# Patient Record
Sex: Male | Born: 1960
Health system: Southern US, Community
[De-identification: ages and names within clinical notes are randomized; demographics above are authoritative.]

## PROBLEM LIST (undated history)

## (undated) DIAGNOSIS — E119 Type 2 diabetes mellitus without complications: Secondary | ICD-10-CM

## (undated) DIAGNOSIS — I509 Heart failure, unspecified: Secondary | ICD-10-CM

## (undated) DIAGNOSIS — J189 Pneumonia, unspecified organism: Secondary | ICD-10-CM

## (undated) HISTORY — PX: FRACTURE SURGERY: SHX138

---

## 2001-03-24 ENCOUNTER — Ambulatory Visit (HOSPITAL_COMMUNITY): Admission: RE | Admit: 2001-03-24 | Discharge: 2001-03-24 | Payer: Self-pay | Admitting: Pulmonary Disease

## 2001-04-14 ENCOUNTER — Ambulatory Visit (HOSPITAL_COMMUNITY): Admission: RE | Admit: 2001-04-14 | Discharge: 2001-04-14 | Payer: Self-pay | Admitting: Pulmonary Disease

## 2006-03-14 ENCOUNTER — Emergency Department (HOSPITAL_COMMUNITY): Admission: EM | Admit: 2006-03-14 | Discharge: 2006-03-14 | Payer: Self-pay | Admitting: Emergency Medicine

## 2006-03-20 ENCOUNTER — Ambulatory Visit: Payer: Self-pay | Admitting: Orthopedic Surgery

## 2008-07-09 ENCOUNTER — Emergency Department (HOSPITAL_COMMUNITY): Admission: EM | Admit: 2008-07-09 | Discharge: 2008-07-09 | Payer: Self-pay | Admitting: Emergency Medicine

## 2008-07-12 ENCOUNTER — Ambulatory Visit: Payer: Self-pay | Admitting: Orthopedic Surgery

## 2008-07-12 ENCOUNTER — Encounter (INDEPENDENT_AMBULATORY_CARE_PROVIDER_SITE_OTHER): Payer: Self-pay | Admitting: *Deleted

## 2008-07-12 DIAGNOSIS — S46819A Strain of other muscles, fascia and tendons at shoulder and upper arm level, unspecified arm, initial encounter: Secondary | ICD-10-CM

## 2008-07-12 DIAGNOSIS — S43499A Other sprain of unspecified shoulder joint, initial encounter: Secondary | ICD-10-CM

## 2008-08-06 ENCOUNTER — Encounter: Payer: Self-pay | Admitting: Orthopedic Surgery

## 2009-06-24 ENCOUNTER — Emergency Department (HOSPITAL_COMMUNITY): Admission: EM | Admit: 2009-06-24 | Discharge: 2009-06-24 | Payer: Self-pay | Admitting: Emergency Medicine

## 2011-09-08 ENCOUNTER — Encounter (HOSPITAL_COMMUNITY): Payer: Self-pay

## 2011-09-08 ENCOUNTER — Emergency Department (HOSPITAL_COMMUNITY)
Admission: EM | Admit: 2011-09-08 | Discharge: 2011-09-08 | Disposition: A | Discharge status: Home / Self Care | Payer: Commercial Managed Care - PPO | Attending: Emergency Medicine | Admitting: Emergency Medicine

## 2011-09-08 DIAGNOSIS — K047 Periapical abscess without sinus: Secondary | ICD-10-CM | POA: Insufficient documentation

## 2011-09-08 DIAGNOSIS — K029 Dental caries, unspecified: Secondary | ICD-10-CM | POA: Insufficient documentation

## 2011-09-08 DIAGNOSIS — F17200 Nicotine dependence, unspecified, uncomplicated: Secondary | ICD-10-CM | POA: Insufficient documentation

## 2011-09-08 DIAGNOSIS — Z880 Allergy status to penicillin: Secondary | ICD-10-CM | POA: Insufficient documentation

## 2011-09-08 MED ORDER — HYDROCODONE-ACETAMINOPHEN 5-325 MG PO TABS: ORAL_TABLET | ORAL | Status: DC

## 2011-09-08 MED ORDER — CLINDAMYCIN HCL 150 MG PO CAPS
300.0000 mg | ORAL_CAPSULE | Freq: Once | ORAL | Status: AC
  Administered 2011-09-08: 300 mg via ORAL
  Filled 2011-09-08: qty 2

## 2011-09-08 MED ORDER — CLINDAMYCIN HCL 150 MG PO CAPS: 300.0000 mg | ORAL_CAPSULE | Freq: Three times a day (TID) | ORAL | Status: AC

## 2011-09-08 MED ORDER — IBUPROFEN 800 MG PO TABS
800.0000 mg | ORAL_TABLET | Freq: Once | ORAL | Status: AC
  Administered 2011-09-08: 800 mg via ORAL
  Filled 2011-09-08: qty 1

## 2011-09-08 NOTE — ED Notes (Signed)
Pt reports that he had a tooth break off this week, face now swollen. On right side.

## 2011-09-08 NOTE — ED Provider Notes (Signed)
Medical screening examination/treatment/procedure(s) were performed by non-physician practitioner and as supervising physician I was immediately available for consultation/collaboration.  Hurman Horn, MD 09/08/11 [phone]

## 2011-09-08 NOTE — ED Provider Notes (Signed)
History     CSN: [id number]  Arrival date & time 09/08/11  4098   First MD Initiated Contact with Patient 09/08/11 1010      Chief Complaint  Patient presents with  . Dental Pain    (Consider location/radiation/quality/duration/timing/severity/associated sxs/prior treatment) HPI Comments: States large cavity in R lower molar fell out some time ago.  Had portion of the tooth break in the last week.  Noted minimal swelling yest to R jaw.  Worse this AM.  States pain is minimal.  No fever.  No dentist.  Patient is a 51 y.o. male presenting with tooth pain. The history is provided by the patient. No language interpreter was used.  Dental PainThe primary symptoms include mouth pain. Primary symptoms do not include headaches or fever. The symptoms began yesterday. The symptoms are worsening. The symptoms occur constantly.  Additional symptoms do not include: trouble swallowing.    History reviewed. No pertinent past medical history.  Past Surgical History  Procedure Date  . Fracture surgery     No family history on file.  History  Substance Use Topics  . Smoking status: Current Everyday Smoker    Types: Cigars  . Smokeless tobacco: Not on file  . Alcohol Use: No      Review of Systems  Constitutional: Negative for fever.  HENT: Positive for dental problem. Negative for trouble swallowing.   Cardiovascular: Negative for chest pain.  Neurological: Negative for headaches.  All other systems reviewed and are negative.    Allergies  Penicillins  Home Medications   Current Outpatient Rx  Name Route Sig Dispense Refill  . CLINDAMYCIN HCL 150 MG PO CAPS Oral Take 2 capsules (300 mg total) by mouth 3 (three) times daily. 60 capsule 0  . HYDROCODONE-ACETAMINOPHEN 5-325 MG PO TABS  One tab po q 4-6 hrs prn pain 20 tablet 0    BP 170/91  Pulse 102  Temp 98.7 F (37.1 C) (Oral)  Resp 20  Ht 6' (1.829 m)  Wt 225 lb (102.059 kg)  BMI 30.52 kg/m2  SpO2 99%  Physical  Exam  Nursing note and vitals reviewed. Constitutional: He is oriented to person, place, and time. He appears well-developed and well-nourished.  HENT:  Head: Normocephalic and atraumatic.    Mouth/Throat: Uvula is midline, oropharynx is clear and moist and mucous membranes are normal. Dental abscesses and dental caries present. No uvula swelling.    Eyes: EOM are normal.  Neck: Normal range of motion.  Cardiovascular: Normal rate, regular rhythm, normal heart sounds and intact distal pulses.   Pulmonary/Chest: Effort normal and breath sounds normal. No respiratory distress.  Abdominal: Soft. He exhibits no distension. There is no tenderness.  Musculoskeletal: Normal range of motion.  Neurological: He is alert and oriented to person, place, and time.  Skin: Skin is warm and dry.  Psychiatric: He has a normal mood and affect. Judgment normal.    ED Course  Procedures (including critical care time)  Labs Reviewed - No data to display No results found.   1. Dental abscess       MDM  rx-clindamycin 300 mg TID x 10 days rx-hydrocodone, 20 Ibuprofen 800 mg F/u dentist        Evalina Field, PA 09/08/11 1050

## 2018-03-24 DIAGNOSIS — J181 Lobar pneumonia, unspecified organism: Secondary | ICD-10-CM | POA: Diagnosis not present

## 2018-04-09 ENCOUNTER — Inpatient Hospital Stay (HOSPITAL_COMMUNITY)
Admission: EM | Admit: 2018-04-09 | Discharge: 2018-04-12 | DRG: 193 | Disposition: A | Discharge status: Home / Self Care | Payer: 59 | Source: Ambulatory Visit | Attending: Internal Medicine | Admitting: Internal Medicine

## 2018-04-09 ENCOUNTER — Emergency Department (HOSPITAL_COMMUNITY): Payer: 59

## 2018-04-09 ENCOUNTER — Other Ambulatory Visit: Payer: Self-pay

## 2018-04-09 ENCOUNTER — Encounter (HOSPITAL_COMMUNITY): Payer: Self-pay | Admitting: Emergency Medicine

## 2018-04-09 DIAGNOSIS — Z880 Allergy status to penicillin: Secondary | ICD-10-CM

## 2018-04-09 DIAGNOSIS — D696 Thrombocytopenia, unspecified: Secondary | ICD-10-CM

## 2018-04-09 DIAGNOSIS — J849 Interstitial pulmonary disease, unspecified: Secondary | ICD-10-CM

## 2018-04-09 DIAGNOSIS — J441 Chronic obstructive pulmonary disease with (acute) exacerbation: Secondary | ICD-10-CM | POA: Diagnosis present

## 2018-04-09 DIAGNOSIS — E871 Hypo-osmolality and hyponatremia: Secondary | ICD-10-CM | POA: Diagnosis present

## 2018-04-09 DIAGNOSIS — B9781 Human metapneumovirus as the cause of diseases classified elsewhere: Secondary | ICD-10-CM | POA: Diagnosis present

## 2018-04-09 DIAGNOSIS — R000 Tachycardia, unspecified: Secondary | ICD-10-CM | POA: Diagnosis not present

## 2018-04-09 DIAGNOSIS — R05 Cough: Secondary | ICD-10-CM | POA: Diagnosis not present

## 2018-04-09 DIAGNOSIS — J9601 Acute respiratory failure with hypoxia: Secondary | ICD-10-CM | POA: Diagnosis not present

## 2018-04-09 DIAGNOSIS — Z720 Tobacco use: Secondary | ICD-10-CM

## 2018-04-09 DIAGNOSIS — R0602 Shortness of breath: Secondary | ICD-10-CM | POA: Diagnosis not present

## 2018-04-09 DIAGNOSIS — J440 Chronic obstructive pulmonary disease with acute lower respiratory infection: Secondary | ICD-10-CM | POA: Diagnosis present

## 2018-04-09 DIAGNOSIS — F17290 Nicotine dependence, other tobacco product, uncomplicated: Secondary | ICD-10-CM | POA: Diagnosis present

## 2018-04-09 DIAGNOSIS — J189 Pneumonia, unspecified organism: Secondary | ICD-10-CM | POA: Diagnosis not present

## 2018-04-09 DIAGNOSIS — Z03818 Encounter for observation for suspected exposure to other biological agents ruled out: Secondary | ICD-10-CM | POA: Diagnosis not present

## 2018-04-09 DIAGNOSIS — J1289 Other viral pneumonia: Principal | ICD-10-CM | POA: Diagnosis present

## 2018-04-09 DIAGNOSIS — E869 Volume depletion, unspecified: Secondary | ICD-10-CM | POA: Diagnosis present

## 2018-04-09 DIAGNOSIS — E875 Hyperkalemia: Secondary | ICD-10-CM

## 2018-04-09 DIAGNOSIS — Z20828 Contact with and (suspected) exposure to other viral communicable diseases: Secondary | ICD-10-CM

## 2018-04-09 LAB — COMPREHENSIVE METABOLIC PANEL
ALBUMIN: 3.5 g/dL (ref 3.5–5.0)
ALT: 35 U/L (ref 0–44)
AST: 39 U/L (ref 15–41)
Alkaline Phosphatase: 74 U/L (ref 38–126)
Anion gap: 10 (ref 5–15)
BUN: 23 mg/dL — AB (ref 6–20)
CHLORIDE: 96 mmol/L — AB (ref 98–111)
CO2: 24 mmol/L (ref 22–32)
CREATININE: 1.02 mg/dL (ref 0.61–1.24)
Calcium: 8.5 mg/dL — ABNORMAL LOW (ref 8.9–10.3)
GFR calc Af Amer: >60 mL/min (ref >60)
GFR calc non Af Amer: >60 mL/min (ref >60)
GLUCOSE: 161 mg/dL — AB (ref 70–99)
Potassium: 5.6 mmol/L — ABNORMAL HIGH (ref 3.5–5.1)
Sodium: 130 mmol/L — ABNORMAL LOW (ref 135–145)
Total Bilirubin: 0.7 mg/dL (ref 0.3–1.2)
Total Protein: 6.8 g/dL (ref 6.5–8.1)

## 2018-04-09 LAB — RESPIRATORY PANEL BY PCR
Adenovirus: NOT DETECTED
Bordetella pertussis: NOT DETECTED
CORONAVIRUS OC43-RVPPCR: NOT DETECTED
Chlamydophila pneumoniae: NOT DETECTED
Coronavirus 229E: NOT DETECTED
Coronavirus HKU1: NOT DETECTED
Coronavirus NL63: NOT DETECTED
INFLUENZA A-RVPPCR: NOT DETECTED
Influenza B: NOT DETECTED
METAPNEUMOVIRUS-RVPPCR: DETECTED — AB
Mycoplasma pneumoniae: NOT DETECTED
PARAINFLUENZA VIRUS 1-RVPPCR: NOT DETECTED
PARAINFLUENZA VIRUS 2-RVPPCR: NOT DETECTED
PARAINFLUENZA VIRUS 3-RVPPCR: NOT DETECTED
PARAINFLUENZA VIRUS 4-RVPPCR: NOT DETECTED
RESPIRATORY SYNCYTIAL VIRUS-RVPPCR: NOT DETECTED
RHINOVIRUS / ENTEROVIRUS - RVPPCR: NOT DETECTED

## 2018-04-09 LAB — INFLUENZA PANEL BY PCR (TYPE A & B)
Influenza A By PCR: NEGATIVE
Influenza B By PCR: NEGATIVE

## 2018-04-09 LAB — CBC WITH DIFFERENTIAL/PLATELET
Abs Immature Granulocytes: 0.05 10*3/uL (ref 0.00–0.07)
BASOS ABS: 0.1 10*3/uL (ref 0.0–0.1)
BASOS PCT: 1 %
EOS PCT: 1 %
Eosinophils Absolute: 0 10*3/uL (ref 0.0–0.5)
HCT: 53 % — ABNORMAL HIGH (ref 39.0–52.0)
HEMOGLOBIN: 17.8 g/dL — AB (ref 13.0–17.0)
Immature Granulocytes: 1 %
LYMPHS PCT: 16 %
Lymphs Abs: 1.2 10*3/uL (ref 0.7–4.0)
MCH: 31.2 pg (ref 26.0–34.0)
MCHC: 33.6 g/dL (ref 30.0–36.0)
MCV: 93 fL (ref 80.0–100.0)
Monocytes Absolute: 0.5 10*3/uL (ref 0.1–1.0)
Monocytes Relative: 6 %
NRBC: 0 % (ref 0.0–0.2)
Neutro Abs: 5.9 10*3/uL (ref 1.7–7.7)
Neutrophils Relative %: 75 %
PLATELETS: 143 10*3/uL — AB (ref 150–400)
RBC: 5.7 MIL/uL (ref 4.22–5.81)
RDW: 13.2 % (ref 11.5–15.5)
WBC: 7.8 10*3/uL (ref 4.0–10.5)

## 2018-04-09 LAB — LACTATE DEHYDROGENASE: LDH: 222 U/L — ABNORMAL HIGH (ref 98–192)

## 2018-04-09 LAB — LACTIC ACID, PLASMA: LACTIC ACID, VENOUS: 1.8 mmol/L (ref 0.5–1.9)

## 2018-04-09 LAB — C-REACTIVE PROTEIN: CRP: 4.2 mg/dL — AB (ref <1.0)

## 2018-04-09 MED ORDER — AZITHROMYCIN 500 MG IV SOLR
500.0000 mg | Freq: Once | INTRAVENOUS | Status: AC
  Administered 2018-04-09: 500 mg via INTRAVENOUS
  Filled 2018-04-09: qty 500

## 2018-04-09 MED ORDER — ONDANSETRON HCL 4 MG/2ML IJ SOLN
4.0000 mg | Freq: Once | INTRAMUSCULAR | Status: AC
  Administered 2018-04-09: 4 mg via INTRAVENOUS
  Filled 2018-04-09: qty 2

## 2018-04-09 MED ORDER — ALBUTEROL SULFATE HFA 108 (90 BASE) MCG/ACT IN AERS
2.0000 | INHALATION_SPRAY | Freq: Three times a day (TID) | RESPIRATORY_TRACT | Status: DC
  Administered 2018-04-10 – 2018-04-12 (×6): 2 via RESPIRATORY_TRACT
  Filled 2018-04-09: qty 6.7

## 2018-04-09 MED ORDER — ACETAMINOPHEN 325 MG PO TABS
650.0000 mg | ORAL_TABLET | Freq: Four times a day (QID) | ORAL | Status: DC | PRN
  Administered 2018-04-09: 650 mg via ORAL
  Filled 2018-04-09: qty 2

## 2018-04-09 MED ORDER — ONDANSETRON HCL 4 MG PO TABS: 4.0000 mg | ORAL_TABLET | Freq: Four times a day (QID) | ORAL | Status: DC | PRN

## 2018-04-09 MED ORDER — IPRATROPIUM BROMIDE HFA 17 MCG/ACT IN AERS
2.0000 | INHALATION_SPRAY | Freq: Four times a day (QID) | RESPIRATORY_TRACT | Status: DC
  Administered 2018-04-09: 2 via RESPIRATORY_TRACT
  Filled 2018-04-09: qty 12.9

## 2018-04-09 MED ORDER — IPRATROPIUM BROMIDE HFA 17 MCG/ACT IN AERS
2.0000 | INHALATION_SPRAY | Freq: Three times a day (TID) | RESPIRATORY_TRACT | Status: DC
  Administered 2018-04-10 – 2018-04-12 (×6): 2 via RESPIRATORY_TRACT
  Filled 2018-04-09: qty 12.9

## 2018-04-09 MED ORDER — ONDANSETRON HCL 4 MG/2ML IJ SOLN: 4.0000 mg | Freq: Four times a day (QID) | INTRAMUSCULAR | Status: DC | PRN

## 2018-04-09 MED ORDER — ENOXAPARIN SODIUM 40 MG/0.4ML ~~LOC~~ SOLN
40.0000 mg | SUBCUTANEOUS | Status: DC
  Administered 2018-04-09 – 2018-04-11 (×3): 40 mg via SUBCUTANEOUS
  Filled 2018-04-09 (×3): qty 0.4

## 2018-04-09 MED ORDER — SODIUM CHLORIDE 0.9 % IV SOLN
INTRAVENOUS | Status: DC
  Administered 2018-04-09 – 2018-04-11 (×4): via INTRAVENOUS

## 2018-04-09 MED ORDER — CEFTRIAXONE SODIUM 1 G IJ SOLR
1.0000 g | Freq: Once | INTRAMUSCULAR | Status: AC
  Administered 2018-04-09: 1 g via INTRAVENOUS
  Filled 2018-04-09: qty 10

## 2018-04-09 MED ORDER — ALBUTEROL SULFATE HFA 108 (90 BASE) MCG/ACT IN AERS
2.0000 | INHALATION_SPRAY | RESPIRATORY_TRACT | Status: DC | PRN
  Filled 2018-04-09: qty 6.7

## 2018-04-09 NOTE — ED Notes (Signed)
ED Provider at bedside. 

## 2018-04-09 NOTE — ED Triage Notes (Signed)
PT went to urgent care on 03/24/2018 and was dx with pneumonia (patchy bilateral lower lob airspace consolidation, left pleural effusion) and was started on Levaquin x 10 days, and prednisone and told to take OTC cough medications. Cough has not resolved today and pt states he feels generalized weakness, SOB with exertion. PT returned to Urgent Care this am and was told to come to ED bc Levaquin is the strongest antibiotic they could prescribed and he may need IV antibiotics.

## 2018-04-09 NOTE — H&P (Addendum)
History and Physical  DONTREZ PETTIS NOM:[id number] DOB: [birth year] DOA: 04/09/2018   PCP: Patient, No Pcp Per   Patient coming from: Home  Chief Complaint: cough, dyspnea  HPI:  SHEILA GERVASI is a 58 y.o. male with no documented chronic medical problems except for tobacco use presenting with 2 to 3-week history of coughing and shortness of breath.  The patient states that he smokes approximately 40 cigars on a weekly basis for the past 30 years.  The patient had some subjective fevers and chills at home.  He went to urgent care on 03/24/2018.  Per the patient, the patient had an abnormal chest x-ray showing infiltrates.  He was started on levofloxacin with which he was compliant.  The patient took 10 days worth of therapy.  Initially, the patient felt somewhat better, but in the past 3 to 4 days, he has had progressive shortness of breath and coughing with occasional blood-tinged sputum.  He denies any nausea, vomiting, diarrhea, headache, sore throat, abdominal pain, chest pain.  He recently traveled to [place] within the past month.  He has had no other travels.  He denies any sick contacts that he was aware of with flulike symptoms.  He normally lives by himself. In the emergency department, the patient had a temperature of 100.2 F.  He was tachycardic with a heart rate 120-130, but was hemodynamically stable.  The patient oxygen saturation 83-84% on room air.  He was placed on 3 L nasal cannula with saturation 94-95%.  The patient was started on ceftriaxone and azithromycin.  WBC was 7.8 with platelets 143,000.  BMP showed a sodium 130 with potassium 5.6 and serum creatinine 1.02.  AST 39, ALT 35, alkaline phosphatase 74, total bilirubin 0.7.  Lactic acid 1.8  Assessment/Plan: Acute respiratory failure with hypoxia -Secondary to interstitial pneumonitis -Currently stable on 3 L -Wean oxygen for saturation greater than 92%  Interstitial pneumonitis -Personally reviewed chest  x-ray--bilateral interstitial infiltrates, right greater than left -Viral respiratory panel -Influenza PCR -Check COVID-19 NAA -as pt is considered "low risk", place patient in droplet isolation -Check procalcitonin -Check LDH -Check CRP -Daily CBC with differential -Follow blood cultures -albuterol and atrovent MDI  Thrombocytopenia -Likely due to viral pneumonitis -Daily CBC with differential -Monitor for signs of bleeding -HIV -Serum B12  Hyponatremia -Secondary to volume depletion and poor solute intake -Start IV normal saline -Patient relates poor p.o. intake x1 week  Hyperkalemia -Anticipate improvement with IV fluids -Place on telemetry -A.m. BMP  Tobacco abuse -I have discussed tobacco cessation with the patient.  I have counseled the patient regarding the negative impacts of continued tobacco use including but not limited to lung cancer, COPD, and cardiovascular disease.  I have discussed alternatives to tobacco and modalities that may help facilitate tobacco cessation including but not limited to biofeedback, hypnosis, and medications.  Total time spent with tobacco counseling was 4 minutes.  Personal history of PCN allergy -causes N/V -tolerated ceftriaxone    History reviewed. No pertinent past medical history. Past Surgical History:  Procedure Laterality Date  . FRACTURE SURGERY     Social History:  reports that he has been smoking cigars. He has never used smokeless tobacco. He reports that he does not drink alcohol or use drugs.   History reviewed. No pertinent family history.   Allergies  Allergen Reactions  . Penicillins Other (See Comments)    "[name]" - STATES THIS OCCURS WITH ALL  "[name]"  Prior to Admission medications   Medication Sig Start Date End Date Taking? Authorizing Provider  acetaminophen (TYLENOL) 500 MG tablet Take 1,000 mg by mouth every 6 (six) hours as needed. For pain    [provider]   Aspirin-Salicylamide-Caffeine (BC HEADACHE POWDER PO) Take 1-2 packets by mouth daily as needed. For headache/pain    [provider]  HYDROcodone-acetaminophen (NORCO/VICODIN) 5-325 MG per tablet One tab po q 4-6 hrs prn pain 09/08/11   Duaine Dredge, PA-C  ibuprofen (ADVIL,MOTRIN) 200 MG tablet Take 600 mg by mouth every 6 (six) hours as needed. For pain    [provider]  levofloxacin (LEVAQUIN) 500 MG tablet Take 500 mg by mouth daily. 03/24/18   [provider]  predniSONE (STERAPRED UNI-PAK 21 TAB) 5 MG (21) TBPK tablet Take 1 tablet by mouth as directed. 6tabs daily then decrease by one daily for days 03/24/18   [provider]    Review of Systems:  Constitutional:  No weight loss, night sweats, Fevers, chills, fatigue.  Head&Eyes: No headache.  No vision loss.  No eye pain or scotoma ENT:  No Difficulty swallowing,Tooth/dental problems,Sore throat,  No ear ache, post nasal drip,  Cardio-vascular:  No chest pain, Orthopnea, PND, swelling in lower extremities,  dizziness, palpitations  GI:  No  abdominal pain, nausea, vomiting, diarrhea, loss of appetite, hematochezia, melena, heartburn, indigestion, Resp:  .No wheezing.No chest wall deformity  Skin:  no rash or lesions.  GU:  no dysuria, change in color of urine, no urgency or frequency. No flank pain.  Musculoskeletal:  No joint pain or swelling. No decreased range of motion. No back pain.  Psych:  No change in mood or affect. No depression or anxiety. Neurologic: No headache, no dysesthesia, no focal weakness, no vision loss. No syncope  Physical Exam: Vitals:   04/09/18 1250 04/09/18 1310 04/09/18 1400 04/09/18 1430  BP:   (!) 123/98 120/84  Pulse: (!) 123 (!) 126 (!) 130 (!) 129  Resp: 18 (!) 23 (!) 31 (!) 23  Temp:      TempSrc:      SpO2:   (!) 83%   Weight:      Height:       General:  A&O x 3, NAD, nontoxic, pleasant/cooperative Head/Eye: No conjunctival hemorrhage, no  icterus, [place]/AT, No nystagmus ENT:  No icterus,  No thrush, good dentition, no pharyngeal exudate Neck:  No masses, no lymphadenpathy, no bruits CV:  RRR, no rub, no gallop, no S3 Lung:  Bilateral scattered rales.  No wheeze Abdomen: soft/NT, +BS, nondistended, no peritoneal signs Ext: No cyanosis, No rashes, No petechiae, No lymphangitis, No edema Neuro: CNII-XII intact, strength 4/5 in bilateral upper and lower extremities, no dysmetria  Labs on Admission:  Basic Metabolic Panel: Recent Labs  Lab 04/09/18 1210  NA 130*  K 5.6*  CL 96*  CO2 24  GLUCOSE 161*  BUN 23*  CREATININE 1.02  CALCIUM 8.5*   Liver Function Tests: Recent Labs  Lab 04/09/18 1210  AST 39  ALT 35  ALKPHOS 74  BILITOT 0.7  PROT 6.8  ALBUMIN 3.5   No results for input(s): LIPASE, AMYLASE in the last 168 hours. No results for input(s): AMMONIA in the last 168 hours. CBC: Recent Labs  Lab 04/09/18 1116  WBC 7.8  NEUTROABS 5.9  HGB 17.8*  HCT 53.0*  MCV 93.0  PLT 143*   Coagulation Profile: No results for input(s): INR, PROTIME in the last 168 hours.  Cardiac Enzymes: No results for input(s): CKTOTAL, CKMB, CKMBINDEX, TROPONINI in the last 168 hours. BNP: Invalid input(s): POCBNP CBG: No results for input(s): GLUCAP in the last 168 hours. Urine analysis: No results found for: COLORURINE, APPEARANCEUR, LABSPEC, PHURINE, GLUCOSEU, HGBUR, BILIRUBINUR, KETONESUR, [place], UROBILINOGEN, NITRITE, LEUKOCYTESUR Sepsis Labs: @LABRCNTIP (procalcitonin:4,lacticidven:4) ) Recent Results (from the past 240 hour(s))  Blood culture (routine x 2)     Status: None (Preliminary result)   Collection Time: 04/09/18 11:00 AM  Result Value Ref Range Status   Specimen Description BLOOD LEFT ARM  Final   Special Requests   Final    BOTTLES DRAWN AEROBIC AND ANAEROBIC Blood Culture adequate volume Performed at [facility], [address], [place], [place] [address]    Culture PENDING  Incomplete   Report  Status PENDING  Incomplete  Blood culture (routine x 2)     Status: None (Preliminary result)   Collection Time: 04/09/18 11:45 AM  Result Value Ref Range Status   Specimen Description BLOOD LEFT ARM  Final   Special Requests   Final    BOTTLES DRAWN AEROBIC AND ANAEROBIC Blood Culture adequate volume Performed at [facility], [address] [place], [place] [address]    Culture PENDING  Incomplete   Report Status PENDING  Incomplete     Radiological Exams on Admission: Dg Chest 2 View  Result Date: 04/09/2018 CLINICAL DATA:  Cough and congestion.  Shortness of breath. EXAM: CHEST - 2 VIEW COMPARISON:  None. FINDINGS: There is interstitial and patchy airspace opacity throughout both perihilar regions with involvement of portions of the right middle lobe as well as anterior segments of each upper lobe and a small portion of the left lower lobe. Heart size and pulmonary vascular normal. No adenopathy. There is degenerative change in the lower thoracic spine as well as in each shoulder. There is mild anterior wedging of several lower thoracic vertebral bodies. IMPRESSION: Interstitial and patchy airspace opacity bilaterally, more on the right than on the left. Suspect multifocal pneumonia. Atypical pneumonia must be a consideration given this appearance. Viral pneumonitis is quite possible in this circumstance. No adenopathy appreciable. Anterior wedging of several lower thoracic vertebral bodies noted. Electronically Signed   By: Lowella Grip III M.D.   On: 04/09/2018 11:04    EKG: Independently reviewed. Sinus tachycardia, nonspecific T wave changes  PPE worn:  Bouffant cap                     Face shield                     N95 mask                     Gown and gloves    Time spent:60 minutes Code Status:   FULL Family Communication:  No Family at bedside Disposition Plan: expect 1-2 day hospitalization Consults called: none DVT Prophylaxis: Burt Lovenox  Orson Eva, DO  [facility] Pager [phone]  If 7PM-7AM, please contact night-coverage [email or web address] Password [facility] 04/09/2018, 2:46 PM

## 2018-04-09 NOTE — ED Provider Notes (Signed)
[facility] EMERGENCY DEPARTMENT Provider Note   CSN: [id number] Arrival date & time: 04/09/18  5009    History   Chief Complaint Chief Complaint  Patient presents with   Cough    HPI Fernando Williams is a 58 y.o. male with no significant past medical history presenting with suspected bilateral pneumonia.  He was seen at an urgent care center here locally on March 9 and was diagnosed with bilateral lower pneumonia with a suggestion of a left pleural effusion by that x-ray, was placed on Levaquin 10-day course which he has finished, felt better for several days although the cough had never completely resolved.  Over the past 3 days he has had increasing cough, generalized fatigue and shortness of breath which was worsened by exertion.  He denies chest pain.  He returned to the urgent care center this morning and they promptly sent him here.  He has had subjective fever but nothing documented prior today where he is 100.2 upon arrival.  He is a 1 pack/day smoker, no known exposure to Covid 19 and no recent travel.  He has had no medications this morning prior to arrival.     The history is provided by the patient.  Cough  Associated symptoms: fever and shortness of breath     History reviewed. No pertinent past medical history.  Patient Active Problem List   Diagnosis Date Noted   Acute respiratory failure with hypoxia (Fernando Williams) 04/09/2018   SPRAIN&STRAIN OTH SPEC SITES SHOULDER&UPPER ARM 07/12/2008    Past Surgical History:  Procedure Laterality Date   FRACTURE SURGERY          Home Medications    Prior to Admission medications   Medication Sig Start Date End Date Taking? Authorizing Provider  acetaminophen (TYLENOL) 500 MG tablet Take 1,000 mg by mouth every 6 (six) hours as needed. For pain    [provider]  Aspirin-Salicylamide-Caffeine (BC HEADACHE POWDER PO) Take 1-2 packets by mouth daily as needed. For headache/pain    [provider]    HYDROcodone-acetaminophen (NORCO/VICODIN) 5-325 MG per tablet One tab po q 4-6 hrs prn pain 09/08/11   Duaine Dredge, PA-C  ibuprofen (ADVIL,MOTRIN) 200 MG tablet Take 600 mg by mouth every 6 (six) hours as needed. For pain    [provider]  levofloxacin (LEVAQUIN) 500 MG tablet Take 500 mg by mouth daily. 03/24/18   [provider]  predniSONE (STERAPRED UNI-PAK 21 TAB) 5 MG (21) TBPK tablet Take 1 tablet by mouth as directed. 6tabs daily then decrease by one daily for days 03/24/18   [provider]    Family History History reviewed. No pertinent family history.  Social History Social History   Tobacco Use   Smoking status: Current Every Day Smoker    Types: Cigars   Smokeless tobacco: Never Used  Substance Use Topics   Alcohol use: No   Drug use: No     Allergies   Penicillins   Review of Systems Review of Systems  Constitutional: Positive for fatigue and fever.  HENT: Negative.   Respiratory: Positive for cough and shortness of breath.   Cardiovascular: Negative.   Gastrointestinal: Negative.   Genitourinary: Negative.   Musculoskeletal: Negative.      Physical Exam Updated Vital Signs BP (!) 123/98    Pulse (!) 130 Comment: Simultaneous filing. User may not have seen previous data.   Temp 100.2 F (37.9 C) (Oral)    Resp (!) 31 Comment: Simultaneous filing.  User may not have seen previous data.   Ht 5\' 10"  (1.778 m)    Wt 104.3 kg    SpO2 (!) 83%    BMI 33.00 kg/m   Physical Exam Vitals signs and nursing note reviewed.  Constitutional:      Appearance: He is well-developed.  HENT:     Head: Normocephalic and atraumatic.  Eyes:     Conjunctiva/sclera: Conjunctivae normal.  Neck:     Musculoskeletal: Normal range of motion.  Cardiovascular:     Rate and Rhythm: Normal rate and regular rhythm.     Heart sounds: Normal heart sounds.  Pulmonary:     Effort: Respiratory distress present.     Breath sounds: Rhonchi present.  No wheezing.     Comments: Patient is borderline tachypneic.  No wheezing appreciated.  He does have bilateral crackles. Abdominal:     General: Bowel sounds are normal.     Palpations: Abdomen is soft.     Tenderness: There is no abdominal tenderness.  Musculoskeletal: Normal range of motion.  Skin:    General: Skin is warm and dry.  Neurological:     Mental Status: He is alert.      ED Treatments / Results  Labs (all labs ordered are listed, but only abnormal results are displayed) Labs Reviewed  CBC WITH DIFFERENTIAL/PLATELET - Abnormal; Notable for the following components:      Result Value   Hemoglobin 17.8 (*)    HCT 53.0 (*)    Platelets 143 (*)    All other components within normal limits  COMPREHENSIVE METABOLIC PANEL - Abnormal; Notable for the following components:   Sodium 130 (*)    Potassium 5.6 (*)    Chloride 96 (*)    Glucose, Bld 161 (*)    BUN 23 (*)    Calcium 8.5 (*)    All other components within normal limits  CULTURE, BLOOD (ROUTINE X 2)  CULTURE, BLOOD (ROUTINE X 2)  RESPIRATORY PANEL BY PCR  NOVEL CORONAVIRUS, NAA (HOSPITAL ORDER, SEND-OUT TO REF LAB)  LACTIC ACID, PLASMA  INFLUENZA PANEL BY PCR (TYPE A & B)    EKG EKG Interpretation  Date/Time:  Wednesday April 09 2018 10:32:34 EDT Ventricular Rate:  117 PR Interval:    QRS Duration: 85 QT Interval:  326 QTC Calculation: 455 R Axis:   -40 Text Interpretation:  Sinus tachycardia Ventricular premature complex Probable left atrial enlargement Left axis deviation Anteroseptal infarct, old Baseline wander No old tracing to compare Confirmed by Francine Graven [id number]) on 04/09/2018 10:53:05 AM   Radiology Dg Chest 2 View  Result Date: 04/09/2018 CLINICAL DATA:  Cough and congestion.  Shortness of breath. EXAM: CHEST - 2 VIEW COMPARISON:  None. FINDINGS: There is interstitial and patchy airspace opacity throughout both perihilar regions with involvement of portions of the right middle  lobe as well as anterior segments of each upper lobe and a small portion of the left lower lobe. Heart size and pulmonary vascular normal. No adenopathy. There is degenerative change in the lower thoracic spine as well as in each shoulder. There is mild anterior wedging of several lower thoracic vertebral bodies. IMPRESSION: Interstitial and patchy airspace opacity bilaterally, more on the right than on the left. Suspect multifocal pneumonia. Atypical pneumonia must be a consideration given this appearance. Viral pneumonitis is quite possible in this circumstance. No adenopathy appreciable. Anterior wedging of several lower thoracic vertebral bodies noted. Electronically Signed   By: Lowella Grip III M.D.  On: 04/09/2018 11:04    Procedures Procedures (including critical care time)  Medications Ordered in ED Medications  0.9 %  sodium chloride infusion (has no administration in time range)  cefTRIAXone (ROCEPHIN) 1 g in sodium chloride 0.9 % 100 mL IVPB (0 g Intravenous Stopped 04/09/18 1247)  azithromycin (ZITHROMAX) 500 mg in sodium chloride 0.9 % 250 mL IVPB (0 mg Intravenous Stopped 04/09/18 1344)  ondansetron (ZOFRAN) injection 4 mg (4 mg Intravenous Given 04/09/18 1403)     Initial Impression / Assessment and Plan / ED Course  I have reviewed the triage vital signs and the nursing notes.  Pertinent labs & imaging results that were available during my care of the patient were reviewed by me and considered in my medical decision making (see chart for details).        Labs and imaging reviewed and discussed with patient who is agreeable for admission for ongoing care.  Discussed with Dr. Carles Collet who will see patient and admit.  Final Clinical Impressions(s) / ED Diagnoses   Final diagnoses:  Community acquired pneumonia, unspecified laterality    ED Discharge Orders    None       Landis Martins 04/09/18 [place], [place], DO 04/14/18 Mirian Capuchin

## 2018-04-09 NOTE — ED Notes (Signed)
Called in to pt's room because he felt nauseous, states he "hasn't felt well since the medicine started ". Denies worsening shortness of breath, states he just feels like he is going to throw up. Provider notified.

## 2018-04-10 DIAGNOSIS — J189 Pneumonia, unspecified organism: Secondary | ICD-10-CM

## 2018-04-10 LAB — CBC WITH DIFFERENTIAL/PLATELET
Abs Immature Granulocytes: 0.05 10*3/uL (ref 0.00–0.07)
Basophils Absolute: 0.1 10*3/uL (ref 0.0–0.1)
Basophils Relative: 1 %
Eosinophils Absolute: 0 10*3/uL (ref 0.0–0.5)
Eosinophils Relative: 0 %
HCT: 51.6 % (ref 39.0–52.0)
Hemoglobin: 16.5 g/dL (ref 13.0–17.0)
Immature Granulocytes: 1 %
Lymphocytes Relative: 21 %
Lymphs Abs: 1.6 10*3/uL (ref 0.7–4.0)
MCH: 31 pg (ref 26.0–34.0)
MCHC: 32 g/dL (ref 30.0–36.0)
MCV: 96.8 fL (ref 80.0–100.0)
Monocytes Absolute: 0.4 10*3/uL (ref 0.1–1.0)
Monocytes Relative: 6 %
Neutro Abs: 5.4 10*3/uL (ref 1.7–7.7)
Neutrophils Relative %: 71 %
Platelets: UNDETERMINED 10*3/uL (ref 150–400)
RBC: 5.33 MIL/uL (ref 4.22–5.81)
RDW: 13.2 % (ref 11.5–15.5)
WBC: 7.5 10*3/uL (ref 4.0–10.5)
nRBC: 0 % (ref 0.0–0.2)

## 2018-04-10 LAB — HIV ANTIBODY (ROUTINE TESTING W REFLEX): HIV Screen 4th Generation wRfx: NONREACTIVE

## 2018-04-10 LAB — TSH: TSH: 0.939 u[IU]/mL (ref 0.350–4.500)

## 2018-04-10 LAB — VITAMIN B12: VITAMIN B 12: 2016 pg/mL — AB (ref 180–914)

## 2018-04-10 LAB — CK: Total CK: 335 U/L (ref 49–397)

## 2018-04-10 LAB — PROCALCITONIN: Procalcitonin: 0.9 ng/mL

## 2018-04-10 LAB — MAGNESIUM: Magnesium: 2.1 mg/dL (ref 1.7–2.4)

## 2018-04-10 LAB — C-REACTIVE PROTEIN: CRP: 3.6 mg/dL — ABNORMAL HIGH (ref <1.0)

## 2018-04-10 LAB — FOLATE: Folate: 27.5 ng/mL (ref >5.9)

## 2018-04-10 MED ORDER — GUAIFENESIN-DM 100-10 MG/5ML PO SYRP
5.0000 mL | ORAL_SOLUTION | ORAL | Status: DC | PRN
  Administered 2018-04-10 – 2018-04-11 (×4): 5 mL via ORAL
  Filled 2018-04-10 (×4): qty 10

## 2018-04-10 MED ORDER — METHYLPREDNISOLONE SODIUM SUCC 125 MG IJ SOLR
80.0000 mg | Freq: Once | INTRAMUSCULAR | Status: AC
  Administered 2018-04-10: 80 mg via INTRAVENOUS
  Filled 2018-04-10: qty 2

## 2018-04-10 MED ORDER — METHYLPREDNISOLONE SODIUM SUCC 125 MG IJ SOLR
80.0000 mg | Freq: Two times a day (BID) | INTRAMUSCULAR | Status: DC
  Administered 2018-04-10 – 2018-04-12 (×4): 80 mg via INTRAVENOUS
  Filled 2018-04-10 (×4): qty 2

## 2018-04-10 MED ORDER — ORAL CARE MOUTH RINSE
15.0000 mL | Freq: Two times a day (BID) | OROMUCOSAL | Status: DC
  Administered 2018-04-10 – 2018-04-11 (×3): 15 mL via OROMUCOSAL

## 2018-04-10 MED ORDER — ADULT MULTIVITAMIN W/MINERALS CH
1.0000 | ORAL_TABLET | Freq: Every day | ORAL | Status: DC
  Administered 2018-04-10 – 2018-04-11 (×2): 1 via ORAL
  Filled 2018-04-10 (×2): qty 1

## 2018-04-10 MED ORDER — ENSURE ENLIVE PO LIQD
237.0000 mL | ORAL | Status: DC
  Administered 2018-04-10 – 2018-04-11 (×2): 237 mL via ORAL

## 2018-04-10 MED ORDER — BENZONATATE 100 MG PO CAPS
200.0000 mg | ORAL_CAPSULE | Freq: Three times a day (TID) | ORAL | Status: DC | PRN
  Administered 2018-04-10 – 2018-04-11 (×4): 200 mg via ORAL
  Filled 2018-04-10 (×4): qty 2

## 2018-04-10 NOTE — Progress Notes (Signed)
Initial Nutrition Assessment  RD working remotely.   DOCUMENTATION CODES:   Obesity unspecified  INTERVENTION:  - Will order Ensure Enlive once/day, each supplement provides 350 kcal and 20 grams of protein. - Will order daily multivitamin with minerals. - Continue to encourage PO intakes.    NUTRITION DIAGNOSIS:   Increased nutrient needs related to acute illness as evidenced by estimated needs.  GOAL:   Patient will meet greater than or equal to 90% of their needs  MONITOR:   PO intake, Supplement acceptance, Weight trends, Labs  REASON FOR ASSESSMENT:   Malnutrition Screening Tool  ASSESSMENT:   58 y.o. male with no documented chronic medical problems except for tobacco use (smokes 40 cigars/week for the past 30 years). He presented to the ED with 2-3 week history of coughing and SOB. He patient had some subjective fevers and chills at home.  He went to urgent care on 3/9 and had an abdominal xray which he reports showed infiltrates. He was prescribed medication and was compliant with the 10 day therapy. He initially felt somewhat better but then worsened in the past 3-4 days PTA.  No intakes documented since admission. Prior to this admission, no weight hx since 2013.   Per Dr. Rodena Piety' note this AM: acute respiratory failure with hypoxia/interstitial pneumonitis, RSV positive for metapneumovirus, flu negative, pending COVID-19 test results   Medications reviewed; 80 mg solu-medrol BID. Labs reviewed; Na: 130 mmol/l, K: 5.6 mmol/l, Cl: 96 mmol/l, BUN: 23 mg/dl, Ca: 8.5 mg/dl. IVF; NS @ 100 ml/hr.      NUTRITION - FOCUSED PHYSICAL EXAM:  Unable to complete at this time.   Diet Order:   Diet Order            Diet regular Room service appropriate? Yes; Fluid consistency: Thin  Diet effective now              EDUCATION NEEDS:   No education needs have been identified at this time  Skin:  Skin Assessment: Reviewed RN Assessment  Last BM:   3/25  Height:   Ht Readings from Last 1 Encounters:  04/09/18 5\' 10"  (1.778 m)    Weight:   Wt Readings from Last 1 Encounters:  04/09/18 104.3 kg    Ideal Body Weight:  75.45 kg  BMI:  Body mass index is 33 kg/m.  Estimated Nutritional Needs:   Kcal:  2090-2295 kcal  Protein:  90-100 grams  Fluid:  >/= 2 L/day     Jarome Matin, MS, RD, LDN, [facility] Inpatient Clinical Dietitian Pager # [phone] After hours/weekend pager # [phone]

## 2018-04-10 NOTE — Progress Notes (Signed)
CRITICAL VALUE ALERT  Critical Value:  [place] 27.5, B12 - 2016  Date & Time Notied:  04/10/18 @ 9068  Provider Notified: Dr Rodena Piety paged via amion  Orders Received/Actions taken: none at this time

## 2018-04-10 NOTE — Progress Notes (Signed)
PROGRESS NOTE    Fernando Williams  GUY:[id number] DOB: 15-Nov-1960 DOA: 04/09/2018 PCP: Patient, No Pcp Per   Brief Narrative:57 y.o. male with no documented chronic medical problems except for tobacco use presenting with 2 to 3-week history of coughing and shortness of breath.  The patient states that he smokes approximately 40 cigars on a weekly basis for the past 30 years.  The patient had some subjective fevers and chills at home.  He went to urgent care on 03/24/2018.  Per the patient, the patient had an abnormal chest x-ray showing infiltrates.  He was started on levofloxacin with which he was compliant.  The patient took 10 days worth of therapy.  Initially, the patient felt somewhat better, but in the past 3 to 4 days, he has had progressive shortness of breath and coughing with occasional blood-tinged sputum.  He denies any nausea, vomiting, diarrhea, headache, sore throat, abdominal pain, chest pain.  He recently traveled to [place] within the past month.  He has had no other travels.  He denies any sick contacts that he was aware of with flulike symptoms.  He normally lives by himself. In the emergency department, the patient had a temperature of 100.2 F.  He was tachycardic with a heart rate 120-130, but was hemodynamically stable.  The patient oxygen saturation 83-84% on room air.  He was placed on 3 L nasal cannula with saturation 94-95%.  The patient was started on ceftriaxone and azithromycin.  WBC was 7.8 with platelets 143,000.  BMP showed a sodium 130 with potassium 5.6 and serum creatinine 1.02.  AST 39, ALT 35, alkaline phosphatase 74, total bilirubin 0.7.  Lactic acid 1.8   Assessment & Plan:   Active Problems:   Acute respiratory failure with hypoxia (HCC)   Interstitial pneumonia (HCC)   Tobacco abuse   Thrombocytopenia (HCC)   Hyperkalemia   Acute respiratory failure with hypoxia/interstitial pneumonitis/COPD exacerbation-RSV positive for metapneumovirus.  Flu a  and B-.  COVID-19 pending.  LDH 222 CRP 3.6 procalcitonin 0.90 lactic acid 1.8 -Secondary to interstitial pneumonitis -Currently  on 3 L saturation 90% -Wean oxygen for saturation greater than 92% Continue MDI -Add Solu-Medrol  Thrombocytopenia -Likely due to viral pneumonitis -Daily CBC with differential -Monitor for signs of bleeding  Hyponatremia -Secondary to volume depletion and poor solute intake -Start IV normal saline -Patient relates poor p.o. intake x1 week  Hyperkalemia -Anticipate improvement with IV fluids -Place on telemetry -A.m. BMP  Tobacco abuse -I have discussed tobacco cessation with the patient.  I have counseled the patient regarding the negative impacts of continued tobacco use including but not limited to lung cancer, COPD, and cardiovascular disease.  I have discussed alternatives to tobacco and modalities that may help facilitate tobacco cessation including but not limited to biofeedback, hypnosis, and medications.  Total time spent with tobacco counseling was 4 minutes.  Personal history of PCN allergy -causes N/V -tolerated ceftriaxone  Code Status:   FULL Family Communication:  No Family at bedside Disposition Plan: Pending clinical improvement Consults called: none DVT Prophylaxis: [place] Lovenox  Estimated body mass index is 33 kg/m as calculated from the following:   Height as of this encounter: 5\' 10"  (1.778 m).   Weight as of this encounter: 104.3 kg.    Subjective: Patient complains of ongoing cough.  And shortness of breath  Objective: Vitals:   04/09/18 1659 04/09/18 1831 04/09/18 2044 04/10/18 0510  BP:  124/88 108/87 122/74  Pulse:  (!) 115 Marland Kitchen)  105 (!) 110  Resp:  (!) 23 19 18   Temp:  99.4 F (37.4 C) 98.8 F (37.1 C) 99.5 F (37.5 C)  TempSrc:  Oral Oral Oral  SpO2: 94% 95% 95% 90%  Weight:      Height:        Intake/Output Summary (Last 24 hours) at 04/10/2018 1127 Last data filed at 04/10/2018 0200 Gross per 24  hour  Intake 614.29 ml  Output -  Net 614.29 ml   Filed Weights   04/09/18 1016  Weight: 104.3 kg    Examination:  General exam: Appears calm and comfortable  Respiratory system: Scattered rhonchi and wheezing to auscultation. Respiratory effort normal. Cardiovascular system: S1 & S2 heard, RRR. No JVD, murmurs, rubs, gallops or clicks. No pedal edema. Gastrointestinal system: Abdomen is nondistended, soft and nontender. No organomegaly or masses felt. Normal bowel sounds heard. Central nervous system: Alert and oriented. No focal neurological deficits. Extremities: Symmetric 5 x 5 power. Skin: No rashes, lesions or ulcers Psychiatry: Judgement and insight appear normal. Mood & affect appropriate.     Data Reviewed: I have personally reviewed following labs and imaging studies  CBC: Recent Labs  Lab 04/09/18 1116 04/10/18 0417  WBC 7.8 7.5  NEUTROABS 5.9 5.4  HGB 17.8* 16.5  HCT 53.0* 51.6  MCV 93.0 96.8  PLT 143* PLATELET CLUMPS NOTED ON SMEAR, UNABLE TO ESTIMATE   Basic Metabolic Panel: Recent Labs  Lab 04/09/18 1210 04/10/18 0417  NA 130*  --   K 5.6*  --   CL 96*  --   CO2 24  --   GLUCOSE 161*  --   BUN 23*  --   CREATININE 1.02  --   CALCIUM 8.5*  --   MG  --  2.1   GFR: Estimated Creatinine Clearance: 95.5 mL/min (by C-G formula based on SCr of 1.02 mg/dL). Liver Function Tests: Recent Labs  Lab 04/09/18 1210  AST 39  ALT 35  ALKPHOS 74  BILITOT 0.7  PROT 6.8  ALBUMIN 3.5   No results for input(s): LIPASE, AMYLASE in the last 168 hours. No results for input(s): AMMONIA in the last 168 hours. Coagulation Profile: No results for input(s): INR, PROTIME in the last 168 hours. Cardiac Enzymes: Recent Labs  Lab 04/10/18 0417  CKTOTAL 335   BNP (last 3 results) No results for input(s): PROBNP in the last 8760 hours. HbA1C: No results for input(s): HGBA1C in the last 72 hours. CBG: No results for input(s): GLUCAP in the last 168 hours.  Lipid Profile: No results for input(s): CHOL, HDL, LDLCALC, TRIG, CHOLHDL, LDLDIRECT in the last 72 hours. Thyroid Function Tests: Recent Labs    04/10/18 0417  TSH 0.939   Anemia Panel: Recent Labs    04/10/18 0417  VITAMINB12 2,016*  FOLATE 27.5   Sepsis Labs: Recent Labs  Lab 04/09/18 1116 04/10/18 0417  PROCALCITON  --  0.90  LATICACIDVEN 1.8  --     Recent Results (from the past 240 hour(s))  Blood culture (routine x 2)     Status: None (Preliminary result)   Collection Time: 04/09/18 11:00 AM  Result Value Ref Range Status   Specimen Description BLOOD LEFT ARM  Final   Special Requests   Final    BOTTLES DRAWN AEROBIC AND ANAEROBIC Blood Culture adequate volume   Culture   Final    NO GROWTH < 24 HOURS Performed at [facility], [address] [place], [place] [address]  Report Status PENDING  Incomplete  Blood culture (routine x 2)     Status: None (Preliminary result)   Collection Time: 04/09/18 11:45 AM  Result Value Ref Range Status   Specimen Description BLOOD LEFT ARM  Final   Special Requests   Final    BOTTLES DRAWN AEROBIC AND ANAEROBIC Blood Culture adequate volume   Culture   Final    NO GROWTH < 24 HOURS Performed at [facility], [address], [place], [place] [address]    Report Status PENDING  Incomplete  Respiratory Panel by PCR     Status: Abnormal   Collection Time: 04/09/18  2:21 PM  Result Value Ref Range Status   Adenovirus NOT DETECTED NOT DETECTED Final   Coronavirus 229E NOT DETECTED NOT DETECTED Final    Comment: (NOTE) The Coronavirus on the Respiratory Panel, DOES NOT test for the novel  Coronavirus (2019 nCoV)    Coronavirus HKU1 NOT DETECTED NOT DETECTED Final   Coronavirus NL63 NOT DETECTED NOT DETECTED Final   Coronavirus OC43 NOT DETECTED NOT DETECTED Final   Metapneumovirus DETECTED (A) NOT DETECTED Final   Rhinovirus / Enterovirus NOT DETECTED NOT DETECTED Final   Influenza A NOT DETECTED NOT DETECTED Final    Influenza B NOT DETECTED NOT DETECTED Final   Parainfluenza Virus 1 NOT DETECTED NOT DETECTED Final   Parainfluenza Virus 2 NOT DETECTED NOT DETECTED Final   Parainfluenza Virus 3 NOT DETECTED NOT DETECTED Final   Parainfluenza Virus 4 NOT DETECTED NOT DETECTED Final   Respiratory Syncytial Virus NOT DETECTED NOT DETECTED Final   Bordetella pertussis NOT DETECTED NOT DETECTED Final   Chlamydophila pneumoniae NOT DETECTED NOT DETECTED Final   Mycoplasma pneumoniae NOT DETECTED NOT DETECTED Final    Comment: Performed at [facility], [place] [address] [place], [place] [address]         Radiology Studies: Dg Chest 2 View  Result Date: 04/09/2018 CLINICAL DATA:  Cough and congestion.  Shortness of breath. EXAM: CHEST - 2 VIEW COMPARISON:  None. FINDINGS: There is interstitial and patchy airspace opacity throughout both perihilar regions with involvement of portions of the right middle lobe as well as anterior segments of each upper lobe and a small portion of the left lower lobe. Heart size and pulmonary vascular normal. No adenopathy. There is degenerative change in the lower thoracic spine as well as in each shoulder. There is mild anterior wedging of several lower thoracic vertebral bodies. IMPRESSION: Interstitial and patchy airspace opacity bilaterally, more on the right than on the left. Suspect multifocal pneumonia. Atypical pneumonia must be a consideration given this appearance. Viral pneumonitis is quite possible in this circumstance. No adenopathy appreciable. Anterior wedging of several lower thoracic vertebral bodies noted. Electronically Signed   By: Lowella Grip III M.D.   On: 04/09/2018 11:04        Scheduled Meds: . albuterol  2 puff Inhalation TID  . enoxaparin (LOVENOX) injection  40 mg Subcutaneous Q24H  . ipratropium  2 puff Inhalation TID  . mouth rinse  15 mL Mouth Rinse BID   Continuous Infusions: . sodium chloride 100 mL/hr at 04/10/18 1118     LOS:  1 day     Georgette Shell, MD [facility] If 7PM-7AM, please contact night-coverage [email or web address] Password [facility] 04/10/2018, 11:27 AM

## 2018-04-10 NOTE — Progress Notes (Signed)
RA sat checked on patient while at rest, patients sats dropped to 79% within 2 minutes of removing O2.  [place]arnet re-applied, sats up to 91% on 3L

## 2018-04-11 LAB — CBC WITH DIFFERENTIAL/PLATELET
Basophils Absolute: 0.1 10*3/uL (ref 0.0–0.1)
Basophils Relative: 1 %
Eosinophils Absolute: 0 10*3/uL (ref 0.0–0.5)
Eosinophils Relative: 0 %
HCT: 51.8 % (ref 39.0–52.0)
Hemoglobin: 16.6 g/dL (ref 13.0–17.0)
Lymphocytes Relative: 21 %
Lymphs Abs: 1.1 10*3/uL (ref 0.7–4.0)
MCH: 30.9 pg (ref 26.0–34.0)
MCHC: 32 g/dL (ref 30.0–36.0)
MCV: 96.3 fL (ref 80.0–100.0)
Monocytes Absolute: 0.4 10*3/uL (ref 0.1–1.0)
Monocytes Relative: 8 %
Neutro Abs: 3.5 10*3/uL (ref 1.7–7.7)
Neutrophils Relative %: 69 %
Platelets: 119 10*3/uL — ABNORMAL LOW (ref 150–400)
RBC: 5.38 MIL/uL (ref 4.22–5.81)
RDW: 12.8 % (ref 11.5–15.5)
WBC Morphology: ABNORMAL
WBC: 5 10*3/uL (ref 4.0–10.5)
nRBC: 0 % (ref 0.0–0.2)

## 2018-04-11 LAB — COMPREHENSIVE METABOLIC PANEL
ALT: 64 U/L — ABNORMAL HIGH (ref 0–44)
AST: 74 U/L — ABNORMAL HIGH (ref 15–41)
Albumin: 2.9 g/dL — ABNORMAL LOW (ref 3.5–5.0)
Alkaline Phosphatase: 88 U/L (ref 38–126)
Anion gap: 10 (ref 5–15)
BILIRUBIN TOTAL: 0.6 mg/dL (ref 0.3–1.2)
BUN: 30 mg/dL — ABNORMAL HIGH (ref 6–20)
CO2: 18 mmol/L — ABNORMAL LOW (ref 22–32)
Calcium: 8.1 mg/dL — ABNORMAL LOW (ref 8.9–10.3)
Chloride: 100 mmol/L (ref 98–111)
Creatinine, Ser: 0.96 mg/dL (ref 0.61–1.24)
GFR calc Af Amer: >60 mL/min (ref >60)
GFR calc non Af Amer: >60 mL/min (ref >60)
Glucose, Bld: 335 mg/dL — ABNORMAL HIGH (ref 70–99)
Potassium: 5.3 mmol/L — ABNORMAL HIGH (ref 3.5–5.1)
Sodium: 128 mmol/L — ABNORMAL LOW (ref 135–145)
TOTAL PROTEIN: 5.9 g/dL — AB (ref 6.5–8.1)

## 2018-04-11 LAB — C-REACTIVE PROTEIN: CRP: 2.7 mg/dL — ABNORMAL HIGH (ref <1.0)

## 2018-04-11 LAB — MAGNESIUM: Magnesium: 2.4 mg/dL (ref 1.7–2.4)

## 2018-04-11 LAB — CK: Total CK: 294 U/L (ref 49–397)

## 2018-04-11 MED ORDER — SODIUM CHLORIDE 0.9 % IV SOLN
INTRAVENOUS | Status: DC
  Administered 2018-04-11 – 2018-04-12 (×2): via INTRAVENOUS

## 2018-04-11 MED ORDER — GUAIFENESIN-DM 100-10 MG/5ML PO SYRP: 5.0000 mL | ORAL_SOLUTION | ORAL | Status: DC | PRN

## 2018-04-11 NOTE — Progress Notes (Signed)
Vitals stable. No complaint of pain. Patient stated he feels much better since Solumedrol administration began. Lung sounds rhonchi/wheezes.

## 2018-04-11 NOTE — Progress Notes (Signed)
PROGRESS NOTE    Fernando Williams  DTO:[id number] DOB: 06-25-1960 DOA: 04/09/2018 PCP: Patient, No Pcp Per   Brief Narrative57 y.o.malewithno documented chronic medical problems except for tobacco use presenting with 2 to 3-week history of coughing and shortness of breath. The patient states that he smokes approximately 40 cigars on a weekly basis for the past 30 years. The patient had some subjective fevers and chills at home. He went to urgent care on 03/24/2018. Per the patient, the patient had an abnormal chest x-ray showing infiltrates. He was started on levofloxacin with which he was compliant. The patient took 10 days worth of therapy. Initially, the patient felt somewhat better, but in the past 3 to 4 days, he has had progressive shortness of breath and coughing with occasional blood-tinged sputum. He denies any nausea, vomiting, diarrhea, headache, sore throat, abdominal pain, chest pain. He recently traveled to [place] within the past month. He has had no other travels. He denies any sick contacts that he was aware of with flulike symptoms. He normally lives by himself. In the emergency department, the patient had a temperature of 100.2 F. He was tachycardic with a heart rate 120-130, but was hemodynamically stable. The patient oxygen saturation 83-84% on room air. He was placed on 3 L nasal cannula with saturation 94-95%. The patient was started on ceftriaxone and azithromycin. WBC was 7.8 with platelets 143,000. BMP showed a sodium 130 with potassium 5.6 and serum creatinine 1.02. AST 39, ALT 35, alkaline phosphatase 74, total bilirubin 0.7.Lactic acid 1.8 Assessment & Plan:   Active Problems:   Acute respiratory failure with hypoxia (HCC)   Interstitial pneumonia (HCC)   Tobacco abuse   Thrombocytopenia (HCC)   Hyperkalemia   Community acquired pneumonia   Acute respiratory failure with hypoxia/interstitial pneumonitis/COPD exacerbation-RSV positive  for metapneumovirus.  Flu a and B-.  COVID-19 pending.  LDH 222 CRP 3.6 procalcitonin 0.90 lactic acid 1.8 -Currently  on 3 L saturation 92 -Wean oxygen for saturation greater than 92% Continue MDI -Add Solu-Medrol  Thrombocytopenia -Likely due to viral pneumonitis -Monitor for signs of bleeding  Hyponatremia -Secondary to volume depletion and poor solute intake, patient received normal saline still with hyponatremia to 128.  Follow-up in the morning.  Normal TSH.  Hyperkalemia -Anticipate improvement with IV fluids -Place on telemetry -A.m. BMP  Tobacco abuse -I have discussed tobacco cessation with the patient. I have counseled the patient regarding the negative impacts of continued tobacco use including but not limited to lung cancer, COPD, and cardiovascular disease. I have discussed alternatives to tobacco and modalities that may help facilitate tobacco cessation including but not limited to biofeedback, hypnosis, and medications. Total time spent with tobacco counseling was 4 minutes.  Personal history of PCN allergy -causes N/V -tolerated ceftriaxone  Code Status:FULL Family Communication: No Family at bedside Disposition Plan: Pending clinical improvement Consults called:none DVT Prophylaxis:  Lovenox   Nutrition Problem: Increased nutrient needs Etiology: acute illness     Signs/Symptoms: estimated needs    Interventions: Ensure Enlive (each supplement provides 350kcal and 20 grams of protein), MVI  Estimated body mass index is 33 kg/m as calculated from the following:   Height as of this encounter: 5\' 10"  (1.778 m).   Weight as of this encounter: 104.3 kg.   Subjective:   Objective: Vitals:   04/10/18 2137 04/11/18 0451 04/11/18 0849 04/11/18 1155  BP: (!) 130/99 (!) 116/91  (!) 134/91  Pulse: (!) 107 93  64  Resp: 20 20  20  Temp: 98 F (36.7 C) 98.2 F (36.8 C)  98.2 F (36.8 C)  TempSrc: Oral Oral  Oral  SpO2: 93% 95% 95%  95%  Weight:      Height:        Intake/Output Summary (Last 24 hours) at 04/11/2018 1207 Last data filed at 04/11/2018 1100 Gross per 24 hour  Intake 2765.15 ml  Output -  Net 2765.15 ml   Filed Weights   04/09/18 1016  Weight: 104.3 kg    Examination:  General exam: Appears calm and comfortable  Respiratory system: Wheezing bilaterally to auscultation. Respiratory effort normal. Cardiovascular system: S1 & S2 heard, RRR. No JVD, murmurs, rubs, gallops or clicks. No pedal edema. Gastrointestinal system: Abdomen is nondistended, soft and nontender. No organomegaly or masses felt. Normal bowel sounds heard. Central nervous system: Alert and oriented. No focal neurological deficits. Extremities: Symmetric 5 x 5 power. Skin: No rashes, lesions or ulcers Psychiatry: Judgement and insight appear normal. Mood & affect appropriate.     Data Reviewed: I have personally reviewed following labs and imaging studies  CBC: Recent Labs  Lab 04/09/18 1116 04/10/18 0417 04/11/18 0346  WBC 7.8 7.5 5.0  NEUTROABS 5.9 5.4 3.5  HGB 17.8* 16.5 16.6  HCT 53.0* 51.6 51.8  MCV 93.0 96.8 96.3  PLT 143* PLATELET CLUMPS NOTED ON SMEAR, UNABLE TO ESTIMATE 115*   Basic Metabolic Panel: Recent Labs  Lab 04/09/18 1210 04/10/18 0417 04/11/18 0346  NA 130*  --  128*  K 5.6*  --  5.3*  CL 96*  --  100  CO2 24  --  18*  GLUCOSE 161*  --  335*  BUN 23*  --  30*  CREATININE 1.02  --  0.96  CALCIUM 8.5*  --  8.1*  MG  --  2.1 2.4   GFR: Estimated Creatinine Clearance: 101.4 mL/min (by C-G formula based on SCr of 0.96 mg/dL). Liver Function Tests: Recent Labs  Lab 04/09/18 1210 04/11/18 0346  AST 39 74*  ALT 35 64*  ALKPHOS 74 88  BILITOT 0.7 0.6  PROT 6.8 5.9*  ALBUMIN 3.5 2.9*   No results for input(s): LIPASE, AMYLASE in the last 168 hours. No results for input(s): AMMONIA in the last 168 hours. Coagulation Profile: No results for input(s): INR, PROTIME in the last 168 hours.  Cardiac Enzymes: Recent Labs  Lab 04/10/18 0417 04/11/18 0346  CKTOTAL 335 294   BNP (last 3 results) No results for input(s): PROBNP in the last 8760 hours. HbA1C: No results for input(s): HGBA1C in the last 72 hours. CBG: No results for input(s): GLUCAP in the last 168 hours. Lipid Profile: No results for input(s): CHOL, HDL, LDLCALC, TRIG, CHOLHDL, LDLDIRECT in the last 72 hours. Thyroid Function Tests: Recent Labs    04/10/18 0417  TSH 0.939   Anemia Panel: Recent Labs    04/10/18 0417  VITAMINB12 2,016*  FOLATE 27.5   Sepsis Labs: Recent Labs  Lab 04/09/18 1116 04/10/18 0417  PROCALCITON  --  0.90  LATICACIDVEN 1.8  --     Recent Results (from the past 240 hour(s))  Blood culture (routine x 2)     Status: None (Preliminary result)   Collection Time: 04/09/18 11:00 AM  Result Value Ref Range Status   Specimen Description BLOOD LEFT ARM  Final   Special Requests   Final    BOTTLES DRAWN AEROBIC AND ANAEROBIC Blood Culture adequate volume   Culture   Final  NO GROWTH 2 DAYS Performed at [facility], [address] [place], [place] [address]    Report Status PENDING  Incomplete  Blood culture (routine x 2)     Status: None (Preliminary result)   Collection Time: 04/09/18 11:45 AM  Result Value Ref Range Status   Specimen Description BLOOD LEFT ARM  Final   Special Requests   Final    BOTTLES DRAWN AEROBIC AND ANAEROBIC Blood Culture adequate volume   Culture   Final    NO GROWTH 2 DAYS Performed at [facility], [address], [place], [place] [address]    Report Status PENDING  Incomplete  Respiratory Panel by PCR     Status: Abnormal   Collection Time: 04/09/18  2:21 PM  Result Value Ref Range Status   Adenovirus NOT DETECTED NOT DETECTED Final   Coronavirus 229E NOT DETECTED NOT DETECTED Final    Comment: (NOTE) The Coronavirus on the Respiratory Panel, DOES NOT test for the novel  Coronavirus (2019 nCoV)    Coronavirus HKU1 NOT DETECTED  NOT DETECTED Final   Coronavirus NL63 NOT DETECTED NOT DETECTED Final   Coronavirus OC43 NOT DETECTED NOT DETECTED Final   Metapneumovirus DETECTED (A) NOT DETECTED Final   Rhinovirus / Enterovirus NOT DETECTED NOT DETECTED Final   Influenza A NOT DETECTED NOT DETECTED Final   Influenza B NOT DETECTED NOT DETECTED Final   Parainfluenza Virus 1 NOT DETECTED NOT DETECTED Final   Parainfluenza Virus 2 NOT DETECTED NOT DETECTED Final   Parainfluenza Virus 3 NOT DETECTED NOT DETECTED Final   Parainfluenza Virus 4 NOT DETECTED NOT DETECTED Final   Respiratory Syncytial Virus NOT DETECTED NOT DETECTED Final   Bordetella pertussis NOT DETECTED NOT DETECTED Final   Chlamydophila pneumoniae NOT DETECTED NOT DETECTED Final   Mycoplasma pneumoniae NOT DETECTED NOT DETECTED Final    Comment: Performed at [facility], [place] [address] [place], [place] [address]         Radiology Studies: No results found.      Scheduled Meds: . albuterol  2 puff Inhalation TID  . enoxaparin (LOVENOX) injection  40 mg Subcutaneous Q24H  . feeding supplement (ENSURE ENLIVE)  237 mL Oral Q24H  . ipratropium  2 puff Inhalation TID  . mouth rinse  15 mL Mouth Rinse BID  . methylPREDNISolone (SOLU-MEDROL) injection  80 mg Intravenous Q12H  . multivitamin with minerals  1 tablet Oral Daily   Continuous Infusions: . sodium chloride 100 mL/hr at 04/11/18 0726     LOS: 2 days     Georgette Shell, MD [facility]  If 7PM-7AM, please contact night-coverage [email or web address] Password [facility] 04/11/2018, 12:07 PM

## 2018-04-12 LAB — CBC WITH DIFFERENTIAL/PLATELET
ABS IMMATURE GRANULOCYTES: 0.04 10*3/uL (ref 0.00–0.07)
Basophils Absolute: 0.1 10*3/uL (ref 0.0–0.1)
Basophils Relative: 1 %
Eosinophils Absolute: 0 10*3/uL (ref 0.0–0.5)
Eosinophils Relative: 0 %
HCT: 50.2 % (ref 39.0–52.0)
Hemoglobin: 16.2 g/dL (ref 13.0–17.0)
Immature Granulocytes: 0 %
Lymphocytes Relative: 12 %
Lymphs Abs: 1.3 10*3/uL (ref 0.7–4.0)
MCH: 30.6 pg (ref 26.0–34.0)
MCHC: 32.3 g/dL (ref 30.0–36.0)
MCV: 94.9 fL (ref 80.0–100.0)
MONOS PCT: 3 %
Monocytes Absolute: 0.3 10*3/uL (ref 0.1–1.0)
NEUTROS ABS: 8.8 10*3/uL — AB (ref 1.7–7.7)
Neutrophils Relative %: 84 %
Platelets: 138 10*3/uL — ABNORMAL LOW (ref 150–400)
RBC: 5.29 MIL/uL (ref 4.22–5.81)
RDW: 13 % (ref 11.5–15.5)
WBC: 10.4 10*3/uL (ref 4.0–10.5)
nRBC: 0 % (ref 0.0–0.2)

## 2018-04-12 LAB — COMPREHENSIVE METABOLIC PANEL
ALT: 65 U/L — ABNORMAL HIGH (ref 0–44)
AST: 55 U/L — ABNORMAL HIGH (ref 15–41)
Albumin: 2.8 g/dL — ABNORMAL LOW (ref 3.5–5.0)
Alkaline Phosphatase: 88 U/L (ref 38–126)
Anion gap: 7 (ref 5–15)
BUN: 28 mg/dL — ABNORMAL HIGH (ref 6–20)
CO2: 24 mmol/L (ref 22–32)
Calcium: 8.4 mg/dL — ABNORMAL LOW (ref 8.9–10.3)
Chloride: 99 mmol/L (ref 98–111)
Creatinine, Ser: 0.88 mg/dL (ref 0.61–1.24)
GFR calc non Af Amer: >60 mL/min (ref >60)
Glucose, Bld: 369 mg/dL — ABNORMAL HIGH (ref 70–99)
Potassium: 5.1 mmol/L (ref 3.5–5.1)
SODIUM: 130 mmol/L — AB (ref 135–145)
Total Bilirubin: 0.6 mg/dL (ref 0.3–1.2)
Total Protein: 5.6 g/dL — ABNORMAL LOW (ref 6.5–8.1)

## 2018-04-12 LAB — CK: Total CK: 161 U/L (ref 49–397)

## 2018-04-12 LAB — C-REACTIVE PROTEIN: CRP: 1.2 mg/dL — ABNORMAL HIGH (ref <1.0)

## 2018-04-12 LAB — MAGNESIUM: MAGNESIUM: 2.3 mg/dL (ref 1.7–2.4)

## 2018-04-12 MED ORDER — ADULT MULTIVITAMIN W/MINERALS CH: 1.0000 | ORAL_TABLET | Freq: Every day | ORAL | Status: DC

## 2018-04-12 MED ORDER — PREDNISONE 10 MG PO TABS: ORAL_TABLET | ORAL | 0 refills | Status: DC

## 2018-04-12 MED ORDER — ALBUTEROL SULFATE HFA 108 (90 BASE) MCG/ACT IN AERS: 2.0000 | INHALATION_SPRAY | Freq: Three times a day (TID) | RESPIRATORY_TRACT | 1 refills | Status: DC

## 2018-04-12 NOTE — Plan of Care (Signed)
  Problem: Activity: Goal: Risk for activity intolerance will decrease Outcome: Progressing   Problem: Nutrition: Goal: Adequate nutrition will be maintained Outcome: Progressing   Problem: Pain Managment: Goal: General experience of comfort will improve Outcome: Progressing   Problem: Safety: Goal: Ability to remain free from injury will improve Outcome: Progressing   

## 2018-04-12 NOTE — Progress Notes (Signed)
DC instructions given and explained to patient in detail. Patient also provided Covid-19 instructions and understands need to self quarantine once home until contacted by Health Department. Pt verbalizes understanding

## 2018-04-12 NOTE — TOC Initial Note (Signed)
Transition of Care [facility]) - Initial/Assessment Note    Patient Details  Name: Fernando Williams MRN: [id number] Date of Birth: [birth year]  [facility] CM/SW Contact:    Erenest Rasher, RN Phone Number: 04/12/2018, 2:01 PM  Clinical Narrative:                 Spoke to pt and made him aware [facility] will deliver a portable to his room and a oxygen concentrator to his home.   Expected Discharge Plan: Home/Self Care Barriers to Discharge: No Barriers Identified   Patient Goals and CMS Choice        Expected Discharge Plan and Services Expected Discharge Plan: Home/Self Care In-house Referral: NA Discharge Planning Services: CM Consult Post Acute Care Choice: Durable Medical Equipment Living arrangements for the past 2 months: Single Family Home Expected Discharge Date: 04/12/18               DME Arranged: Oxygen DME Agency: [employer] HH Arranged: NA [place] Agency: NA  Prior Living Arrangements/Services Living arrangements for the past 2 months: Single Family Home Lives with:: Self Patient language and need for interpreter reviewed:: Yes Do you feel safe going back to the place where you live?: Yes      Need for Family Participation in Patient Care: No (Comment) Care giver support system in place?: No (comment) Current home services: DME Criminal Activity/Legal Involvement Pertinent to Current Situation/Hospitalization: No - Comment as needed  Activities of Daily Living Home Assistive Devices/Equipment: None ADL Screening (condition at time of admission) Patient's cognitive ability adequate to safely complete daily activities?: Yes Is the patient deaf or have difficulty hearing?: No Does the patient have difficulty seeing, even when wearing glasses/contacts?: No Does the patient have difficulty concentrating, remembering, or making decisions?: No Patient able to express need for assistance with ADLs?: Yes Does the patient have difficulty dressing or  bathing?: No Independently performs ADLs?: Yes (appropriate for developmental age) Does the patient have difficulty walking or climbing stairs?: No Weakness of Legs: None Weakness of Arms/Hands: None  Permission Sought/Granted Permission sought to share information with : Case Manager, PCP, Family Supports Permission granted to share information with : Yes, Verbal Permission Granted  Share Information with NAME: Kaito Schulenburg  Permission granted to share info w AGENCY: [place] granted to share info w Relationship: son     Emotional Assessment Appearance:: Appears stated age Attitude/Demeanor/Rapport: Engaged Affect (typically observed): Accepting Orientation: : Oriented to Self, Oriented to Place, Oriented to  Time, Oriented to Situation   Psych Involvement: No (comment)  Admission diagnosis:  Community acquired pneumonia, unspecified laterality [J18.9] Patient Active Problem List   Diagnosis Date Noted  . Community acquired pneumonia   . Acute respiratory failure with hypoxia ([place]) 04/09/2018  . Interstitial pneumonia ([place]) 04/09/2018  . Tobacco abuse 04/09/2018  . Thrombocytopenia ([place]) 04/09/2018  . Hyperkalemia 04/09/2018  . SPRAIN&STRAIN OTH SPEC SITES SHOULDER&UPPER ARM 07/12/2008   PCP:  Patient, No Pcp Per Pharmacy:   [facility] [place] [facility] [address] [place] [address] Phone: [phone] Fax: [phone]     Social Determinants of Health (SDOH) Interventions    Readmission Risk Interventions No flowsheet data found.

## 2018-04-12 NOTE — Discharge Instructions (Addendum)
Person Under Monitoring Name: Fernando Williams  Location: [place] [address]   Infection Prevention Recommendations for Individuals Confirmed to have, or Being Evaluated for, 2019 Novel Coronavirus (COVID-19) Infection Who Receive Care at Home  Individuals who are confirmed to have, or are being evaluated for, COVID-19 should follow the prevention steps below until a healthcare provider or local or state health department says they can return to normal activities.  Stay home except to get medical care You should restrict activities outside your home, except for getting medical care. Do not go to work, school, or public areas, and do not use public transportation or taxis.  Call ahead before visiting your doctor Before your medical appointment, call the healthcare provider and tell them that you have, or are being evaluated for, COVID-19 infection. This will help the healthcare providers office take steps to keep other people from getting infected. Ask your healthcare provider to call the local or state health department.  Monitor your symptoms Seek prompt medical attention if your illness is worsening (e.g., difficulty breathing). Before going to your medical appointment, call the healthcare provider and tell them that you have, or are being evaluated for, COVID-19 infection. Ask your healthcare provider to call the local or state health department.  Wear a facemask You should wear a facemask that covers your nose and mouth when you are in the same room with other people and when you visit a healthcare provider. People who live with or visit you should also wear a facemask while they are in the same room with you.  Separate yourself from other people in your home As much as possible, you should stay in a different room from other people in your home. Also, you should use a separate bathroom, if available.  Avoid sharing household items You should not share  dishes, drinking glasses, cups, eating utensils, towels, bedding, or other items with other people in your home. After using these items, you should wash them thoroughly with soap and water.  Cover your coughs and sneezes Cover your mouth and nose with a tissue when you cough or sneeze, or you can cough or sneeze into your sleeve. Throw used tissues in a lined trash can, and immediately wash your hands with soap and water for at least 20 seconds or use an alcohol-based hand rub.  Wash your [facility] [employer] your hands often and thoroughly with soap and water for at least 20 seconds. You can use an alcohol-based hand sanitizer if soap and water are not available and if your hands are not visibly dirty. Avoid touching your eyes, nose, and mouth with unwashed hands.   Prevention Steps for Caregivers and Household Members of Individuals Confirmed to have, or Being Evaluated for, COVID-19 Infection Being Cared for in the Home  If you live with, or provide care at home for, a person confirmed to have, or being evaluated for, COVID-19 infection please follow these guidelines to prevent infection:  Follow healthcare providers instructions Make sure that you understand and can help the patient follow any healthcare provider instructions for all care.  Provide for the patients basic needs You should help the patient with basic needs in the home and provide support for getting groceries, prescriptions, and other personal needs.  Monitor the patients symptoms If they are getting sicker, call his or her medical provider and tell them that the patient has, or is being evaluated for, COVID-19 infection. This will help the healthcare providers office  take steps to keep other people from getting infected. Ask the healthcare provider to call the local or state health department.  Limit the number of people who have contact with the patient  If possible, have only one caregiver for the patient.  Other  household members should stay in another home or place of residence. If this is not possible, they should stay  in another room, or be separated from the patient as much as possible. Use a separate bathroom, if available.  Restrict visitors who do not have an essential need to be in the home.  Keep older adults, very young children, and other sick people away from the patient Keep older adults, very young children, and those who have compromised immune systems or chronic health conditions away from the patient. This includes people with chronic heart, lung, or kidney conditions, diabetes, and cancer.  Ensure good ventilation Make sure that shared spaces in the home have good air flow, such as from an air conditioner or an opened window, weather permitting.  Wash your hands often  Wash your hands often and thoroughly with soap and water for at least 20 seconds. You can use an alcohol based hand sanitizer if soap and water are not available and if your hands are not visibly dirty.  Avoid touching your eyes, nose, and mouth with unwashed hands.  Use disposable paper towels to dry your hands. If not available, use dedicated cloth towels and replace them when they become wet.  Wear a facemask and gloves  Wear a disposable facemask at all times in the room and gloves when you touch or have contact with the patients blood, body fluids, and/or secretions or excretions, such as sweat, saliva, sputum, nasal mucus, vomit, urine, or feces.  Ensure the mask fits over your nose and mouth tightly, and do not touch it during use.  Throw out disposable facemasks and gloves after using them. Do not reuse.  Wash your hands immediately after removing your facemask and gloves.  If your personal clothing becomes contaminated, carefully remove clothing and launder. Wash your hands after handling contaminated clothing.  Place all used disposable facemasks, gloves, and other waste in a lined container before  disposing them with other household waste.  Remove gloves and wash your hands immediately after handling these items.  Do not share dishes, glasses, or other household items with the patient  Avoid sharing household items. You should not share dishes, drinking glasses, cups, eating utensils, towels, bedding, or other items with a patient who is confirmed to have, or being evaluated for, COVID-19 infection.  After the person uses these items, you should wash them thoroughly with soap and water.  Wash laundry thoroughly  Immediately remove and wash clothes or bedding that have blood, body fluids, and/or secretions or excretions, such as sweat, saliva, sputum, nasal mucus, vomit, urine, or feces, on them.  Wear gloves when handling laundry from the patient.  Read and follow directions on labels of laundry or clothing items and detergent. In general, wash and dry with the warmest temperatures recommended on the label.  Clean all areas the individual has used often  Clean all touchable surfaces, such as counters, tabletops, doorknobs, bathroom fixtures, toilets, phones, keyboards, tablets, and bedside tables, every day. Also, clean any surfaces that may have blood, body fluids, and/or secretions or excretions on them.  Wear gloves when cleaning surfaces the patient has come in contact with.  Use a diluted bleach solution (e.g., dilute bleach with 1 part  bleach and 10 parts water) or a household disinfectant with a label that says EPA-registered for coronaviruses. To make a bleach solution at home, add 1 tablespoon of bleach to 1 quart (4 cups) of water. For a larger supply, add  cup of bleach to 1 gallon (16 cups) of water.  Read labels of cleaning products and follow recommendations provided on product labels. Labels contain instructions for safe and effective use of the cleaning product including precautions you should take when applying the product, such as wearing gloves or eye protection  and making sure you have good ventilation during use of the product.  Remove gloves and wash hands immediately after cleaning.  Monitor yourself for signs and symptoms of illness Caregivers and household members are considered close contacts, should monitor their health, and will be asked to limit movement outside of the home to the extent possible. Follow the monitoring steps for close contacts listed on the symptom monitoring form.   ? If you have additional questions, contact your local health department or call the epidemiologist on call at [phone] (available 24/7). ? This guidance is subject to change. For the most up-to-date guidance from [facility], please refer to their website: [email or web address].pl     Person Under Monitoring Name: Fernando Williams  Location: [place]. [place] [address]   CORONAVIRUS DISEASE 2019 (COVID-19) Guidance for Persons Under Investigation You are being tested for the virus that causes coronavirus disease 2019 (COVID-19). Public health actions are necessary to ensure protection of your health and the health of others, and to prevent further spread of infection. COVID-19 is caused by a virus that can cause symptoms, such as fever, cough, and shortness of breath. The primary transmission from person to person is by coughing or sneezing. On February 13, 2018, the [place] announced a [employer] Emergency of International Concern and on February 14, 2018 the [place]. Department of Health and Human Services declared a public health emergency. If the virus that causesCOVID-19 spreads in the community, it could have severe public health consequences.  As a person under investigation for COVID-19, the [place] advises you to adhere to the following guidance until your test results are reported to you. If your test  result is positive, you will receive additional information from your provider and your local health department at that time.   Remain at home until you are cleared by your health provider or public health authorities.   Keep a log of visitors to your home using the form provided. Any visitors to your home must be aware of your isolation status.  If you plan to move to a new address or leave the county, notify the local health department in your county.  Call a doctor or seek care if you have an urgent medical need. Before seeking medical care, call ahead and get instructions from the provider before arriving at the medical office, clinic or hospital. Notify them that you are being tested for the virus that causes COVID-19 so arrangements can be made, as necessary, to prevent transmission to others in the healthcare setting. Next, notify the local health department in your county.  If a medical emergency arises and you need to call 911, inform the first responders that you are being tested for the virus that causes COVID-19. Next, notify the local health department in your county.  Adhere to all guidance set forth by the [employer] of  Public Health for Home Care of patients that is based on guidance from the Center for Disease Control and Prevention with suspected or confirmed COVID-19. It is provided with this guidance for Persons Under Investigation.  Your health and the health of our community are our top priorities. Public Health officials remain available to provide assistance and counseling to you about COVID-19 and compliance with this guidance.  Provider: ____________________________________________________________ Date: ______/_____/_________  By signing below, you acknowledge that you have read and agree to comply with this Guidance for Persons Under Investigation. ______________________________________________________________ Date: ______/_____/_________  WHO DO I  CALL? You can find a list of local health departments here: [email or web address] Health Department: ____________________________________________________________________ Contact Name: ________________________________________________________________________ Telephone: ___________________________________________________________________________  Marice Potter, [place], Communicable Disease Branch COVID-19 Guidance for Persons Under Investigation March 22, 2018

## 2018-04-12 NOTE — Care Management Important Message (Addendum)
Important Message  Patient Details  Name: Fernando Williams MRN: [id number] Date of Birth: Jan 13, 1961   Medicare Important Message Given:  Stefanie Libel RN, reviewed Medicare IM with pt and verbalized understanding. Unit RN delivered form to patient's room. )    Erenest Rasher, RN 04/12/2018, 2:14 PM

## 2018-04-12 NOTE — Discharge Summary (Signed)
Physician Discharge Summary  Fernando Williams XYV:[id number] DOB: 1960/01/31 DOA: 04/09/2018  PCP: Patient, No Pcp Per  Admit date: 04/09/2018 Discharge date: 04/12/2018  Admitted From: Home Disposition: Home Recommendations for Outpatient Follow-up:  1. Follow up with PCP in 1-2 weeks 2. Please obtain BMP/CBC in one week 3. Please follow up on the following pending results:COVID [place] none Equipment/Devices oxygen at 2 L  Discharge Condition: Stable and improved CODE STATUS: Full code Diet recommendation: Cardiac  Brief/Interim Summary:57 y.o.malewithno documented chronic medical problems except for tobacco use presenting with 2 to 3-week history of coughing and shortness of breath. The patient states that he smokes approximately 40 cigars on a weekly basis for the past 30 years. The patient had some subjective fevers and chills at home. He went to urgent care on 03/24/2018. Per the patient, the patient had an abnormal chest x-ray showing infiltrates. He was started on levofloxacin with which he was compliant. The patient took 10 days worth of therapy. Initially, the patient felt somewhat better, but in the past 3 to 4 days, he has had progressive shortness of breath and coughing with occasional blood-tinged sputum. He denies any nausea, vomiting, diarrhea, headache, sore throat, abdominal pain, chest pain. He recently traveled to [place] within the past month. He has had no other travels. He denies any sick contacts that he was aware of with flulike symptoms. He normally lives by himself. In the emergency department, the patient had a temperature of 100.2 F. He was tachycardic with a heart rate 120-130, but was hemodynamically stable. The patient oxygen saturation 83-84% on room air. He was placed on 3 L nasal cannula with saturation 94-95%. The patient was started on ceftriaxone and azithromycin. WBC was 7.8 with platelets 143,000. BMP showed a sodium 130  with potassium 5.6 and serum creatinine 1.02. AST 39, ALT 35, alkaline phosphatase 74, total bilirubin 0.7.Lactic acid 1.8  Discharge Diagnoses:  Active Problems:   Acute respiratory failure with hypoxia (HCC)   Interstitial pneumonia (HCC)   Tobacco abuse   Thrombocytopenia (HCC)   Hyperkalemia   Community acquired pneumonia     Acute respiratory failure with hypoxia/interstitial pneumonitis/COPD exacerbation-RSV positive for metapneumovirus. Flu a and B-. COVID-19 pending. LDH 222 CRP 3.6 procalcitonin 0.90 lactic acid 1.8 -Currently on 2 Lsaturation 95 Patient will be discharged home on 2 L of oxygen temporarily.  He will follow-up with his PCP.  Health department will call him if his covered comes back positive.  Thrombocytopenia stable and improved  Hyponatremia stable and improved with hydration  Hyperkalemia resolved with IV fluids   Tobacco abuse -I have discussed tobacco cessation with the patient. I have counseled the patient regarding the negative impacts of continued tobacco use including but not limited to lung cancer, COPD, and cardiovascular disease. I have discussed alternatives to tobacco and modalities that may help facilitate tobacco cessation including but not limited to biofeedback, hypnosis, and medications. Total time spent with tobacco counseling was 4 minutes.  I stressed the importance of not smoking especially having oxygen on board.  He  verbalized understanding and he said he is not going to smoke.    Nutrition Problem: Increased nutrient needs Etiology: acute illness    Signs/Symptoms: estimated needs     Interventions: Ensure Enlive (each supplement provides 350kcal and 20 grams of protein), MVI  Estimated body mass index is 33 kg/m as calculated from the following:   Height as of this encounter: 5\' 10"  (1.778 m).  Weight as of this encounter: 104.3 kg.  Discharge Instructions  Discharge Instructions    Call MD for:   difficulty breathing, headache or visual disturbances   Complete by:  As directed    Call MD for:  persistant dizziness or light-headedness   Complete by:  As directed    Call MD for:  persistant nausea and vomiting   Complete by:  As directed    Call MD for:  severe uncontrolled pain   Complete by:  As directed    Diet - low sodium heart healthy   Complete by:  As directed    Increase activity slowly   Complete by:  As directed      Allergies as of 04/12/2018      Reactions   Erythromycin Nausea And Vomiting   Penicillins Other (See Comments)   Did it involve swelling of the face/tongue/throat, SOB, or low BP? No Did it involve sudden or severe rash/hives, skin peeling, or any reaction on the inside of your mouth or nose? Unknown Did you need to seek medical attention at a hospital or doctor's office? Unknown When did it last happen?childhood If all above answers are "NO", may proceed with cephalosporin use. "[name]" - STATES THIS OCCURS WITH ALL  "[name]"      Medication List    STOP taking these medications   BC HEADACHE POWDER PO   Potassium 99 MG Tabs     TAKE these medications   albuterol 108 (90 Base) MCG/ACT inhaler Commonly known as:  PROVENTIL HFA;VENTOLIN HFA Inhale 2 puffs into the lungs 3 (three) times daily.   glucosamine-chondroitin 500-400 MG tablet Take 1 tablet by mouth daily.   MAGNESIUM PO Take 1 tablet by mouth daily.   multivitamin with minerals Tabs tablet Take 1 tablet by mouth daily. Start taking on:  April 13, 2018   predniSONE 10 MG tablet Commonly known as:  DELTASONE Take 4 tablets daily for the first 4 days then 3 tablets daily for the next 4 days then 2 tablets daily for the following 4 days and then 1 tablet daily till done      [facility] Follow up.   Contact information: [place] [id number] [phone]          Allergies  Allergen Reactions  . Erythromycin Nausea And Vomiting  . Penicillins Other (See Comments)    Did it involve swelling of the face/tongue/throat, SOB, or low BP? No Did it involve sudden or severe rash/hives, skin peeling, or any reaction on the inside of your mouth or nose? Unknown Did you need to seek medical attention at a hospital or doctor's office? Unknown When did it last happen?childhood If all above answers are "NO", may proceed with cephalosporin use.      "[name]" - STATES THIS OCCURS WITH ALL  "[name]"    Consultations:  None   Procedures/Studies: Dg Chest 2 View  Result Date: 04/09/2018 CLINICAL DATA:  Cough and congestion.  Shortness of breath. EXAM: CHEST - 2 VIEW COMPARISON:  None. FINDINGS: There is interstitial and patchy airspace opacity throughout both perihilar regions with involvement of portions of the right middle lobe as well as anterior segments of each upper lobe and a small portion of the left lower lobe. Heart size and pulmonary vascular normal. No adenopathy. There is degenerative change in the lower thoracic spine as well as in each  shoulder. There is mild anterior wedging of several lower thoracic vertebral bodies. IMPRESSION: Interstitial and patchy airspace opacity bilaterally, more on the right than on the left. Suspect multifocal pneumonia. Atypical pneumonia must be a consideration given this appearance. Viral pneumonitis is quite possible in this circumstance. No adenopathy appreciable. Anterior wedging of several lower thoracic vertebral bodies noted. Electronically Signed   By: Lowella Grip III M.D.   On: 04/09/2018 11:04    (Echo, Carotid, EGD, Colonoscopy, ERCP)    Subjective: Patient awake alert anxious to go home feels breathing way better than when he first came in COVID test is still pending   Discharge Exam: Vitals:   04/12/18 1112 04/12/18 1113  BP:    Pulse:    Resp:    Temp:    SpO2:  94% 94%   Vitals:   04/12/18 0857 04/12/18 1054 04/12/18 1112 04/12/18 1113  BP: (!) 114/91     Pulse: 92     Resp: 20     Temp: 97.9 F (36.6 C)     TempSrc: Oral     SpO2: 97% 97% 94% 94%  Weight:      Height:        General: Pt is alert, awake, not in acute distress Cardiovascular: RRR, S1/S2 +, no rubs, no gallops Respiratory: CTA bilaterally, no wheezing, no rhonchi Abdominal: Soft, NT, ND, bowel sounds + Extremities: no edema, no cyanosis    The results of significant diagnostics from this hospitalization (including imaging, microbiology, ancillary and laboratory) are listed below for reference.     Microbiology: Recent Results (from the past 240 hour(s))  Blood culture (routine x 2)     Status: None (Preliminary result)   Collection Time: 04/09/18 11:00 AM  Result Value Ref Range Status   Specimen Description BLOOD LEFT ARM  Final   Special Requests   Final    BOTTLES DRAWN AEROBIC AND ANAEROBIC Blood Culture adequate volume   Culture   Final    NO GROWTH 3 DAYS Performed at [facility], [address], [place], [place] [address]    Report Status PENDING  Incomplete  Blood culture (routine x 2)     Status: None (Preliminary result)   Collection Time: 04/09/18 11:45 AM  Result Value Ref Range Status   Specimen Description BLOOD LEFT ARM  Final   Special Requests   Final    BOTTLES DRAWN AEROBIC AND ANAEROBIC Blood Culture adequate volume   Culture   Final    NO GROWTH 3 DAYS Performed at [facility], [address] [place], [place] [address]    Report Status PENDING  Incomplete  Respiratory Panel by PCR     Status: Abnormal   Collection Time: 04/09/18  2:21 PM  Result Value Ref Range Status   Adenovirus NOT DETECTED NOT DETECTED Final   Coronavirus 229E NOT DETECTED NOT DETECTED Final    Comment: (NOTE) The Coronavirus on the Respiratory Panel, DOES NOT test for the novel  Coronavirus (2019 nCoV)    Coronavirus HKU1 NOT DETECTED NOT DETECTED Final    Coronavirus NL63 NOT DETECTED NOT DETECTED Final   Coronavirus OC43 NOT DETECTED NOT DETECTED Final   Metapneumovirus DETECTED (A) NOT DETECTED Final   Rhinovirus / Enterovirus NOT DETECTED NOT DETECTED Final   Influenza A NOT DETECTED NOT DETECTED Final   Influenza B NOT DETECTED NOT DETECTED Final   Parainfluenza Virus 1 NOT DETECTED NOT DETECTED Final   Parainfluenza Virus 2 NOT DETECTED NOT DETECTED Final  Parainfluenza Virus 3 NOT DETECTED NOT DETECTED Final   Parainfluenza Virus 4 NOT DETECTED NOT DETECTED Final   Respiratory Syncytial Virus NOT DETECTED NOT DETECTED Final   Bordetella pertussis NOT DETECTED NOT DETECTED Final   Chlamydophila pneumoniae NOT DETECTED NOT DETECTED Final   Mycoplasma pneumoniae NOT DETECTED NOT DETECTED Final    Comment: Performed at [facility], [place] [address] [place], [place] [address]     Labs: BNP (last 3 results) No results for input(s): BNP in the last 8760 hours. Basic Metabolic Panel: Recent Labs  Lab 04/09/18 1210 04/10/18 0417 04/11/18 0346 04/12/18 0519  NA 130*  --  128* 130*  K 5.6*  --  5.3* 5.1  CL 96*  --  100 99  CO2 24  --  18* 24  GLUCOSE 161*  --  335* 369*  BUN 23*  --  30* 28*  CREATININE 1.02  --  0.96 0.88  CALCIUM 8.5*  --  8.1* 8.4*  MG  --  2.1 2.4 2.3   Liver Function Tests: Recent Labs  Lab 04/09/18 1210 04/11/18 0346 04/12/18 0519  AST 39 74* 55*  ALT 35 64* 65*  ALKPHOS 74 88 88  BILITOT 0.7 0.6 0.6  PROT 6.8 5.9* 5.6*  ALBUMIN 3.5 2.9* 2.8*   No results for input(s): LIPASE, AMYLASE in the last 168 hours. No results for input(s): AMMONIA in the last 168 hours. CBC: Recent Labs  Lab 04/09/18 1116 04/10/18 0417 04/11/18 0346 04/12/18 0519  WBC 7.8 7.5 5.0 10.4  NEUTROABS 5.9 5.4 3.5 8.8*  HGB 17.8* 16.5 16.6 16.2  HCT 53.0* 51.6 51.8 50.2  MCV 93.0 96.8 96.3 94.9  PLT 143* PLATELET CLUMPS NOTED ON SMEAR, UNABLE TO ESTIMATE 119* 138*   Cardiac Enzymes: Recent Labs  Lab  04/10/18 0417 04/11/18 0346 04/12/18 0519  CKTOTAL 335 294 161   BNP: Invalid input(s): POCBNP CBG: No results for input(s): GLUCAP in the last 168 hours. D-Dimer No results for input(s): DDIMER in the last 72 hours. Hgb A1c No results for input(s): HGBA1C in the last 72 hours. Lipid Profile No results for input(s): CHOL, HDL, LDLCALC, TRIG, CHOLHDL, LDLDIRECT in the last 72 hours. Thyroid function studies Recent Labs    04/10/18 0417  TSH 0.939   Anemia work up Recent Labs    04/10/18 [place] 2,016*  FOLATE 27.5   Urinalysis No results found for: COLORURINE, APPEARANCEUR, LABSPEC, [place], GLUCOSEU, [place], [place], [place], PROTEINUR, UROBILINOGEN, NITRITE, LEUKOCYTESUR Sepsis Labs Invalid input(s): PROCALCITONIN,  WBC,  LACTICIDVEN Microbiology Recent Results (from the past 240 hour(s))  Blood culture (routine x 2)     Status: None (Preliminary result)   Collection Time: 04/09/18 11:00 AM  Result Value Ref Range Status   Specimen Description BLOOD LEFT ARM  Final   Special Requests   Final    BOTTLES DRAWN AEROBIC AND ANAEROBIC Blood Culture adequate volume   Culture   Final    NO GROWTH 3 DAYS Performed at [facility], [address] [place], [place] [address]    Report Status PENDING  Incomplete  Blood culture (routine x 2)     Status: None (Preliminary result)   Collection Time: 04/09/18 11:45 AM  Result Value Ref Range Status   Specimen Description BLOOD LEFT ARM  Final   Special Requests   Final    BOTTLES DRAWN AEROBIC AND ANAEROBIC Blood Culture adequate volume   Culture   Final    NO GROWTH 3  DAYS Performed at [facility], [address], [place], [place] [address]    Report Status PENDING  Incomplete  Respiratory Panel by PCR     Status: Abnormal   Collection Time: 04/09/18  2:21 PM  Result Value Ref Range Status   Adenovirus NOT DETECTED NOT DETECTED Final   Coronavirus 229E NOT DETECTED NOT DETECTED Final    Comment:  (NOTE) The Coronavirus on the Respiratory Panel, DOES NOT test for the novel  Coronavirus (2019 nCoV)    Coronavirus HKU1 NOT DETECTED NOT DETECTED Final   Coronavirus NL63 NOT DETECTED NOT DETECTED Final   Coronavirus OC43 NOT DETECTED NOT DETECTED Final   Metapneumovirus DETECTED (A) NOT DETECTED Final   Rhinovirus / Enterovirus NOT DETECTED NOT DETECTED Final   Influenza A NOT DETECTED NOT DETECTED Final   Influenza B NOT DETECTED NOT DETECTED Final   Parainfluenza Virus 1 NOT DETECTED NOT DETECTED Final   Parainfluenza Virus 2 NOT DETECTED NOT DETECTED Final   Parainfluenza Virus 3 NOT DETECTED NOT DETECTED Final   Parainfluenza Virus 4 NOT DETECTED NOT DETECTED Final   Respiratory Syncytial Virus NOT DETECTED NOT DETECTED Final   Bordetella pertussis NOT DETECTED NOT DETECTED Final   Chlamydophila pneumoniae NOT DETECTED NOT DETECTED Final   Mycoplasma pneumoniae NOT DETECTED NOT DETECTED Final    Comment: Performed at [facility], [address] [place], [place] [address]     Time coordinating discharge: 35 minutes  SIGNED:   Georgette Shell, MD  [facility] 04/12/2018, 12:45 PM Pager   If 7PM-7AM, please contact night-coverage [email or web address] Password [email or web address]

## 2018-04-12 NOTE — Progress Notes (Signed)
SATURATION QUALIFICATIONS: (This note is used to comply with regulatory documentation for home oxygen)  Patient Saturations on Room Air at Rest = 89%  Patient Saturations on Room Air while Ambulating = 87%  Patient Saturations on 2 Liters of oxygen while Ambulating = 97%  Please briefly explain why patient needs home oxygen: Patient needs O2 d/t saturations below 90 on RA at rest and ambulating

## 2018-04-14 ENCOUNTER — Telehealth: Payer: Self-pay | Admitting: *Deleted

## 2018-04-14 LAB — CULTURE, BLOOD (ROUTINE X 2)
CULTURE: NO GROWTH
CULTURE: NO GROWTH
Special Requests: ADEQUATE
Special Requests: ADEQUATE

## 2018-04-14 NOTE — Telephone Encounter (Signed)
Contacted pt via phone to assist getting a PCP. Went to [facility] website and Dr Wende Neighbors accepts his insurance. Contacted office and they are accepting new pts. Pt/son would need to pick up a screening form and complete. Once the physician reviews, the office will arrange appt. Provided pt with Dr Juel Burrow contact number. Jonnie Finner RN CCM Case Mgmt phone [phone]

## 2018-04-19 LAB — NOVEL CORONAVIRUS, NAA (HOSPITAL ORDER, SEND-OUT TO REF LAB): SARS-COV-2, NAA: NOT DETECTED

## 2018-04-21 ENCOUNTER — Telehealth: Payer: Self-pay | Admitting: *Deleted

## 2018-04-21 NOTE — Telephone Encounter (Signed)
Received a call from pt states Dr [place] Crane office closed due to family emergency. His son was not able to pick up paperwork. States he did go to [facility] prior to hospital stay. Pt placed on hold. [facility] # [phone], they cannot accept as a PCP but can do a hospital follow up. appt scheduled for 04/23/2018 at 9 am. Faxed dc summary to [phone]. Provided pt with information. Explained he will need to take his FMLA paperwork with him to his appt. Pt states Dr [place] Crane office is in the same building and he will go by and complete screening. Pt states he was negative COVID-19. Jonnie Finner RN CCM Case Mgmt phone [phone]

## 2018-04-23 DIAGNOSIS — Z9189 Other specified personal risk factors, not elsewhere classified: Secondary | ICD-10-CM | POA: Diagnosis not present

## 2018-04-23 DIAGNOSIS — J181 Lobar pneumonia, unspecified organism: Secondary | ICD-10-CM | POA: Diagnosis not present

## 2018-04-23 DIAGNOSIS — R5383 Other fatigue: Secondary | ICD-10-CM | POA: Diagnosis not present

## 2018-04-28 DIAGNOSIS — J189 Pneumonia, unspecified organism: Secondary | ICD-10-CM | POA: Diagnosis not present

## 2018-04-28 DIAGNOSIS — J449 Chronic obstructive pulmonary disease, unspecified: Secondary | ICD-10-CM | POA: Diagnosis not present

## 2018-04-28 DIAGNOSIS — R739 Hyperglycemia, unspecified: Secondary | ICD-10-CM | POA: Diagnosis not present

## 2018-05-27 ENCOUNTER — Other Ambulatory Visit (HOSPITAL_COMMUNITY): Payer: Self-pay | Admitting: Respiratory Therapy

## 2018-05-27 DIAGNOSIS — J441 Chronic obstructive pulmonary disease with (acute) exacerbation: Secondary | ICD-10-CM

## 2018-06-11 ENCOUNTER — Other Ambulatory Visit (HOSPITAL_COMMUNITY): Payer: Self-pay | Admitting: Pulmonary Disease

## 2018-06-11 ENCOUNTER — Other Ambulatory Visit: Payer: Self-pay

## 2018-06-11 ENCOUNTER — Ambulatory Visit (HOSPITAL_COMMUNITY)
Admission: RE | Admit: 2018-06-11 | Discharge: 2018-06-11 | Disposition: A | Discharge status: Home / Self Care | Payer: 59 | Source: Ambulatory Visit | Attending: Pulmonary Disease | Admitting: Pulmonary Disease

## 2018-06-11 DIAGNOSIS — E1165 Type 2 diabetes mellitus with hyperglycemia: Secondary | ICD-10-CM | POA: Diagnosis not present

## 2018-06-11 DIAGNOSIS — J189 Pneumonia, unspecified organism: Secondary | ICD-10-CM | POA: Insufficient documentation

## 2018-06-11 DIAGNOSIS — J301 Allergic rhinitis due to pollen: Secondary | ICD-10-CM | POA: Diagnosis not present

## 2018-06-11 DIAGNOSIS — J449 Chronic obstructive pulmonary disease, unspecified: Secondary | ICD-10-CM | POA: Diagnosis not present

## 2018-07-10 ENCOUNTER — Other Ambulatory Visit (HOSPITAL_COMMUNITY): Payer: Self-pay | Admitting: Pulmonary Disease

## 2018-07-10 DIAGNOSIS — R609 Edema, unspecified: Secondary | ICD-10-CM

## 2018-07-16 ENCOUNTER — Other Ambulatory Visit: Payer: Self-pay

## 2018-07-16 ENCOUNTER — Ambulatory Visit (HOSPITAL_BASED_OUTPATIENT_CLINIC_OR_DEPARTMENT_OTHER)
Admission: RE | Admit: 2018-07-16 | Discharge: 2018-07-16 | Disposition: A | Discharge status: Home / Self Care | Payer: 59 | Source: Ambulatory Visit | Attending: Pulmonary Disease | Admitting: Pulmonary Disease

## 2018-07-16 DIAGNOSIS — R609 Edema, unspecified: Secondary | ICD-10-CM

## 2018-07-16 NOTE — Progress Notes (Signed)
*  PRELIMINARY RESULTS* Echocardiogram 2D Echocardiogram has been performed.  Leavy Cella 07/16/2018, 11:40 AM

## 2018-07-17 ENCOUNTER — Telehealth (INDEPENDENT_AMBULATORY_CARE_PROVIDER_SITE_OTHER): Payer: 59 | Admitting: Internal Medicine

## 2018-07-17 ENCOUNTER — Telehealth: Payer: Self-pay | Admitting: Internal Medicine

## 2018-07-17 ENCOUNTER — Encounter: Payer: Self-pay | Admitting: Internal Medicine

## 2018-07-17 VITALS — Ht 70.0 in

## 2018-07-17 DIAGNOSIS — I5043 Acute on chronic combined systolic (congestive) and diastolic (congestive) heart failure: Secondary | ICD-10-CM

## 2018-07-17 NOTE — Patient Instructions (Addendum)
Medication Instructions:   Your physician recommends that you continue on your current medications as directed. Please refer to the Current Medication list given to you today.  Labwork:  NONE  Testing/Procedures:  NONE  Follow-Up:  Your physician recommends that you schedule a follow-up appointment in: after admission   Any Other Special Instructions Will Be Listed Below (If Applicable).  If you need a refill on your cardiac medications before your next appointment, please call your pharmacy.

## 2018-07-17 NOTE — Telephone Encounter (Signed)

## 2018-07-17 NOTE — Progress Notes (Signed)
Virtual Visit via Video Note   This visit type was conducted due to national recommendations for restrictions regarding the COVID-19 Pandemic (e.g. social distancing) in an effort to limit this patient's exposure and mitigate transmission in our community.  Due to his co-morbid illnesses, this patient is at least at moderate risk for complications without adequate follow up.  This format is felt to be most appropriate for this patient at this time.  All issues noted in this document were discussed and addressed.  A limited physical exam was performed with this format.  Please refer to the patient's chart for his consent to telehealth for [facility].   Date:  07/17/2018   ID:  Fernando Williams, DOB 01/12/1961, MRN [id number]  Patient Location: Home Provider Location: Home  PCP:  Sinda Du, MD    Chief Complaint:  Pt presents on referral by Susann Givens for CHF   History of Present Illness:    Fernando Williams is a 58 y.o. male with hx of tobacco abuse, SOB  Pt says he was doing OK up until March 2020 Pt says that in earlier march he develped Upper resp symtpoms   Productive cough   Treated with ABX as outpt  Didn't improve   Went to [facility] for ABX IV (urgent care)  CXR with diffuse inflitrates  Swab for COVID   Rx erythromycin  Had allergic Rxn   Admittd to [facility]   COVid negative    Treated with IV ABX and sent home   On steroids for about 1 month    He has been back to work   Still feels bad   Dizzy at times   Pt says he has never had CP    Holding a lot of fluid     LEgs swollen   Legs started swelling 2 wks ago    Cant get work boots on   [employer] he is not steady on feet   Sometimes can lay flat   But congested  Uses 1 pillow Seen by Susann Givens in [place] 2 days ago  Ordered echo  Put on lasix  Echo done yesterday shows LVEF 15 to 20%   REstricitve filling pattern   RVEF mild to mod erately depressed  There was moderate pericardial effusion    The pt says that he has not responded much to  lasix he has taken   LEgs still swollen.     The patient does not have symptoms concerning for COVID-19 infection (fever, chills, cough, or new shortness of breath).    No past medical history on file. Past Surgical History:  Procedure Laterality Date  . FRACTURE SURGERY       Current Meds  Medication Sig  . albuterol (PROVENTIL HFA;VENTOLIN HFA) 108 (90 Base) MCG/ACT inhaler Inhale 2 puffs into the lungs 3 (three) times daily.  . furosemide (LASIX) 40 MG tablet Take 80 mg by mouth daily.   . Potassium Chloride ER 20 MEQ TBCR Take 20 tablets by mouth 2 (two) times a day.      Allergies:   Erythromycin and Penicillins   Social History   Tobacco Use  . Smoking status: Former Smoker    Types: Cigars    Quit date: 04/07/2018    Years since quitting: 0.2  . Smokeless tobacco: Never Used  Substance Use Topics  . Alcohol use: No  . Drug use: No     Family Hx: The patient's family history is not on file.  ROS:   Please see the history of present illness.     All other systems reviewed and are negative.   Prior CV studies:   The following studies were reviewed today:  IMPRESSIONS    1. The left ventricle has a visually estimated ejection fraction of 15-20%. The cavity size was mildly dilated. Left ventricular diastolic Doppler parameters are consistent with restrictive filling. Left ventricular diffuse hypokinesis.  2. The right ventricle has mildly to moderately reduced systolic function. The cavity was normal. There is no increase in right ventricular wall thickness. Right ventricular systolic pressure is moderately elevated with an estimated pressure of 45.2  mmHg.  3. Left atrial size was mildly dilated.  4. The aortic valve is tricuspid. Mild calcification of the aortic valve. Mild to moderate aortic annular calcification noted.  5. The mitral valve is grossly normal. Mild thickening of the mitral valve leaflet.  6. The tricuspid valve is grossly normal.  7. The  aortic root is normal in size and structure.  8. The inferior vena cava was dilated in size with >50% respiratory variability.  9. Moderate pericardial effusion. 10. The pericardial effusion is localized near the right atrium. 11. Left pleural effusion noted.  FINDINGS  Left Ventricle: The left ventricle has a visually estimated ejection fraction of. The cavity size was mildly dilated. There is borderline increase in left ventricular wall thickness. Left ventricular diastolic Doppler parameters are consistent with  restrictive filling. Left ventricular diffuse hypokinesis.  Right Ventricle: The right ventricle has moderately reduced systolic function. The cavity was normal. There is no increase in right ventricular wall thickness. Right ventricular systolic pressure is moderately elevated with an estimated pressure of 47.2  mmHg.  Left Atrium: Left atrial size was mildly dilated.  Right Atrium: Right atrial size was normal in size. Right atrial pressure is estimated at 10 mmHg.  Interatrial Septum: No atrial level shunt detected by color flow Doppler.  Pericardium: A moderately sized pericardial effusion is present. The pericardial effusion is localized near the right atrium. There is pleural effusion in the left lateral region.  Mitral Valve: The mitral valve is grossly normal. Mild thickening of the mitral valve leaflet. Mitral valve regurgitation is trivial by color flow Doppler.  Tricuspid Valve: The tricuspid valve is grossly normal. Tricuspid valve regurgitation is trivial by color flow Doppler.  Aortic Valve: The aortic valve is tricuspid Mild calcification of the aortic valve. Aortic valve regurgitation was not visualized by color flow Doppler. Mild to moderate aortic annular calcification noted.  Pulmonic Valve: The pulmonic valve was grossly normal. Pulmonic valve regurgitation is trivial by color flow Doppler.  Aorta: The aortic root is normal in size and structure.   Venous: The inferior vena cava is dilated in size with greater than 50% respiratory variability.    +--------------+--------++ LEFT VENTRICLE         +----------------+---------++ +--------------+--------++ Diastology                PLAX 2D                +----------------+---------++ +--------------+--------++ LV e' lateral:  5.77 cm/s LVIDd:        6.05 cm  +----------------+---------++ +--------------+--------++ LV E/e' lateral:13.6      LVIDs:        5.48 cm  +----------------+---------++ +--------------+--------++ LV e' medial:   4.35 cm/s LV PW:        1.16 cm  +----------------+---------++ +--------------+--------++ LV E/e' medial: 18.0      LV  IVS:       0.72 cm  +----------------+---------++ +--------------+--------++ LVOT diam:    1.90 cm  +--------------+--------++ LV SV:        37 ml    +--------------+--------++ LV SV Index:  16.18    +--------------+--------++ LVOT Area:    2.84 cm +--------------+--------++                        +--------------+--------++  +---------------+----------++ RIGHT VENTRICLE           +---------------+----------++ RV S prime:    10.20 cm/s +---------------+----------++ TAPSE (M-mode):1.7 cm     +---------------+----------++ RVSP:          45.2 mmHg  +---------------+----------++  +---------------+-------++-----------++ LEFT ATRIUM           Index       +---------------+-------++-----------++ LA diam:       5.10 cm2.30 cm/m  +---------------+-------++-----------++ LA Vol (A2C):  86.9 ml39.23 ml/m +---------------+-------++-----------++ LA Vol (A4C):  73.9 ml33.36 ml/m +---------------+-------++-----------++ LA Biplane Vol:88.0 ml39.73 ml/m +---------------+-------++-----------++ +------------+---------++-----------++ RIGHT ATRIUM         Index       +------------+---------++-----------++ RA  Pressure:8.00 mmHg            +------------+---------++-----------++ RA Area:    22.10 cm            +------------+---------++-----------++ RA Volume:  78.00 ml 35.21 ml/m +------------+---------++-----------++    +-------------+-------++ AORTA                +-------------+-------++ Ao Root diam:2.50 cm +-------------+-------++  +--------------+----------++ +---------------+-----------++ MITRAL VALVE             TRICUSPID VALVE            +--------------+----------++ +---------------+-----------++ MV Area (PHT):4.60 cm   TR Peak grad:  37.2 mmHg   +--------------+----------++ +---------------+-----------++ MV PHT:       47.85 msec TR Vmax:       305.00 cm/s +--------------+----------++ +---------------+-----------++ MV Decel Time:165 msec   Estimated RAP: 8.00 mmHg   +--------------+----------++ +---------------+-----------++ +-------------+-----------++ RVSP:          45.2 mmHg   MR Peak grad:68.2 mmHg   +---------------+-----------++ +-------------+-----------++ MR Mean grad:39.0 mmHg   +--------------+-------+ +-------------+-----------++ SHUNTS                MR Vmax:     413.00 cm/s +--------------+-------+ +-------------+-----------++ Systemic Diam:1.90 cm MR Vmean:    282.0 cm/s  +--------------+-------+ +-------------+-----------++ +--------------+----------++ MV E velocity:78.50 cm/s +--------------+----------++    Rozann Lesches MD Electronically signed by Rozann Lesches MD Signature Date/Time: 07/16/2018/12:00:52 PM     Labs/Other Tests and Data Reviewed:    EKG:  ON 3/25:   ST   117 bpm  Occasional PVC  LAA  Anteroseptal MI  Recent Labs: 04/10/2018: TSH 0.939 04/12/2018: ALT 65; BUN 28; Creatinine, Ser 0.88; Hemoglobin 16.2; Magnesium 2.3; Platelets 138; Potassium 5.1; Sodium 130   Recent Lipid Panel No results found for: CHOL, TRIG, HDL, CHOLHDL, LDLCALC, LDLDIRECT   Wt Readings from Last 3 Encounters:  04/09/18 230 lb (104.3 kg)  09/08/11 225 lb (102.1 kg)     Objective:    Vital Signs:  Ht 5\' 10"  (1.778 m)   BMI 33.00 kg/m   No vitals taken as pt does not have cuff  ASSESSMENT & PLAN:    1  Acute systolic CHF  Pt's history sugg that this began in March   Cannot be sure   March may have been CHF misdiagnosed  as respiratory infection.    On discussion with him he is symptomatic with SOB and edema   Not responding to lasix  I would recomm admitting the pt for inpatient diuresis.   He will need close f/u for labs, may need inotropic support    Start CHF meds  I have discussed this plan with E Hawkins as well Pt is agreeable with plan  COVID-19 Education: The signs and symptoms of COVID-19 were discussed with the patient and how to seek care for testing (follow up with PCP or arrange E-visit).  The importance of social distancing was discussed today.  Time:   Today, I have spent 20  minutes with the patient with telehealth technology discussing the above problems.     Medication Adjustments/Labs and Tests Ordered: Current medicines are reviewed at length with the patient today.  Concerns regarding medicines are outlined above.   Tests Ordered: No orders of the defined types were placed in this encounter.   Medication Changes: No orders of the defined types were placed in this encounter.   Follow Up:  F/u after admssion.   Signed, Dorris Carnes, MD  07/17/2018 2:12 PM    [place]

## 2018-07-18 ENCOUNTER — Encounter (HOSPITAL_COMMUNITY): Payer: Self-pay | Admitting: General Practice

## 2018-07-18 ENCOUNTER — Inpatient Hospital Stay (HOSPITAL_COMMUNITY)
Admission: AD | Admit: 2018-07-18 | Discharge: 2018-07-23 | DRG: 286 | Disposition: A | Discharge status: Home / Self Care | Payer: 59 | Source: Ambulatory Visit | Attending: Internal Medicine | Admitting: Internal Medicine

## 2018-07-18 ENCOUNTER — Other Ambulatory Visit: Payer: Self-pay

## 2018-07-18 DIAGNOSIS — I509 Heart failure, unspecified: Secondary | ICD-10-CM

## 2018-07-18 DIAGNOSIS — N170 Acute kidney failure with tubular necrosis: Secondary | ICD-10-CM | POA: Diagnosis present

## 2018-07-18 DIAGNOSIS — I428 Other cardiomyopathies: Secondary | ICD-10-CM | POA: Diagnosis present

## 2018-07-18 DIAGNOSIS — Z6830 Body mass index (BMI) 30.0-30.9, adult: Secondary | ICD-10-CM

## 2018-07-18 DIAGNOSIS — D751 Secondary polycythemia: Secondary | ICD-10-CM | POA: Diagnosis present

## 2018-07-18 DIAGNOSIS — I2510 Atherosclerotic heart disease of native coronary artery without angina pectoris: Secondary | ICD-10-CM | POA: Diagnosis present

## 2018-07-18 DIAGNOSIS — E1110 Type 2 diabetes mellitus with ketoacidosis without coma: Secondary | ICD-10-CM

## 2018-07-18 DIAGNOSIS — I313 Pericardial effusion (noninflammatory): Secondary | ICD-10-CM | POA: Diagnosis present

## 2018-07-18 DIAGNOSIS — I472 Ventricular tachycardia: Secondary | ICD-10-CM | POA: Diagnosis present

## 2018-07-18 DIAGNOSIS — I5021 Acute systolic (congestive) heart failure: Principal | ICD-10-CM | POA: Diagnosis present

## 2018-07-18 DIAGNOSIS — Z79899 Other long term (current) drug therapy: Secondary | ICD-10-CM

## 2018-07-18 DIAGNOSIS — E119 Type 2 diabetes mellitus without complications: Secondary | ICD-10-CM | POA: Diagnosis present

## 2018-07-18 DIAGNOSIS — Z1159 Encounter for screening for other viral diseases: Secondary | ICD-10-CM

## 2018-07-18 DIAGNOSIS — E876 Hypokalemia: Secondary | ICD-10-CM | POA: Diagnosis present

## 2018-07-18 DIAGNOSIS — E669 Obesity, unspecified: Secondary | ICD-10-CM | POA: Diagnosis present

## 2018-07-18 HISTORY — DX: Pneumonia, unspecified organism: J18.9

## 2018-07-18 HISTORY — DX: Heart failure, unspecified: I50.9

## 2018-07-18 LAB — CBC
HCT: 54.3 % — ABNORMAL HIGH (ref 39.0–52.0)
Hemoglobin: 17.6 g/dL — ABNORMAL HIGH (ref 13.0–17.0)
MCH: 31 pg (ref 26.0–34.0)
MCHC: 32.4 g/dL (ref 30.0–36.0)
MCV: 95.6 fL (ref 80.0–100.0)
Platelets: 254 10*3/uL (ref 150–400)
RBC: 5.68 MIL/uL (ref 4.22–5.81)
RDW: 14.5 % (ref 11.5–15.5)
WBC: 9.7 10*3/uL (ref 4.0–10.5)
nRBC: 0 % (ref 0.0–0.2)

## 2018-07-18 LAB — SARS CORONAVIRUS 2 BY RT PCR (HOSPITAL ORDER, PERFORMED IN ~~LOC~~ HOSPITAL LAB): SARS Coronavirus 2: NEGATIVE

## 2018-07-18 LAB — CREATININE, SERUM
Creatinine, Ser: 1.27 mg/dL — ABNORMAL HIGH (ref 0.61–1.24)
GFR calc Af Amer: >60 mL/min (ref >60)
GFR calc non Af Amer: >60 mL/min (ref >60)

## 2018-07-18 MED ORDER — FUROSEMIDE 10 MG/ML IJ SOLN
80.0000 mg | Freq: Two times a day (BID) | INTRAMUSCULAR | Status: DC
  Administered 2018-07-18 – 2018-07-21 (×7): 80 mg via INTRAVENOUS
  Filled 2018-07-18 (×8): qty 8

## 2018-07-18 MED ORDER — SODIUM CHLORIDE 0.9% FLUSH
3.0000 mL | Freq: Two times a day (BID) | INTRAVENOUS | Status: DC
  Administered 2018-07-18 – 2018-07-23 (×9): 3 mL via INTRAVENOUS

## 2018-07-18 MED ORDER — ACETAMINOPHEN 325 MG PO TABS: 650.0000 mg | ORAL_TABLET | ORAL | Status: DC | PRN

## 2018-07-18 MED ORDER — SODIUM CHLORIDE 0.9% FLUSH
3.0000 mL | INTRAVENOUS | Status: DC | PRN
  Administered 2018-07-20 – 2018-07-21 (×2): 3 mL via INTRAVENOUS
  Filled 2018-07-18 (×2): qty 3

## 2018-07-18 MED ORDER — FUROSEMIDE 10 MG/ML IJ SOLN: 80.0000 mg | Freq: Two times a day (BID) | INTRAMUSCULAR | Status: DC

## 2018-07-18 MED ORDER — HEPARIN SODIUM (PORCINE) 5000 UNIT/ML IJ SOLN
5000.0000 [IU] | Freq: Three times a day (TID) | INTRAMUSCULAR | Status: DC
  Administered 2018-07-18 – 2018-07-21 (×9): 5000 [IU] via SUBCUTANEOUS
  Filled 2018-07-18 (×8): qty 1

## 2018-07-18 MED ORDER — ASPIRIN EC 81 MG PO TBEC
81.0000 mg | DELAYED_RELEASE_TABLET | Freq: Every day | ORAL | Status: DC
  Administered 2018-07-18 – 2018-07-23 (×6): 81 mg via ORAL
  Filled 2018-07-18 (×6): qty 1

## 2018-07-18 MED ORDER — ONDANSETRON HCL 4 MG/2ML IJ SOLN: 4.0000 mg | Freq: Four times a day (QID) | INTRAMUSCULAR | Status: DC | PRN

## 2018-07-18 MED ORDER — SODIUM CHLORIDE 0.9 % IV SOLN: 250.0000 mL | INTRAVENOUS | Status: DC | PRN

## 2018-07-18 NOTE — Progress Notes (Signed)
   Cardiology Office Note   Date:  07/18/2018   ID:  Kirby, Cortese 12/15/1960, MRN [id number]  PCP:  Sinda Du, MD  Cardiologist:   Dorris Carnes, MD       History of Present Illness: Fernando Williams is a 58 y.o. male with a history of       Current Meds  Medication Sig  . albuterol (PROVENTIL HFA;VENTOLIN HFA) 108 (90 Base) MCG/ACT inhaler Inhale 2 puffs into the lungs 3 (three) times daily.  . furosemide (LASIX) 40 MG tablet Take 80 mg by mouth daily.   . Potassium Chloride ER 20 MEQ TBCR Take 20 tablets by mouth 2 (two) times a day.      Allergies:   Erythromycin and Penicillins   No past medical history on file.  Past Surgical History:  Procedure Laterality Date  . FRACTURE SURGERY       Social History:  The patient  reports that he quit smoking about 3 months ago. His smoking use included cigars. He has never used smokeless tobacco. He reports that he does not drink alcohol or use drugs.   Family History:  The patient's family history is not on file.    ROS:  Please see the history of present illness. All other systems are reviewed and  Negative to the above problem except as noted.    PHYSICAL EXAM: VS:  Ht 5\' 10"  (1.778 m)   BMI 33.00 kg/m   GEN: Well nourished, well developed, in no acute distress  HEENT: normal  Neck: no JVD, carotid bruits, or masses Cardiac: RRR; no murmurs, rubs, or gallops,no edema  Respiratory:  clear to auscultation bilaterally, normal work of breathing GI: soft, nontender, nondistended, + BS  No hepatomegaly  MS: no deformity Moving all extremities   Skin: warm and dry, no rash Neuro:  Strength and sensation are intact Psych: euthymic mood, full affect   EKG:  EKG is ordered today.   Lipid Panel No results found for: CHOL, TRIG, HDL, CHOLHDL, VLDL, LDLCALC, LDLDIRECT    Wt Readings from Last 3 Encounters:  04/09/18 230 lb (104.3 kg)  09/08/11 225 lb (102.1 kg)      ASSESSMENT AND PLAN:     Current  medicines are reviewed at length with the patient today.  The patient does not have concerns regarding medicines.  Signed, Dorris Carnes, MD  07/18/2018 8:16 AM    [facility] [address], [place], [place]  [address] Phone: [phone]; Fax: [phone]

## 2018-07-18 NOTE — Progress Notes (Signed)
Verified with pt who will be contact person that we need to call and give update. Per patient he is ok, he will call family if he needs to.

## 2018-07-18 NOTE — Plan of Care (Signed)
?  Problem: Education: ?Goal: Knowledge of General Education information will improve ?Description: Including pain rating scale, medication(s)/side effects and non-pharmacologic comfort measures ?Outcome: Progressing ?  ?Problem: Activity: ?Goal: Capacity to carry out activities will improve ?Outcome: Progressing ?  ?

## 2018-07-19 ENCOUNTER — Other Ambulatory Visit: Payer: Self-pay

## 2018-07-19 DIAGNOSIS — Z6830 Body mass index (BMI) 30.0-30.9, adult: Secondary | ICD-10-CM | POA: Diagnosis not present

## 2018-07-19 DIAGNOSIS — E876 Hypokalemia: Secondary | ICD-10-CM | POA: Diagnosis present

## 2018-07-19 DIAGNOSIS — I509 Heart failure, unspecified: Secondary | ICD-10-CM

## 2018-07-19 DIAGNOSIS — I5021 Acute systolic (congestive) heart failure: Secondary | ICD-10-CM | POA: Diagnosis present

## 2018-07-19 DIAGNOSIS — Z79899 Other long term (current) drug therapy: Secondary | ICD-10-CM | POA: Diagnosis not present

## 2018-07-19 DIAGNOSIS — I428 Other cardiomyopathies: Secondary | ICD-10-CM | POA: Diagnosis present

## 2018-07-19 DIAGNOSIS — I2510 Atherosclerotic heart disease of native coronary artery without angina pectoris: Secondary | ICD-10-CM | POA: Diagnosis present

## 2018-07-19 DIAGNOSIS — D751 Secondary polycythemia: Secondary | ICD-10-CM | POA: Diagnosis present

## 2018-07-19 DIAGNOSIS — I472 Ventricular tachycardia: Secondary | ICD-10-CM | POA: Diagnosis present

## 2018-07-19 DIAGNOSIS — I313 Pericardial effusion (noninflammatory): Secondary | ICD-10-CM | POA: Diagnosis present

## 2018-07-19 DIAGNOSIS — E669 Obesity, unspecified: Secondary | ICD-10-CM | POA: Diagnosis present

## 2018-07-19 DIAGNOSIS — N170 Acute kidney failure with tubular necrosis: Secondary | ICD-10-CM | POA: Diagnosis present

## 2018-07-19 DIAGNOSIS — Z1159 Encounter for screening for other viral diseases: Secondary | ICD-10-CM | POA: Diagnosis not present

## 2018-07-19 DIAGNOSIS — E119 Type 2 diabetes mellitus without complications: Secondary | ICD-10-CM | POA: Diagnosis present

## 2018-07-19 LAB — BASIC METABOLIC PANEL
Anion gap: 7 (ref 5–15)
BUN: 22 mg/dL — ABNORMAL HIGH (ref 6–20)
CO2: 30 mmol/L (ref 22–32)
Calcium: 8.9 mg/dL (ref 8.9–10.3)
Chloride: 99 mmol/L (ref 98–111)
Creatinine, Ser: 1.32 mg/dL — ABNORMAL HIGH (ref 0.61–1.24)
GFR calc Af Amer: >60 mL/min (ref >60)
GFR calc non Af Amer: 59 mL/min — ABNORMAL LOW (ref >60)
Glucose, Bld: 110 mg/dL — ABNORMAL HIGH (ref 70–99)
Potassium: 4.3 mmol/L (ref 3.5–5.1)
Sodium: 136 mmol/L (ref 135–145)

## 2018-07-19 MED ORDER — SPIRONOLACTONE 12.5 MG HALF TABLET
12.5000 mg | ORAL_TABLET | Freq: Every day | ORAL | Status: DC
  Administered 2018-07-19 – 2018-07-20 (×2): 12.5 mg via ORAL
  Filled 2018-07-19 (×2): qty 1

## 2018-07-19 MED ORDER — LOSARTAN POTASSIUM 25 MG PO TABS
25.0000 mg | ORAL_TABLET | Freq: Every day | ORAL | Status: DC
  Administered 2018-07-19 – 2018-07-22 (×4): 25 mg via ORAL
  Filled 2018-07-19 (×4): qty 1

## 2018-07-19 NOTE — Progress Notes (Signed)
Progress Note  Patient Name: Fernando Williams Date of Encounter: 07/19/2018  Primary Cardiologist: Dr Harrington Challenger  Subjective   No CP; some improvement in dyspnea  Inpatient Medications    Scheduled Meds: . aspirin EC  81 mg Oral Daily  . furosemide  80 mg Intravenous BID  . heparin  5,000 Units Subcutaneous Q8H  . sodium chloride flush  3 mL Intravenous Q12H   Continuous Infusions: . sodium chloride     PRN Meds: sodium chloride, acetaminophen, ondansetron (ZOFRAN) IV, sodium chloride flush   Vital Signs    Vitals:   07/18/18 2124 07/19/18 0041 07/19/18 0459 07/19/18 0556  BP: 110/69 101/83 (!) 97/58   Pulse: (!) 45 85 (!) 42   Resp: 20 20 18    Temp: 98.6 F (37 C) 97.6 F (36.4 C) 97.7 F (36.5 C)   TempSrc: Oral Oral Oral   SpO2: 97% 97% 94%   Weight:    102.2 kg  Height:        Intake/Output Summary (Last 24 hours) at 07/19/2018 0736 Last data filed at 07/19/2018 0044 Gross per 24 hour  Intake 120 ml  Output 2550 ml  Net -2430 ml   Last 3 Weights 07/19/2018 07/18/2018 04/09/2018  Weight (lbs) 225 lb 6.4 oz 228 lb 9 oz 230 lb  Weight (kg) 102.241 kg 103.675 kg 104.327 kg      Telemetry    Sinus with 3 beats NSVT- Personally Reviewed  Physical Exam   GEN: No acute distress.   Neck: supple Cardiac: RRR, no murmurs, rubs, or gallops.  Respiratory: Clear to auscultation bilaterally. GI: Soft, nontender, non-distended  MS: 1+ edema Neuro:  Nonfocal  Psych: Normal affect   Labs    Chemistry Recent Labs  Lab 07/18/18 1513 07/19/18 0343  NA  --  136  K  --  4.3  CL  --  99  CO2  --  30  GLUCOSE  --  110*  BUN  --  22*  CREATININE 1.27* 1.32*  CALCIUM  --  8.9  GFRNONAA >60 59*  GFRAA >60 >60  ANIONGAP  --  7     Hematology Recent Labs  Lab 07/18/18 1513  WBC 9.7  RBC 5.68  HGB 17.6*  HCT 54.3*  MCV 95.6  MCH 31.0  MCHC 32.4  RDW 14.5  PLT 254    Cardiac Studies   Echo 07/16/18 1. The left ventricle has a visually estimated ejection  fraction of 15-20%. The cavity size was mildly dilated. Left ventricular diastolic Doppler parameters are consistent with restrictive filling. Left ventricular diffuse hypokinesis. 2. The right ventricle has mildly to moderately reduced systolic function. The cavity was normal. There is no increase in right ventricular wall thickness. Right ventricular systolic pressure is moderately elevated with an estimated pressure of 45.2  mmHg. 3. Left atrial size was mildly dilated. 4. The aortic valve is tricuspid. Mild calcification of the aortic valve. Mild to moderate aortic annular calcification noted. 5. The mitral valve is grossly normal. Mild thickening of the mitral valve leaflet. 6. The tricuspid valve is grossly normal. 7. The aortic root is normal in size and structure. 8. The inferior vena cava was dilated in size with >50% respiratory variability. 9. Moderate pericardial effusion. 10. The pericardial effusion is localized near the right atrium. 11. Left pleural effusion noted.  Patient Profile     58 y.o. male admitted with acute systolic congestive heart failure.  Assessment & Plan    1 acute  systolic congestive heart failure-wt 102.2 kg; I/O -2430.  Patient remains volume overloaded.  Continue Lasix at present dose.  Add Spironolactone 12.5 mg daily.  Follow renal function closely.  2 cardiomyopathy-etiology unclear.  May be viral given URI symptoms in March.  No history of alcohol abuse.  Recent TSH and HIV negative.  Will likely need right and left cardiac catheterization prior to discharge.  We potentially could do this Monday.  Add losartan 25 mg daily.  I do not think his blood pressure will tolerate Entresto for now.  We will add carvedilol once his CHF improves.  3 acute kidney disease-creatinine mildly elevated.  Follow closely with diuresis.    For questions or updates, please contact [place] Please consult [email or web address] for contact info under         Signed, Kirk Ruths, MD  07/19/2018, 7:36 AM

## 2018-07-20 LAB — BASIC METABOLIC PANEL
Anion gap: 13 (ref 5–15)
BUN: 26 mg/dL — ABNORMAL HIGH (ref 6–20)
CO2: 29 mmol/L (ref 22–32)
Calcium: 8.9 mg/dL (ref 8.9–10.3)
Chloride: 96 mmol/L — ABNORMAL LOW (ref 98–111)
Creatinine, Ser: 1.31 mg/dL — ABNORMAL HIGH (ref 0.61–1.24)
GFR calc Af Amer: >60 mL/min (ref >60)
GFR calc non Af Amer: 59 mL/min — ABNORMAL LOW (ref >60)
Glucose, Bld: 120 mg/dL — ABNORMAL HIGH (ref 70–99)
Potassium: 3.9 mmol/L (ref 3.5–5.1)
Sodium: 138 mmol/L (ref 135–145)

## 2018-07-20 MED ORDER — POTASSIUM CHLORIDE CRYS ER 20 MEQ PO TBCR
40.0000 meq | EXTENDED_RELEASE_TABLET | Freq: Once | ORAL | Status: AC
  Administered 2018-07-20: 40 meq via ORAL
  Filled 2018-07-20: qty 2

## 2018-07-20 MED ORDER — SPIRONOLACTONE 25 MG PO TABS
25.0000 mg | ORAL_TABLET | Freq: Every day | ORAL | Status: DC
  Administered 2018-07-21 – 2018-07-23 (×3): 25 mg via ORAL
  Filled 2018-07-20 (×3): qty 1

## 2018-07-20 NOTE — Consult Note (Signed)
Advanced Heart Failure Team Consult Note   Primary Physician: Sinda Du, MD PCP-Cardiologist:  No primary care provider on file.  Reason for Consultation: Acute HF  Consulting MD: Stanford Breed  HPI:    Fernando Williams is a 58 y.o. male with hx of tobacco abuse, SOB, obesity and new onset systolic HF whom we are asked to see for further management of his HF.  Was apparently doing Ok until March. No previous known cardiac history. Developed URI symptoms and treated with abx as outpatient.  Didn't improve   Went to [facility] for ABX IV (urgent care)  CXR with diffuse inflitrates  Swab for COVID was negative.  Treated with IV ABX and sent home   On steroids for about 1 month  went back to work but didn't feel better with continues DOE, orthopnea, PND and LE edema. Denied CP but says chest feels tight when he is SOB.   Seen by Susann Givens in [place]. Ordered echo  Put on lasix. Echo EF 15-20%  Restricitve filling pattern   RVEF mild to mod erately depressed  There was moderate pericardial effusion    Weight down over past 2 days. Feeling better. SBP 98-108. Still mildly SOB. Denies snoring but says he lives alone and doesn't really know. Unsure what his BP has been but doesn't think it was that bad. Smokes cigars. No excessive ETOH or drugs.   Review of Systems: [y] = yes, [ ]  = no   . General: Weight gain Blue.Reese ]; Weight loss [ ] ; Anorexia [ ] ; Fatigue [ ] ; Fever [ ] ; Chills [ ] ; Weakness [ ]   . Cardiac: Chest pain/pressure [ ] ; Resting SOB Blue.Reese ]; Exertional SOB [ y]; Orthopnea Blue.Reese ]; Pedal Edema [ y]; Palpitations [ ] ; Syncope [ ] ; Presyncope [ ] ; Paroxysmal nocturnal dyspnea[y ]  . Pulmonary: Cough Blue.Reese ]; Wheezing[ ] ; Hemoptysis[ ] ; Sputum [ ] ; Snoring [ ]   . GI: Vomiting[ ] ; Dysphagia[ ] ; Melena[ ] ; Hematochezia [ ] ; Heartburn[ ] ; Abdominal pain [ ] ; Constipation [ ] ; Diarrhea [ ] ; BRBPR [ ]   . GU: Hematuria[ ] ; Dysuria [ ] ; Nocturia[ ]   . Vascular: Pain in legs with walking [ ] ; Pain in feet  with lying flat [ ] ; Non-healing sores [ ] ; Stroke [ ] ; TIA [ ] ; Slurred speech [ ] ;  . Neuro: Headaches[ ] ; Vertigo[ ] ; Seizures[ ] ; Paresthesias[ ] ;Blurred vision [ ] ; Diplopia [ ] ; Vision changes [ ]   . Ortho/Skin: Arthritis Blue.Reese ]; Joint pain Blue.Reese ]; Muscle pain [ ] ; Joint swelling [ ] ; Back Pain [ ] ; Rash [ ]   . Psych: Depression[ ] ; Anxiety[ ]   . Heme: Bleeding problems [ ] ; Clotting disorders [ ] ; Anemia [ ]   . Endocrine: Diabetes [ ] ; Thyroid dysfunction[ ]   Home Medications Prior to Admission medications   Medication Sig Start Date End Date Taking? Authorizing Provider  albuterol (PROVENTIL HFA;VENTOLIN HFA) 108 (90 Base) MCG/ACT inhaler Inhale 2 puffs into the lungs 3 (three) times daily. 04/12/18   Georgette Shell, MD  furosemide (LASIX) 40 MG tablet Take 80 mg by mouth daily.  07/08/18   [provider]  Potassium Chloride ER 20 MEQ TBCR Take 20 tablets by mouth 2 (two) times a day.  07/08/18   [provider]    Past Medical History: Past Medical History:  Diagnosis Date  . CHF (congestive heart failure) ([place])   . Pneumonia     Past Surgical History: Past Surgical History:  Procedure  Laterality Date  . FRACTURE SURGERY      Family History: History reviewed. No pertinent family history.  Social History: Social History   Socioeconomic History  . Marital status: Divorced    Spouse name: Not on file  . Number of children: Not on file  . Years of education: Not on file  . Highest education level: Not on file  Occupational History  . Not on file  Social Needs  . Financial resource strain: Not on file  . Food insecurity    Worry: Not on file    Inability: Not on file  . Transportation needs    Medical: Not on file    Non-medical: Not on file  Tobacco Use  . Smoking status: Former Smoker    Types: Cigars    Quit date: 04/07/2018    Years since quitting: 0.2  . Smokeless tobacco: Never Used  Substance and Sexual Activity  . Alcohol use: No   . Drug use: No  . Sexual activity: Yes    Birth control/protection: None  Lifestyle  . Physical activity    Days per week: Not on file    Minutes per session: Not on file  . Stress: Not on file  Relationships  . Social Herbalist on phone: Not on file    Gets together: Not on file    Attends religious service: Not on file    Active member of club or organization: Not on file    Attends meetings of clubs or organizations: Not on file    Relationship status: Not on file  Other Topics Concern  . Not on file  Social History Narrative  . Not on file    Allergies:  Allergies  Allergen Reactions  . Erythromycin Nausea And Vomiting  . Penicillins Other (See Comments)    Did it involve swelling of the face/tongue/throat, SOB, or low BP? No Did it involve sudden or severe rash/hives, skin peeling, or any reaction on the inside of your mouth or nose? Unknown Did you need to seek medical attention at a hospital or doctor's office? Unknown When did it last happen?childhood If all above answers are "NO", may proceed with cephalosporin use.      "[name]" - STATES THIS OCCURS WITH ALL  "[name]"    Objective:    Vital Signs:   Temp:  [97.6 F (36.4 C)-98.6 F (37 C)] 97.8 F (36.6 C) (07/05 0500) Pulse Rate:  [51-91] 89 (07/05 0815) Resp:  [18-20] 18 (07/04 1933) BP: (102-111)/(66-83) 108/81 (07/05 0815) SpO2:  [92 %-97 %] 96 % (07/05 0815) Weight:  [99.4 kg] 99.4 kg (07/05 0500) Last BM Date: 07/19/18  Weight change: Filed Weights   07/18/18 1422 07/19/18 0556 07/20/18 0500  Weight: 103.7 kg 102.2 kg 99.4 kg    Intake/Output:   Intake/Output Summary (Last 24 hours) at 07/20/2018 0843 Last data filed at 07/20/2018 0200 Gross per 24 hour  Intake 1011 ml  Output 4025 ml  Net -3014 ml      Physical Exam    General:  Sitting up in bed. No resp difficulty HEENT: normal Neck: supple. JVP to ear . Carotids 2+ bilat; no bruits. No  lymphadenopathy or thyromegaly appreciated. Cor: PMI nondisplaced. Regular mildly tachy. No rubs, gallops or murmurs. Lungs: clear Abdomen: soft, nontender, + distended. No hepatosplenomegaly. No bruits or masses. Good bowel sounds. Extremities: no cyanosis, clubbing, rash, 2+ edema Neuro: alert & orientedx3, cranial nerves grossly intact. moves all  4 extremities w/o difficulty. Affect pleasant   Telemetry   NSR 80-90s with occasional PVCs (up to 3-4/min maximum) Personally reviewed   EKG    Sinus tach 117 PRWP. occasioanl PVC. QRS 36ms Personally reviewed   Labs   Basic Metabolic Panel: Recent Labs  Lab 07/18/18 1513 07/19/18 0343 07/20/18 0432  NA  --  136 138  K  --  4.3 3.9  CL  --  99 96*  CO2  --  30 29  GLUCOSE  --  110* 120*  BUN  --  22* 26*  CREATININE 1.27* 1.32* 1.31*  CALCIUM  --  8.9 8.9    Liver Function Tests: No results for input(s): AST, ALT, ALKPHOS, BILITOT, PROT, ALBUMIN in the last 168 hours. No results for input(s): LIPASE, AMYLASE in the last 168 hours. No results for input(s): AMMONIA in the last 168 hours.  CBC: Recent Labs  Lab 07/18/18 1513  WBC 9.7  HGB 17.6*  HCT 54.3*  MCV 95.6  PLT 254    Cardiac Enzymes: No results for input(s): CKTOTAL, CKMB, CKMBINDEX, TROPONINI in the last 168 hours.  BNP: BNP (last 3 results) No results for input(s): BNP in the last 8760 hours.  ProBNP (last 3 results) No results for input(s): PROBNP in the last 8760 hours.   CBG: No results for input(s): GLUCAP in the last 168 hours.  Coagulation Studies: No results for input(s): LABPROT, INR in the last 72 hours.   Imaging    No results found.   Medications:     Current Medications: . aspirin EC  81 mg Oral Daily  . furosemide  80 mg Intravenous BID  . heparin  5,000 Units Subcutaneous Q8H  . losartan  25 mg Oral Daily  . sodium chloride flush  3 mL Intravenous Q12H  . spironolactone  12.5 mg Oral Daily     Infusions: .  sodium chloride         Assessment/Plan   1. Acute systolic HF with biventricular dysfunction - Echo 07/16/18 EF 15-20%  Restricitve filling pattern   RVEF mild to moderately depressed  There was moderate pericardial effusion   - Agree that this is likely NICM (viral vs OSA) but with CRFs will need R/L cath when further diuresed to exclude critical CAD - He remains significantly volume overloaded.  - Continue IV lasix. Increase spiro to 25. Place TED hose.  - Continue losartan 25 for now. Hopefully can titrate or switch to [facility] after cath - add digoxin 0.125 - No b-blocker yet  - Consider cMRI  2. Polycythemia - suspect severe OSA - will need PSG as outpatient  3. Hypokalemia - supp. Increase spiro   4. AKI - creatinine 0.9 -> 1.3 - likely due to low output ATN.  - watch closely with diuresis. Does not seem overtly low output currently.   5. Obesity - needs dietary modification - check lipids and HgbAic (CBGs in 300s in 3/20)   Length of Stay: 1  Glori Bickers, MD  07/20/2018, 8:43 AM  Advanced Heart Failure Team Pager [phone] (M-F; 7a - 4p)  Please contact [facility] for night-coverage after hours (4p -7a ) and weekends on [email or web address]

## 2018-07-21 ENCOUNTER — Encounter (HOSPITAL_COMMUNITY)
Admission: AD | Disposition: A | Discharge status: Home / Self Care | Payer: Self-pay | Source: Ambulatory Visit | Attending: Internal Medicine

## 2018-07-21 DIAGNOSIS — I472 Ventricular tachycardia: Secondary | ICD-10-CM

## 2018-07-21 HISTORY — PX: RIGHT/LEFT HEART CATH AND CORONARY ANGIOGRAPHY: CATH118266

## 2018-07-21 LAB — POCT I-STAT 7, (LYTES, BLD GAS, ICA,H+H)
Acid-Base Excess: 1 mmol/L (ref 0.0–2.0)
Bicarbonate: 25.1 mmol/L (ref 20.0–28.0)
Calcium, Ion: 0.89 mmol/L — CL (ref 1.15–1.40)
HCT: 46 % (ref 39.0–52.0)
Hemoglobin: 15.6 g/dL (ref 13.0–17.0)
O2 Saturation: 97 %
Potassium: 3.3 mmol/L — ABNORMAL LOW (ref 3.5–5.1)
Sodium: 144 mmol/L (ref 135–145)
TCO2: 26 mmol/L (ref 22–32)
pCO2 arterial: 37.2 mmHg (ref 32.0–48.0)
pH, Arterial: 7.436 (ref 7.350–7.450)
pO2, Arterial: 92 mmHg (ref 83.0–108.0)

## 2018-07-21 LAB — BASIC METABOLIC PANEL
Anion gap: 13 (ref 5–15)
BUN: 25 mg/dL — ABNORMAL HIGH (ref 6–20)
CO2: 31 mmol/L (ref 22–32)
Calcium: 9 mg/dL (ref 8.9–10.3)
Chloride: 97 mmol/L — ABNORMAL LOW (ref 98–111)
Creatinine, Ser: 1.27 mg/dL — ABNORMAL HIGH (ref 0.61–1.24)
GFR calc Af Amer: >60 mL/min (ref >60)
GFR calc non Af Amer: >60 mL/min (ref >60)
Glucose, Bld: 126 mg/dL — ABNORMAL HIGH (ref 70–99)
Potassium: 3.9 mmol/L (ref 3.5–5.1)
Sodium: 141 mmol/L (ref 135–145)

## 2018-07-21 LAB — POCT I-STAT EG7
Acid-Base Excess: 3 mmol/L — ABNORMAL HIGH (ref 0.0–2.0)
Bicarbonate: 28.7 mmol/L — ABNORMAL HIGH (ref 20.0–28.0)
Calcium, Ion: 1.03 mmol/L — ABNORMAL LOW (ref 1.15–1.40)
HCT: 49 % (ref 39.0–52.0)
Hemoglobin: 16.7 g/dL (ref 13.0–17.0)
O2 Saturation: 69 %
Potassium: 3.6 mmol/L (ref 3.5–5.1)
Sodium: 142 mmol/L (ref 135–145)
TCO2: 30 mmol/L (ref 22–32)
pCO2, Ven: 46.1 mmHg (ref 44.0–60.0)
pH, Ven: 7.403 (ref 7.250–7.430)
pO2, Ven: 36 mmHg (ref 32.0–45.0)

## 2018-07-21 LAB — HEMOGLOBIN A1C
Hgb A1c MFr Bld: 8.6 % — ABNORMAL HIGH (ref 4.8–5.6)
Mean Plasma Glucose: 200.12 mg/dL

## 2018-07-21 LAB — LIPID PANEL
Cholesterol: 146 mg/dL (ref 0–200)
HDL: 31 mg/dL — ABNORMAL LOW (ref >40)
LDL Cholesterol: 99 mg/dL (ref 0–99)
Total CHOL/HDL Ratio: 4.7 RATIO
Triglycerides: 81 mg/dL (ref <150)
VLDL: 16 mg/dL (ref 0–40)

## 2018-07-21 LAB — MAGNESIUM: Magnesium: 2 mg/dL (ref 1.7–2.4)

## 2018-07-21 SURGERY — RIGHT/LEFT HEART CATH AND CORONARY ANGIOGRAPHY
Anesthesia: LOCAL

## 2018-07-21 MED ORDER — SODIUM CHLORIDE 0.9 % IV SOLN
INTRAVENOUS | Status: DC
  Administered 2018-07-21: 14:00:00 via INTRAVENOUS

## 2018-07-21 MED ORDER — LIVING WELL WITH DIABETES BOOK
Freq: Once | Status: AC
  Administered 2018-07-21: 13:00:00
  Filled 2018-07-21: qty 1

## 2018-07-21 MED ORDER — ENOXAPARIN SODIUM 40 MG/0.4ML ~~LOC~~ SOLN
40.0000 mg | SUBCUTANEOUS | Status: DC
  Administered 2018-07-22 – 2018-07-23 (×2): 40 mg via SUBCUTANEOUS
  Filled 2018-07-21 (×2): qty 0.4

## 2018-07-21 MED ORDER — LABETALOL HCL 5 MG/ML IV SOLN: 10.0000 mg | INTRAVENOUS | Status: AC | PRN

## 2018-07-21 MED ORDER — DIGOXIN 125 MCG PO TABS
0.1250 mg | ORAL_TABLET | Freq: Every day | ORAL | Status: DC
  Administered 2018-07-21 – 2018-07-23 (×3): 0.125 mg via ORAL
  Filled 2018-07-21 (×3): qty 1

## 2018-07-21 MED ORDER — ONDANSETRON HCL 4 MG/2ML IJ SOLN: 4.0000 mg | Freq: Four times a day (QID) | INTRAMUSCULAR | Status: DC | PRN

## 2018-07-21 MED ORDER — HEPARIN SODIUM (PORCINE) 1000 UNIT/ML IJ SOLN
INTRAMUSCULAR | Status: DC | PRN
  Administered 2018-07-21: 4000 [IU] via INTRAVENOUS

## 2018-07-21 MED ORDER — MIDAZOLAM HCL 2 MG/2ML IJ SOLN
INTRAMUSCULAR | Status: DC | PRN
  Administered 2018-07-21: 1 mg via INTRAVENOUS

## 2018-07-21 MED ORDER — POTASSIUM CHLORIDE CRYS ER 20 MEQ PO TBCR
40.0000 meq | EXTENDED_RELEASE_TABLET | Freq: Once | ORAL | Status: AC
  Administered 2018-07-21: 40 meq via ORAL
  Filled 2018-07-21: qty 2

## 2018-07-21 MED ORDER — SODIUM CHLORIDE 0.9% FLUSH: 3.0000 mL | INTRAVENOUS | Status: DC | PRN

## 2018-07-21 MED ORDER — HEPARIN (PORCINE) IN NACL 1000-0.9 UT/500ML-% IV SOLN
INTRAVENOUS | Status: DC | PRN
  Administered 2018-07-21 (×2): 500 mL

## 2018-07-21 MED ORDER — MIDAZOLAM HCL 2 MG/2ML IJ SOLN
INTRAMUSCULAR | Status: AC
  Filled 2018-07-21: qty 2

## 2018-07-21 MED ORDER — HYDRALAZINE HCL 20 MG/ML IJ SOLN: 10.0000 mg | INTRAMUSCULAR | Status: AC | PRN

## 2018-07-21 MED ORDER — SODIUM CHLORIDE 0.9% FLUSH
3.0000 mL | Freq: Two times a day (BID) | INTRAVENOUS | Status: DC
  Administered 2018-07-22 – 2018-07-23 (×3): 3 mL via INTRAVENOUS

## 2018-07-21 MED ORDER — VERAPAMIL HCL 2.5 MG/ML IV SOLN
INTRAVENOUS | Status: AC
  Filled 2018-07-21: qty 2

## 2018-07-21 MED ORDER — ACETAMINOPHEN 325 MG PO TABS: 650.0000 mg | ORAL_TABLET | ORAL | Status: DC | PRN

## 2018-07-21 MED ORDER — SODIUM CHLORIDE 0.9 % IV SOLN: 250.0000 mL | INTRAVENOUS | Status: DC | PRN

## 2018-07-21 MED ORDER — HEPARIN (PORCINE) IN NACL 1000-0.9 UT/500ML-% IV SOLN
INTRAVENOUS | Status: AC
  Filled 2018-07-21: qty 1000

## 2018-07-21 MED ORDER — FENTANYL CITRATE (PF) 100 MCG/2ML IJ SOLN
INTRAMUSCULAR | Status: AC
  Filled 2018-07-21: qty 2

## 2018-07-21 MED ORDER — LIDOCAINE HCL (PF) 1 % IJ SOLN
INTRAMUSCULAR | Status: DC | PRN
  Administered 2018-07-21: 5 mL

## 2018-07-21 MED ORDER — SODIUM CHLORIDE 0.9 % IV SOLN: INTRAVENOUS | Status: AC

## 2018-07-21 MED ORDER — HEPARIN SODIUM (PORCINE) 1000 UNIT/ML IJ SOLN
INTRAMUSCULAR | Status: AC
  Filled 2018-07-21: qty 1

## 2018-07-21 MED ORDER — IOHEXOL 350 MG/ML SOLN
INTRAVENOUS | Status: DC | PRN
  Administered 2018-07-21: 70 mL via INTRA_ARTERIAL

## 2018-07-21 MED ORDER — SODIUM CHLORIDE 0.9% FLUSH: 3.0000 mL | Freq: Two times a day (BID) | INTRAVENOUS | Status: DC

## 2018-07-21 MED ORDER — LIDOCAINE HCL (PF) 1 % IJ SOLN
INTRAMUSCULAR | Status: AC
  Filled 2018-07-21: qty 30

## 2018-07-21 MED ORDER — FENTANYL CITRATE (PF) 100 MCG/2ML IJ SOLN
INTRAMUSCULAR | Status: DC | PRN
  Administered 2018-07-21: 25 ug via INTRAVENOUS

## 2018-07-21 MED ORDER — VERAPAMIL HCL 2.5 MG/ML IV SOLN
INTRAVENOUS | Status: DC | PRN
  Administered 2018-07-21: 10 mL via INTRA_ARTERIAL

## 2018-07-21 SURGICAL SUPPLY — 12 items
CATH 5FR JL3.5 JR4 ANG PIG MP (CATHETERS) ×2 IMPLANT
CATH BALLN WEDGE 5F 110CM (CATHETERS) ×2 IMPLANT
COVER DOME SNAP 22 D (MISCELLANEOUS) ×2 IMPLANT
DEVICE RAD COMP TR BAND LRG (VASCULAR PRODUCTS) ×2 IMPLANT
GLIDESHEATH SLEND SS 6F .021 (SHEATH) ×2 IMPLANT
GUIDEWIRE INQWIRE 1.5J.035X260 (WIRE) ×1 IMPLANT
INQWIRE 1.5J .035X260CM (WIRE) ×2
KIT HEART LEFT (KITS) ×2 IMPLANT
PACK CARDIAC CATHETERIZATION (CUSTOM PROCEDURE TRAY) ×2 IMPLANT
SHEATH GLIDE SLENDER 4/5FR (SHEATH) ×2 IMPLANT
TRANSDUCER W/STOPCOCK (MISCELLANEOUS) ×2 IMPLANT
TUBING CIL FLEX 10 FLL-RA (TUBING) ×2 IMPLANT

## 2018-07-21 NOTE — Research (Signed)
PHDEV Informed Consent   Subject Name: Fernando Williams  Subject met inclusion and exclusion criteria.  The informed consent form, study requirements and expectations were reviewed with the subject and questions and concerns were addressed prior to the signing of the consent form.  The subject verbalized understanding of the trial requirements.  The subject agreed to participate in the [facility] trial and signed the informed consent at 0925 on 07-21-2018.  The informed consent was obtained prior to performance of any protocol-specific procedures for the subject.  A copy of the signed informed consent was given to the subject and a copy was placed in the subject's medical record.   TERRELL, AMY

## 2018-07-21 NOTE — Plan of Care (Signed)
  Problem: Activity: Goal: Capacity to carry out activities will improve Outcome: Adequate for Discharge   

## 2018-07-21 NOTE — Progress Notes (Signed)
Advanced Heart Failure Rounding Note  PCP-Cardiologist: No primary care provider on file.   Subjective:    Yesterday diuresed with IV lasix. Negative> 2 liters. Weight down another 10 pounds - 19 pounds total.  Denies SOB. Denies chest pain.No orthopnea or PND. HgBa1c 8.6. Renal function stable.    Objective:   Weight Range: 95.2 kg Body mass index is 30.12 kg/m.   Vital Signs:   Temp:  [97.9 F (36.6 C)-98.3 F (36.8 C)] 98.2 F (36.8 C) (07/06 0704) Pulse Rate:  [84-95] 85 (07/06 0953) Resp:  [18-20] 18 (07/06 0704) BP: (102-117)/(74-96) 103/74 (07/06 0952) SpO2:  [94 %-96 %] 95 % (07/06 0704) Weight:  [95.2 kg] 95.2 kg (07/06 0704) Last BM Date: 07/20/18  Weight change: Filed Weights   07/19/18 0556 07/20/18 0500 07/21/18 0704  Weight: 102.2 kg 99.4 kg 95.2 kg    Intake/Output:   Intake/Output Summary (Last 24 hours) at 07/21/2018 0958 Last data filed at 07/21/2018 0705 Gross per 24 hour  Intake 720 ml  Output 3850 ml  Net -3130 ml      Physical Exam    General:  Well appearing. No resp difficulty HEENT: Normal Neck: Supple. JVP 11-12 . Carotids 2+ bilat; no bruits. No lymphadenopathy or thyromegaly appreciated. Cor: PMI nondisplaced. Regular rate & rhythm. No rubs, gallops or murmurs. Lungs: Clear Abdomen: obese Soft, nontender, nondistended. No hepatosplenomegaly. No bruits or masses. Good bowel sounds. Extremities: No cyanosis, clubbing, rash,  R and LLE 2+ edema + TED hose Neuro: Alert & orientedx3, cranial nerves grossly intact. moves all 4 extremities w/o difficulty. Affect pleasant   Telemetry   NSR 90s PVCs and NSVT personally reviewed.   EKG    N/A   Labs    CBC Recent Labs    07/18/18 1513  WBC 9.7  HGB 17.6*  HCT 54.3*  MCV 95.6  PLT 549   Basic Metabolic Panel Recent Labs    07/20/18 0432 07/21/18 0557  NA 138 141  K 3.9 3.9  CL 96* 97*  CO2 29 31  GLUCOSE 120* 126*  BUN 26* 25*  CREATININE 1.31* 1.27*  CALCIUM  8.9 9.0   Liver Function Tests No results for input(s): AST, ALT, ALKPHOS, BILITOT, PROT, ALBUMIN in the last 72 hours. No results for input(s): LIPASE, AMYLASE in the last 72 hours. Cardiac Enzymes No results for input(s): CKTOTAL, CKMB, CKMBINDEX, TROPONINI in the last 72 hours.  BNP: BNP (last 3 results) No results for input(s): BNP in the last 8760 hours.  ProBNP (last 3 results) No results for input(s): PROBNP in the last 8760 hours.   D-Dimer No results for input(s): DDIMER in the last 72 hours. Hemoglobin A1C Recent Labs    07/21/18 0557  HGBA1C 8.6*   Fasting Lipid Panel Recent Labs    07/21/18 0557  CHOL 146  HDL 31*  LDLCALC 99  TRIG 81  CHOLHDL 4.7   Thyroid Function Tests No results for input(s): TSH, T4TOTAL, T3FREE, THYROIDAB in the last 72 hours.  Invalid input(s): FREET3  Other results:   Imaging    No results found.   Medications:     Scheduled Medications: . aspirin EC  81 mg Oral Daily  . digoxin  0.125 mg Oral Daily  . furosemide  80 mg Intravenous BID  . heparin  5,000 Units Subcutaneous Q8H  . losartan  25 mg Oral Daily  . sodium chloride flush  3 mL Intravenous Q12H  . spironolactone  25 mg  Oral Daily    Infusions: . sodium chloride      PRN Medications: sodium chloride, acetaminophen, ondansetron (ZOFRAN) IV, sodium chloride flush    Patient Profile  Fernando Williams a 58 y.o.malewith hx of tobacco abuse, SOB, obesity and new onset systolic HF.    Assessment/Plan  1. Acute systolic HF with biventricular dysfunction - Echo 07/16/18 EF 15-20%Restricitve filling pattern RVEF mild to moderately depressed There was moderate pericardial effusion  - Agree that this is likely NICM (viral vs OSA) but with CRFs will need R/L cath when further diuresed to exclude critical CAD Volume status improving but still volume overloaded. Continue IV lasix.  - Continue spiro to 25.  - Continue losartan 25 for now. Hopefully can  titrate or switch to [facility] after cath - Add digoxin 0.125 - No b-blocker yet  - Consider cMRI - Consult cardiac rehab.  - R/L cath today  2. Polycythemia - suspect severe OSA - will need PSG as outpatient  3. Hypokalemia - Resolved.   4. AKI - creatinine 0.9 -> 1.3-> 1.27  - likely due to low output ATN.  - watch closely with diuresis. Does not seem overtly low output currently.   5. Obesity - needs dietary modification - check lipids   6. DMII HGb A1C 8.6. Says his PCP wanted him check CBG but to wait on metformin.  - Consult diabetes coordinator.  7. NSVT-   - May need Life Vest at discharge. NSVT last night.  - K 3.9. Check MAg.  - Keep K > 4.0. Mg > 2.0    Length of Stay: 2  Glori Bickers, MD  07/21/2018, 9:58 AM  Advanced Heart Failure Team Pager [phone] (M-F; 7a - 4p)  Please contact [facility] for night-coverage after hours (4p -7a ) and weekends on [email or web address]   Patient seen and examined with the above-signed Advanced Practice Provider and/or Housestaff. I personally reviewed laboratory data, imaging studies and relevant notes. I independently examined the patient and formulated the important aspects of the plan. I have edited the note to reflect any of my changes or salient points. I have personally discussed the plan with the patient and/or family.  Volume status improving but still has some volume on board. Diuresing well. Had some NSVT overnight. Continue IV diuresis. Plan R/L cath later today.   Having some NSVT. Supp lytes. May need LifeVest prior to d/c. If cath without CAD with need cMRI. Titrate HF meds.   HgbA1c 8.6/ will need metformin and SGLT2i.  Glori Bickers, MD  10:04 AM

## 2018-07-21 NOTE — Interval H&P Note (Signed)
History and Physical Interval Note:  07/21/2018 1:47 PM  Fernando Williams  has presented today for surgery, with the diagnosis of HF.  The various methods of treatment have been discussed with the patient and family. After consideration of risks, benefits and other options for treatment, the patient has consented to  Procedure(s): RIGHT/LEFT HEART CATH AND CORONARY ANGIOGRAPHY (N/A) and possible coronary angioplasty as a surgical intervention.  The patient's history has been reviewed, patient examined, no change in status, stable for surgery.  I have reviewed the patient's chart and labs.  Questions were answered to the patient's satisfaction.     Moria Brophy

## 2018-07-21 NOTE — Progress Notes (Addendum)
Inpatient Diabetes Program Recommendations  AACE/ADA: New Consensus Statement on Inpatient Glycemic Control (2015)  Target Ranges:  Prepandial:   less than 140 mg/dL      Peak postprandial:   less than 180 mg/dL (1-2 hours)      Critically ill patients:  140 - 180 mg/dL   Results for DAESHAUN, SPECHT (MRN [id number]) as of 07/21/2018 10:11  Ref. Range 07/19/2018 03:43 07/20/2018 04:32 07/21/2018 05:57  Glucose Latest Ref Range: 70 - 99 mg/dL 110 (H) 120 (H) 126 (H)   Results for CHAZ, MCGLASSON (MRN [id number]) as of 07/21/2018 10:11  Ref. Range 07/21/2018 05:57  Hemoglobin A1C Latest Ref Range: 4.8 - 5.6 % 8.6 (H)  (200 mg/dl)    Admit with: Acute systolic HF with biventricular dysfunction  New Diagnosis of Diabetes as well  Current Orders: None      Note this a new diagnosis of Diabetes for this patient.  Will order educational materials for this patient and speak with him today.  Will also order RD consult and ask RNs caring for pt to continue basic DM education at bedside.    MD- Patient may benefit form oral medication at time of discharge.  Given CHF, may consider Jardiance for home 10 mg Daily to start  Please also place orders for Novolog Sensitive Correction Scale/ SSI (0-9 units) TID AC + HS while patient in hospital      Addendum 1:30pm-  Met with pt today to discuss new diagnosis of DM.  Pt told me he was getting Steroids in March and April.  States his A1c rose to 13% and his PCP Dr. Luan Pulling gave him a Rx for CBG meter.  Has been checking his CBGs about twice a day at home.  States his CBGs have been running low 100s.  States his PCP Dr. Luan Pulling gave him a Rx for Metformin but told him not to start the metformin yet.  Spoke with pt about new diagnosis.  Discussed A1C results with him and explained what an A1C is, basic pathophysiology of DM Type 2, basic home care, basic diabetes diet nutrition principles, importance of checking CBGs and maintaining good CBG control to  prevent long-term and short-term complications.  Reviewed signs and symptoms of hyperglycemia and hypoglycemia and how to treat hypoglycemia at home.  Also reviewed blood sugar goals and A1c goals for home.    RNs to provide ongoing basic DM education at bedside with this patient.  Have ordered educational booklet.  Have also placed RD consult for DM diet education for this patient.      --Will follow patient during hospitalization--  Wyn Quaker RN, MSN, CDE Diabetes Coordinator Inpatient Glycemic Control Team Team Pager: [phone] (8a-5p)

## 2018-07-21 NOTE — H&P (View-Only) (Signed)
Advanced Heart Failure Rounding Note  PCP-Cardiologist: No primary care provider on file.   Subjective:    Yesterday diuresed with IV lasix. Negative> 2 liters. Weight down another 10 pounds - 19 pounds total.  Denies SOB. Denies chest pain.No orthopnea or PND. HgBa1c 8.6. Renal function stable.    Objective:   Weight Range: 95.2 kg Body mass index is 30.12 kg/m.   Vital Signs:   Temp:  [97.9 F (36.6 C)-98.3 F (36.8 C)] 98.2 F (36.8 C) (07/06 0704) Pulse Rate:  [84-95] 85 (07/06 0953) Resp:  [18-20] 18 (07/06 0704) BP: (102-117)/(74-96) 103/74 (07/06 0952) SpO2:  [94 %-96 %] 95 % (07/06 0704) Weight:  [95.2 kg] 95.2 kg (07/06 0704) Last BM Date: 07/20/18  Weight change: Filed Weights   07/19/18 0556 07/20/18 0500 07/21/18 0704  Weight: 102.2 kg 99.4 kg 95.2 kg    Intake/Output:   Intake/Output Summary (Last 24 hours) at 07/21/2018 0958 Last data filed at 07/21/2018 0705 Gross per 24 hour  Intake 720 ml  Output 3850 ml  Net -3130 ml      Physical Exam    General:  Well appearing. No resp difficulty HEENT: Normal Neck: Supple. JVP 11-12 . Carotids 2+ bilat; no bruits. No lymphadenopathy or thyromegaly appreciated. Cor: PMI nondisplaced. Regular rate & rhythm. No rubs, gallops or murmurs. Lungs: Clear Abdomen: obese Soft, nontender, nondistended. No hepatosplenomegaly. No bruits or masses. Good bowel sounds. Extremities: No cyanosis, clubbing, rash,  R and LLE 2+ edema + TED hose Neuro: Alert & orientedx3, cranial nerves grossly intact. moves all 4 extremities w/o difficulty. Affect pleasant   Telemetry   NSR 90s PVCs and NSVT personally reviewed.   EKG    N/A   Labs    CBC Recent Labs    07/18/18 1513  WBC 9.7  HGB 17.6*  HCT 54.3*  MCV 95.6  PLT 878   Basic Metabolic Panel Recent Labs    07/20/18 0432 07/21/18 0557  NA 138 141  K 3.9 3.9  CL 96* 97*  CO2 29 31  GLUCOSE 120* 126*  BUN 26* 25*  CREATININE 1.31* 1.27*  CALCIUM  8.9 9.0   Liver Function Tests No results for input(s): AST, ALT, ALKPHOS, BILITOT, PROT, ALBUMIN in the last 72 hours. No results for input(s): LIPASE, AMYLASE in the last 72 hours. Cardiac Enzymes No results for input(s): CKTOTAL, CKMB, CKMBINDEX, TROPONINI in the last 72 hours.  BNP: BNP (last 3 results) No results for input(s): BNP in the last 8760 hours.  ProBNP (last 3 results) No results for input(s): PROBNP in the last 8760 hours.   D-Dimer No results for input(s): DDIMER in the last 72 hours. Hemoglobin A1C Recent Labs    07/21/18 0557  HGBA1C 8.6*   Fasting Lipid Panel Recent Labs    07/21/18 0557  CHOL 146  HDL 31*  LDLCALC 99  TRIG 81  CHOLHDL 4.7   Thyroid Function Tests No results for input(s): TSH, T4TOTAL, T3FREE, THYROIDAB in the last 72 hours.  Invalid input(s): FREET3  Other results:   Imaging    No results found.   Medications:     Scheduled Medications: . aspirin EC  81 mg Oral Daily  . digoxin  0.125 mg Oral Daily  . furosemide  80 mg Intravenous BID  . heparin  5,000 Units Subcutaneous Q8H  . losartan  25 mg Oral Daily  . sodium chloride flush  3 mL Intravenous Q12H  . spironolactone  25 mg  Oral Daily    Infusions: . sodium chloride      PRN Medications: sodium chloride, acetaminophen, ondansetron (ZOFRAN) IV, sodium chloride flush    Patient Profile  Fernando Williams a 58 y.o.malewith hx of tobacco abuse, SOB, obesity and new onset systolic HF.    Assessment/Plan  1. Acute systolic HF with biventricular dysfunction - Echo 07/16/18 EF 15-20%Restricitve filling pattern RVEF mild to moderately depressed There was moderate pericardial effusion  - Agree that this is likely NICM (viral vs OSA) but with CRFs will need R/L cath when further diuresed to exclude critical CAD Volume status improving but still volume overloaded. Continue IV lasix.  - Continue spiro to 25.  - Continue losartan 25 for now. Hopefully can  titrate or switch to [facility] after cath - Add digoxin 0.125 - No b-blocker yet  - Consider cMRI - Consult cardiac rehab.  - R/L cath today  2. Polycythemia - suspect severe OSA - will need PSG as outpatient  3. Hypokalemia - Resolved.   4. AKI - creatinine 0.9 -> 1.3-> 1.27  - likely due to low output ATN.  - watch closely with diuresis. Does not seem overtly low output currently.   5. Obesity - needs dietary modification - check lipids   6. DMII HGb A1C 8.6. Says his PCP wanted him check CBG but to wait on metformin.  - Consult diabetes coordinator.  7. NSVT-   - May need Life Vest at discharge. NSVT last night.  - K 3.9. Check MAg.  - Keep K > 4.0. Mg > 2.0    Length of Stay: 2  Glori Bickers, MD  07/21/2018, 9:58 AM  Advanced Heart Failure Team Pager [phone] (M-F; 7a - 4p)  Please contact [facility] for night-coverage after hours (4p -7a ) and weekends on [email or web address]   Patient seen and examined with the above-signed Advanced Practice Provider and/or Housestaff. I personally reviewed laboratory data, imaging studies and relevant notes. I independently examined the patient and formulated the important aspects of the plan. I have edited the note to reflect any of my changes or salient points. I have personally discussed the plan with the patient and/or family.  Volume status improving but still has some volume on board. Diuresing well. Had some NSVT overnight. Continue IV diuresis. Plan R/L cath later today.   Having some NSVT. Supp lytes. May need LifeVest prior to d/c. If cath without CAD with need cMRI. Titrate HF meds.   HgbA1c 8.6/ will need metformin and SGLT2i.  Glori Bickers, MD  10:04 AM

## 2018-07-21 NOTE — Progress Notes (Addendum)
Advanced Heart Failure Rounding Note  PCP-Cardiologist: No primary care provider on file.   Subjective:   Yesterday diuresed with IV lasix. Negative> 2 liters.    Denies SOB. Denies chest pain.    Objective:   Weight Range: 95.2 kg Body mass index is 30.12 kg/m.   Vital Signs:   Temp:  [97.9 F (36.6 C)-98.3 F (36.8 C)] 98.2 F (36.8 C) (07/06 0704) Pulse Rate:  [84-95] 84 (07/06 0704) Resp:  [18-20] 18 (07/06 0704) BP: (102-117)/(78-96) 102/78 (07/06 0704) SpO2:  [94 %-96 %] 95 % (07/06 0704) Weight:  [95.2 kg] 95.2 kg (07/06 0704) Last BM Date: 07/20/18  Weight change: Filed Weights   07/19/18 0556 07/20/18 0500 07/21/18 0704  Weight: 102.2 kg 99.4 kg 95.2 kg    Intake/Output:   Intake/Output Summary (Last 24 hours) at 07/21/2018 0847 Last data filed at 07/21/2018 0705 Gross per 24 hour  Intake 720 ml  Output 3850 ml  Net -3130 ml      Physical Exam    General:  Well appearing. No resp difficulty HEENT: Normal Neck: Supple. JVP 11-12 . Carotids 2+ bilat; no bruits. No lymphadenopathy or thyromegaly appreciated. Cor: PMI nondisplaced. Regular rate & rhythm. No rubs, gallops or murmurs. Lungs: Clear Abdomen: Soft, nontender, nondistended. No hepatosplenomegaly. No bruits or masses. Good bowel sounds. Extremities: No cyanosis, clubbing, rash,  R and LLE 2+ edema Neuro: Alert & orientedx3, cranial nerves grossly intact. moves all 4 extremities w/o difficulty. Affect pleasant   Telemetry  NSR 90s PVCs and NSVT personally reviewed.   EKG    N/A   Labs    CBC Recent Labs    07/18/18 1513  WBC 9.7  HGB 17.6*  HCT 54.3*  MCV 95.6  PLT 993   Basic Metabolic Panel Recent Labs    07/20/18 0432 07/21/18 0557  NA 138 141  K 3.9 3.9  CL 96* 97*  CO2 29 31  GLUCOSE 120* 126*  BUN 26* 25*  CREATININE 1.31* 1.27*  CALCIUM 8.9 9.0   Liver Function Tests No results for input(s): AST, ALT, ALKPHOS, BILITOT, PROT, ALBUMIN in the last 72  hours. No results for input(s): LIPASE, AMYLASE in the last 72 hours. Cardiac Enzymes No results for input(s): CKTOTAL, CKMB, CKMBINDEX, TROPONINI in the last 72 hours.  BNP: BNP (last 3 results) No results for input(s): BNP in the last 8760 hours.  ProBNP (last 3 results) No results for input(s): PROBNP in the last 8760 hours.   D-Dimer No results for input(s): DDIMER in the last 72 hours. Hemoglobin A1C Recent Labs    07/21/18 0557  HGBA1C 8.6*   Fasting Lipid Panel Recent Labs    07/21/18 0557  CHOL 146  HDL 31*  LDLCALC 99  TRIG 81  CHOLHDL 4.7   Thyroid Function Tests No results for input(s): TSH, T4TOTAL, T3FREE, THYROIDAB in the last 72 hours.  Invalid input(s): FREET3  Other results:   Imaging     No results found.   Medications:     Scheduled Medications: . aspirin EC  81 mg Oral Daily  . furosemide  80 mg Intravenous BID  . heparin  5,000 Units Subcutaneous Q8H  . losartan  25 mg Oral Daily  . sodium chloride flush  3 mL Intravenous Q12H  . spironolactone  25 mg Oral Daily     Infusions: . sodium chloride       PRN Medications:  sodium chloride, acetaminophen, ondansetron (ZOFRAN) IV, sodium chloride flush  Patient Profile  Fernando Williams a 58 y.o.malewith hx of tobacco abuse, SOB, obesity and new onset systolic HF.    Assessment/Plan  1. Acute systolic HF with biventricular dysfunction - Echo 07/16/18 EF 15-20%Restricitve filling pattern RVEF mild to moderately depressed There was moderate pericardial effusion  - Agree that this is likely NICM (viral vs OSA) but with CRFs will need R/L cath when further diuresed to exclude critical CAD Volume status improving but still volume overloaded. Continue IV lasix.  - Continue spiro to 25.  - Continue losartan 25 for now. Hopefully can titrate or switch to [facility] after cath - Add digoxin 0.125 - No b-blocker yet  - Consider cMRI - Consult cardiac rehab.  Cath likely  tomorrow.   2. Polycythemia - suspect severe OSA - will need PSG as outpatient  3. Hypokalemia - Resolved.   4. AKI - creatinine 0.9 -> 1.3-> 1.27  - likely due to low output ATN.  - watch closely with diuresis. Does not seem overtly low output currently.   5. Obesity - needs dietary modification - check lipids   6. DMII HGb A1C 8.6. Says his PCP wanted him check CBG but to wait on metformin.  - Consult diabetes coordinator.  7. NSVT-   May need Life Vest at discharge. NSVT last night.  K 3.69. Check MAg.     Length of Stay: 2  Darrick Grinder, NP  07/21/2018, 8:47 AM  Advanced Heart Failure Team Pager [phone] (M-F; 7a - 4p)  Please contact [facility] for night-coverage after hours (4p -7a ) and weekends on [email or web address]   Agree. See my note from 10:04am .For cath today.   Glori Bickers, MD  3:08 PM

## 2018-07-21 NOTE — Progress Notes (Signed)
CARDIAC REHAB PHASE I   PRE:  Rate/Rhythm: 98 SR    BP: sitting 113/89    SaO2: 97 RA  MODE:  Ambulation: 430 ft   POST:  Rate/Rhythm: 113 ST    BP: sitting 111/74     SaO2: 97 RA  Pt able to walk hall, slight imbalance due to fluid. Sts he still feels slightly SOB. Discussed HF booklet in depth. Pt voiced understanding but also sts he can't give up all of his "vices". He did quit smoking cigars in March and wants to stay quit. Will continue to follow and educate. [place], ACSM 07/21/2018 12:02 PM

## 2018-07-21 NOTE — Progress Notes (Signed)
Tried to pull 2cc air from TR band and pt started to bleed. TR band still has 11cc air. Will monitor accordingly.

## 2018-07-22 ENCOUNTER — Inpatient Hospital Stay (HOSPITAL_COMMUNITY): Payer: 59

## 2018-07-22 ENCOUNTER — Encounter (HOSPITAL_COMMUNITY): Payer: Self-pay | Admitting: Internal Medicine

## 2018-07-22 DIAGNOSIS — I5021 Acute systolic (congestive) heart failure: Secondary | ICD-10-CM

## 2018-07-22 LAB — BASIC METABOLIC PANEL
Anion gap: 10 (ref 5–15)
BUN: 24 mg/dL — ABNORMAL HIGH (ref 6–20)
CO2: 31 mmol/L (ref 22–32)
Calcium: 9 mg/dL (ref 8.9–10.3)
Chloride: 97 mmol/L — ABNORMAL LOW (ref 98–111)
Creatinine, Ser: 1.26 mg/dL — ABNORMAL HIGH (ref 0.61–1.24)
GFR calc Af Amer: >60 mL/min (ref >60)
GFR calc non Af Amer: >60 mL/min (ref >60)
Glucose, Bld: 117 mg/dL — ABNORMAL HIGH (ref 70–99)
Potassium: 3.9 mmol/L (ref 3.5–5.1)
Sodium: 138 mmol/L (ref 135–145)

## 2018-07-22 LAB — MAGNESIUM: Magnesium: 2.1 mg/dL (ref 1.7–2.4)

## 2018-07-22 MED ORDER — LOSARTAN POTASSIUM 25 MG PO TABS: 25.0000 mg | ORAL_TABLET | Freq: Every day | ORAL | Status: DC

## 2018-07-22 MED ORDER — SODIUM CHLORIDE 0.9 % IV BOLUS
500.0000 mL | Freq: Once | INTRAVENOUS | Status: AC
  Administered 2018-07-22: 500 mL via INTRAVENOUS

## 2018-07-22 MED ORDER — ATORVASTATIN CALCIUM 10 MG PO TABS
20.0000 mg | ORAL_TABLET | Freq: Every day | ORAL | Status: DC
  Administered 2018-07-22: 20 mg via ORAL
  Filled 2018-07-22: qty 2

## 2018-07-22 MED ORDER — GADOBUTROL 1 MMOL/ML IV SOLN
10.0000 mL | Freq: Once | INTRAVENOUS | Status: AC | PRN
  Administered 2018-07-22: 10 mL via INTRAVENOUS

## 2018-07-22 NOTE — Progress Notes (Addendum)
Advanced Heart Failure Rounding Note  PCP-Cardiologist: No primary care provider on file.   Subjective:    Yesterday he had RHC that showed  mild non obs CAD, severe NICM, elevated filling pressures.  IV lasix was continued . Negative 3 liters. Weight down another 7 pounds.   Denies SOB, dizziness. SBP in 70s. Denies dizziness.    Objective:   Weight Range: 91.9 kg Body mass index is 29.07 kg/m.   Vital Signs:   Temp:  [98.3 F (36.8 C)-99 F (37.2 C)] 99 F (37.2 C) (07/07 0411) Pulse Rate:  [0-100] 80 (07/07 0815) Resp:  [0-20] 18 (07/07 0411) BP: (78-119)/(56-84) 78/56 (07/07 0820) SpO2:  [0 %-100 %] 92 % (07/07 0411) Weight:  [91.9 kg] 91.9 kg (07/07 0411) Last BM Date: 07/21/18  Weight change: Filed Weights   07/20/18 0500 07/21/18 0704 07/22/18 0411  Weight: 99.4 kg 95.2 kg 91.9 kg    Intake/Output:   Intake/Output Summary (Last 24 hours) at 07/22/2018 0859 Last data filed at 07/22/2018 0800 Gross per 24 hour  Intake 1480 ml  Output 4550 ml  Net -3070 ml      Physical Exam    General:  Well appearing. No resp difficulty HEENT: normal Neck: supple. no JVD. Carotids 2+ bilat; no bruits. No lymphadenopathy or thryomegaly appreciated. Cor: PMI nondisplaced. Regular rate & rhythm. No rubs, gallops or murmurs. Lungs: clear Abdomen: soft, nontender, nondistended. No hepatosplenomegaly. No bruits or masses. Good bowel sounds. Extremities: no cyanosis, clubbing, rash, tr edema Neuro: alert & orientedx3, cranial nerves grossly intact. moves all 4 extremities w/o difficulty. Affect pleasant    Telemetry   NSR NSVT brief 80s personally reviewed.   EKG    N/A   Labs    CBC Recent Labs    07/21/18 1442 07/21/18 1443  HGB 16.7 14.3  HCT 49.0 00.8   Basic Metabolic Panel Recent Labs    07/21/18 0557  07/21/18 1443 07/22/18 0426  NA 141   < > 151* 138  K 3.9   < > 2.5* 3.9  CL 97*  --   --  97*  CO2 31  --   --  31  GLUCOSE 126*  --   --   117*  BUN 25*  --   --  24*  CREATININE 1.27*  --   --  1.26*  CALCIUM 9.0  --   --  9.0  MG 2.0  --   --  2.1   < > = values in this interval not displayed.   Liver Function Tests No results for input(s): AST, ALT, ALKPHOS, BILITOT, PROT, ALBUMIN in the last 72 hours. No results for input(s): LIPASE, AMYLASE in the last 72 hours. Cardiac Enzymes No results for input(s): CKTOTAL, CKMB, CKMBINDEX, TROPONINI in the last 72 hours.  BNP: BNP (last 3 results) No results for input(s): BNP in the last 8760 hours.  ProBNP (last 3 results) No results for input(s): PROBNP in the last 8760 hours.   D-Dimer No results for input(s): DDIMER in the last 72 hours. Hemoglobin A1C Recent Labs    07/21/18 0557  HGBA1C 8.6*   Fasting Lipid Panel Recent Labs    07/21/18 0557  CHOL 146  HDL 31*  LDLCALC 99  TRIG 81  CHOLHDL 4.7   Thyroid Function Tests No results for input(s): TSH, T4TOTAL, T3FREE, THYROIDAB in the last 72 hours.  Invalid input(s): FREET3  Other results:   Imaging    No results found.  Medications:     Scheduled Medications: . aspirin EC  81 mg Oral Daily  . digoxin  0.125 mg Oral Daily  . enoxaparin (LOVENOX) injection  40 mg Subcutaneous Q24H  . furosemide  80 mg Intravenous BID  . losartan  25 mg Oral Daily  . sodium chloride flush  3 mL Intravenous Q12H  . sodium chloride flush  3 mL Intravenous Q12H  . spironolactone  25 mg Oral Daily    Infusions: . sodium chloride    . sodium chloride      PRN Medications: sodium chloride, sodium chloride, acetaminophen, ondansetron (ZOFRAN) IV, sodium chloride flush, sodium chloride flush    Patient Profile  Fernando Williams a 58 y.o.malewith hx of tobacco abuse, SOB, obesity and new onset systolic HF.    Assessment/Plan  1. Acute systolic HF with biventricular dysfunction - Echo 07/16/18 EF 15-20%Restricitve filling pattern RVEF mild to moderately depressed There was moderate pericardial  effusion  - Agree that this is likely NICM (viral vs OSA) but with CRFs.  RHC/LHC mild non obs CAD, severe NICM, elevated filling pressures.  - Volume status much improved. Overall weight down 26 pounds. Hold IV lasix. Give 500 NS now. SBP in 70s.  - Continue spiro to 25 ng daily.  - Change losartan to bedtime.  - Continue digoxin 0.125 - No b-blocker yet  - CMRI today.  - Consult cardiac rehab.   2. Polycythemia - suspect severe OSA - will need PSG as outpatient  3. Hypokalemia - Resolved.   4. AKI - creatinine 0.9 -> 1.3-> 1.27 ->1.26  - likely due to low output ATN.  - watch closely with diuresis. Does not seem overtly low output currently.   5. Obesity - needs dietary modification   6. DMII HGb A1C 8.6. Says his PCP wanted him check CBG but to wait on metformin.  - Diabetes coordinator consulted.  - He will need follow up with his PCP.   7. NSVT-   - May need Life Vest at discharge. NSVT last 2 nights.  - K 3.9. Mag 2.1 .  - Keep K > 4.0. Mg > 2.0  8. CAD Nonobstructive CAD . Mid LAD 40% stenoses, Prox RCA, Mid RCA 20%, Prox CX-Min CX 30 %.  Add Lipitor.   HF follow up set up in the HF clinic.     Length of Stay: 3  Amy Clegg, NP  07/22/2018, 8:59 AM  Advanced Heart Failure Team Pager [phone] (M-F; 7a - 4p)  Please contact [facility] for night-coverage after hours (4p -7a ) and weekends on [email or web address]  Patient seen and examined with the above-signed Advanced Practice Provider and/or Housestaff. I personally reviewed laboratory data, imaging studies and relevant notes. I independently examined the patient and formulated the important aspects of the plan. I have edited the note to reflect any of my changes or salient points. I have personally discussed the plan with the patient and/or family.  Cath results reviewed with him. NICM. Volume status now dry. BP low. Will give back 500cc NS. Adjust HF meds. Still with occasional NSVT. Supp K and MG. Will place  Life Vest piro to d/c. cMRI today.   Glori Bickers, MD  9:34 AM

## 2018-07-22 NOTE — Progress Notes (Signed)
Pt. With run of 5 and 7 beat runs of VTach. Pt. Resting in bed. Asymptomatic. No distress or discomfort noted. Pt. Denies pain. RN will continue to monitor pt. For changes. Lynda Capistran, Katherine Roan

## 2018-07-22 NOTE — Plan of Care (Signed)
  Problem: Education: Goal: Knowledge of General Education information will improve Description: Including pain rating scale, medication(s)/side effects and non-pharmacologic comfort measures Outcome: Progressing   Problem: Activity: Goal: Capacity to carry out activities will improve Outcome: Progressing   Problem: Cardiac: Goal: Ability to achieve and maintain adequate cardiopulmonary perfusion will improve Outcome: Progressing   

## 2018-07-22 NOTE — Progress Notes (Signed)
Ts. SBP is on the 80's, patient asymptomatic. Will monitor accordingly.

## 2018-07-22 NOTE — Progress Notes (Signed)
Inpatient Diabetes Program Recommendations  AACE/ADA: New Consensus Statement on Inpatient Glycemic Control (2015)  Target Ranges:  Prepandial:   less than 140 mg/dL      Peak postprandial:   less than 180 mg/dL (1-2 hours)      Critically ill patients:  140 - 180 mg/dL   Lab Results  Component Value Date   HGBA1C 8.6 (H) 07/21/2018    Admit with: Acute systolic HF with biventricular dysfunction  New Diagnosis of Diabetes as well  Current Orders: None    MD- Patient may benefit form oral medication at time of discharge.  Given CHF, may consider Jardiance for home 10 mg Daily to start  Please also place orders for Novolog Sensitive Correction Scale/ SSI (0-9 units) TID AC + HS while patient in hospital  Thanks, Bronson Curb, MSN, RNC-OB Diabetes Coordinator [phone] (8a-5p)

## 2018-07-22 NOTE — Plan of Care (Signed)
  Problem: Activity: Goal: Capacity to carry out activities will improve Outcome: Progressing   Problem: Cardiac: Goal: Ability to achieve and maintain adequate cardiopulmonary perfusion will improve Outcome: Progressing   

## 2018-07-22 NOTE — Plan of Care (Signed)
Nutrition Education Note  RD consulted for nutrition education regarding CHF and diabetes.   Lab Results  Component Value Date   HGBA1C 8.6 (H) 07/21/2018   PTA DM medications are none.   Labs reviewed. No CBGS ordered (inpatient orders for glycemic control are none).   Spoke with pt at bedside, who was pleasant and in good spirits today. He reports improved appetite since diuresis. PTA pt reports that he typically consumes 1-2 meals per day, consisting of a pack of nabs and a grilled chicken sandwich. Pt report both DM and CHF diagnoses are new to him. Pt was able to teachback to this RD basic diet education on DM and CHF from other providers that have visited him previously. Focus of education was on self-management and low carbohydrate, high protein, low sodium convenience foods.   RD will also refer pt to [place] [place]) as pt lives in [place].    RD provided "Heart Healthy, Consistent Carbohydrate Nutrition Therapy" handout from the Academy of [employer] and Dietetics. Reviewed patient's dietary recall. Provided examples on ways to decrease sodium intake in diet. Discouraged intake of processed foods and use of salt shaker. Encouraged fresh fruits and vegetables as well as whole grain sources of carbohydrates to maximize fiber intake.   RD discussed why it is important for patient to adhere to diet recommendations, and emphasized the role of fluids, foods to avoid, and importance of weighing self daily.   Discussed different food groups and their effects on blood sugar, emphasizing carbohydrate-containing foods. Provided list of carbohydrates and recommended serving sizes of common foods.  Discussed importance of controlled and consistent carbohydrate intake throughout the day. Provided examples of ways to balance meals/snacks and encouraged intake of high-fiber, whole grain complex carbohydrates. Teach back method  used.  Expect fair to good compliance.  Body mass index is 29.07 kg/m. Pt meets criteria for underweight based on current BMI.  Current diet order is Heart Healthy, patient is consuming approximately 100% of meals at this time. Labs and medications reviewed. No further nutrition interventions warranted at this time. RD contact information provided. If additional nutrition issues arise, please re-consult RD.   Denice Cardon A. Jimmye Norman, RD, LDN, [place] Registered Dietitian II Certified Diabetes Care and Education Specialist Pager: [phone] After hours Pager: [phone]

## 2018-07-22 NOTE — Discharge Instructions (Signed)
Fingerstick glucose (sugar) goals for home: Before meals: 80-130 mg/dl 2-Hours after meals: less than 180 mg/dl Hemoglobin A1c goal: 7% or less  Heart Healthy, Consistent Carbohydrate Nutrition Therapy   A heart-healthy and consistent carbohydrate diet is recommended to manage heart disease and diabetes. To follow a heart-healthy and consistent carbohydrate diet,  Eat a balanced diet with whole grains, fruits and vegetables, and lean protein sources.   Choose heart-healthy unsaturated fats. Limit saturated fats, trans fats, and cholesterol intake. Eat more plant-based or vegetarian meals using beans and soy foods for protein.   Eat whole, unprocessed foods to limit the amount of sodium (salt) you eat.   Choose a consistent amount of carbohydrate at each meal and snack. Limit refined carbohydrates especially sugar, sweets and sugar-sweetened beverages.   If you drink alcohol, do so in moderation: one serving per day (women) and two servings per day (men). o One serving is equivalent to 12 ounces beer, 5 ounces wine, or 1.5 ounces distilled spirits  Tips Tips for Choosing Heart-Healthy Fats Choose lean protein and low-fat dairy foods to reduce saturated fat intake.  Saturated fat is usually found in animal-based protein and is associated with certain health risks. Saturated fat is the biggest contributor to raise low-density lipoprotein (LDL) cholesterol levels. Research shows that limiting saturated fat lowers unhealthy cholesterol levels. Eat no more than 7% of your total calories each day from saturated fat. Ask your RDN to help you determine how much saturated fat is right for you.  There are many foods that do not contain large amounts of saturated fats. Swapping these foods to replace foods high in saturated fats will help you limit the saturated fat you eat and improve your cholesterol levels. You can also try eating more plant-based or vegetarian meals. Instead of Try:  Whole  milk, cheese, yogurt, and ice cream 1% or skim milk, low-fat cheese, non-fat yogurt, and low-fat ice cream  Fatty, marbled beef and pork Lean beef, pork, or venison  Poultry with skin Poultry without skin  Butter, stick margarine Reduced-fat, whipped, or liquid spreads  Coconut oil, palm oil Liquid vegetable oils: corn, canola, olive, soybean and safflower oils   Avoid foods that contain trans fats.  Trans fats increase levels of LDL-cholesterol. Hydrogenated fat in processed foods is the main source of trans fats in foods.   Trans fats can be found in stick margarine, shortening, processed sweets, baked goods, some fried foods, and packaged foods made with hydrogenated oils. Avoid foods with partially hydrogenated oil on the ingredient list such as: cookies, pastries, baked goods, biscuits, crackers, microwave popcorn, and frozen dinners. Choose foods with heart healthy fats.  Polyunsaturated and monounsaturated fat are unsaturated fats that may help lower your blood cholesterol level when used in place of saturated fat in your diet.  Ask your RDN about taking a dietary supplement with plant sterols and stanols to help lower your cholesterol level.  Research shows that substituting saturated fats with unsaturated fats is beneficial to cholesterol levels. Try these easy swaps: Instead of Try:  Butter, stick margarine, or solid shortening Reduced-fat, whipped, or liquid spreads  Beef, pork, or poultry with skin Fish and seafood  Chips, crackers, snack foods Raw or unsalted nuts and seeds or nut butters Hummus with vegetables Avocado on toast  Coconut oil, palm oil Liquid vegetable oils: corn, canola, olive, soybean and safflower oils  Limit the amount of cholesterol you eat to less than 200 milligrams per day.  Cholesterol is a substance  carried through the bloodstream via lipoproteins, which are known as transporters of fat. Some body functions need cholesterol to work properly, but  too much cholesterol in the bloodstream can damage arteries and build up blood vessel linings (which can lead to heart attack and stroke). You should eat less than 200 milligrams cholesterol per day.  People respond differently to eating cholesterol. There is no test available right now that can figure out which people will respond more to dietary cholesterol and which will respond less. For individuals with high intake of dietary cholesterol, different types of increase (none, small, moderate, large) in LDL-cholesterol levels are all possible.   Food sources of cholesterol include egg yolks and organ meats such as liver, gizzards. Limit egg yolks to two to four per week and avoid organ meats like liver and gizzards to control cholesterol intake. Tips for Choosing Heart-Healthy Carbohydrates Consume a consistent amount of carbohydrate  It is important to eat foods with carbohydrates in moderation because they impact your blood glucose level. Carbohydrates can be found in many foods such as:  Grains (breads, crackers, rice, pasta, and cereals)   Starchy Vegetables (potatoes, corn, and peas)   Beans and legumes   Milk, soy milk, and yogurt   Fruit and fruit juice   Sweets (cakes, cookies, ice cream, jam and jelly)  Your RDN will help you set a goal for how many carbohydrate servings to eat at your meals and snacks. For many adults, eating 3 to 5 servings of carbohydrate foods at each meal and 1 or 2 carbohydrate servings for each snack works well.   Check your blood glucose level regularly. It can tell you if you need to adjust when you eat carbohydrates.  Choose foods rich in viscous (soluble) fiber  Viscous, or soluble, is found in the walls of plant cells. Viscous fiber is found only in plant-based foods. Eating foods with fiber helps to lower your unhealthy cholesterol and keep your blood glucose in range   Rich sources of viscous fiber include vegetables (asparagus, Brussels sprouts,  sweet potatoes, turnips) fruit (apricots, mangoes, oranges), legumes, and whole grains (barley, oats, and oat bran).   As you increase your fiber intake gradually, also increase the amount of water you drink. This will help prevent constipation.   If you have difficulty achieving this goal, ask your RDN about fiber laxatives. Choose fiber supplements made with viscous fibers such as psyllium seed husks or methylcellulose to help lower unhealthy cholesterol.   Limit refined carbohydrates   There are three types of carbohydrates: starches, sugar, and fiber. Some carbohydrates occur naturally in food, like the starches in rice or corn or the sugars in fruits and milk. Refined carbohydrates--foods with high amounts of simple sugars--can raise triglyceride levels. High triglyceride levels are associated with coronary heart disease.  Some examples of refined carbohydrate foods are table sugar, sweets, and beverages sweetened with added sugar. Tips for Reducing Sodium (Salt) Although sodium is important for your body to function, too much sodium can be harmful for people with high blood pressure. As sodium and fluid buildup in your tissues and bloodstream, your blood pressure increases. High blood pressure may cause damage to other organs and increase your risk for a stroke. Even if you take a pill for blood pressure or a water pill (diuretic) to remove fluid, it is still important to have less salt in your diet. Ask your doctor and RDN what amount of sodium is right for you.  Avoid processed foods.  Eat more fresh foods.   Fresh fruits and vegetables are naturally low in sodium, as well as frozen vegetables and fruits that have no added juices or sauces.   Fresh meats are lower in sodium than processed meats, such as bacon, sausage, and hotdogs. Read the nutrition label or ask your butcher to help you find a fresh meat that is low in sodium.  Eat less salt--at the table and when cooking.   A single  teaspoon of table salt has 2,300 mg of sodium.   Leave the salt out of recipes for pasta, casseroles, and soups.   Ask your RDN how to cook your favorite recipes without sodium  Be a smart shopper.   Look for food packages that say salt-free or sodium-free. These items contain less than 5 milligrams of sodium per serving.   Very low-sodium products contain less than 35 milligrams of sodium per serving.   Low-sodium products contain less than 140 milligrams of sodium per serving.   Beware for Unsalted or No Added Salt products. These items may still be high in sodium. Check the nutrition label.  Add flavors to your food without adding sodium.   Try lemon juice, lime juice, fruit juice or vinegar.   Dry or fresh herbs add flavor. Try basil, bay leaf, dill, rosemary, parsley, sage, dry mustard, nutmeg, thyme, and paprika.   Pepper, red pepper flakes, and cayenne pepper can add spice t your meals without adding sodium. Hot sauce contains sodium, but if you use just a drop or two, it will not add up to much.   Buy a sodium-free seasoning blend or make your own at home. Additional Lifestyle Tips Achieve and maintain a healthy weight.  Talk with your RDN or your doctor about what is a healthy weight for you.  Set goals to reach and maintain that weight.   To lose weight, reduce your calorie intake along with increasing your physical activity. A weight loss of 10 to 15 pounds could reduce LDL-cholesterol by 5 milligrams per deciliter. Participate in physical activity.  Talk with your health care team to find out what types of physical activity are best for you. Set a plan to get about 30 minutes of exercise on most days.  Foods Recommended Food Group Foods Recommended  Grains Whole grain breads and cereals, including whole wheat, barley, rye, buckwheat, corn, teff, quinoa, millet, amaranth, brown or wild rice, sorghum, and oats Pasta, especially whole wheat or other whole  grain types  [employer], quinoa or wild rice Whole grain crackers, bread, rolls, pitas Home-made bread with reduced-sodium baking soda  Protein Foods Lean cuts of beef and pork (loin, leg, round, extra lean hamburger)  Skinless Cytogeneticist and other wild game Dried beans and peas Nuts and nut butters Meat alternatives made with soy or textured vegetable protein  Egg whites or egg substitute Cold cuts made with lean meat or soy protein  Dairy Nonfat (skim), low-fat, or 1%-fat milk  Nonfat or low-fat yogurt or cottage cheese Fat-free and low-fat cheese  Vegetables Fresh, frozen, or canned vegetables without added fat or salt   Fruits Fresh, frozen, canned, or dried fruit   Oils Unsaturated oils (corn, olive, peanut, soy, sunflower, canola)  Soft or liquid margarines and vegetable oil spreads  Salad dressings Seeds and nuts  Avocado   Foods Not Recommended Food Group Foods Not Recommended  Grains Breads or crackers topped with salt Cereals (hot or cold) with more than 300 mg sodium per  serving Biscuits, cornbread, and other quick breads prepared with baking soda Bread crumbs or stuffing mix from a store High-fat bakery products, such as doughnuts, biscuits, croissants, danish pastries, pies, cookies Instant cooking foods to which you add hot water and stir--potatoes, noodles, rice, etc. Packaged starchy foods--seasoned noodle or rice dishes, stuffing mix, macaroni and cheese dinner Snacks made with partially hydrogenated oils, including chips, cheese puffs, snack mixes, regular crackers, butter-flavored popcorn  Protein Foods Higher-fat cuts of meats (ribs, t-bone steak, regular hamburger) Bacon, sausage, or hot dogs Cold cuts, such as salami or bologna, deli meats, cured meats, corned beef Organ meats (liver, brains, gizzards, sweetbreads) Poultry with skin Fried or smoked meat, poultry, and fish Whole eggs and egg yolks (more than 2-4 per week) Salted legumes, nuts,  seeds, or nut/seed butters Meat alternatives with high levels of sodium (>300 mg per serving) or saturated fat (>5 g per serving)  Dairy Whole milk,?2% fat milk, buttermilk Whole milk yogurt or ice cream Cream Half-&-half Cream cheese Sour cream Cheese  Vegetables Canned or frozen vegetables with salt, fresh vegetables prepared with salt, butter, cheese, or cream sauce Fried vegetables Pickled vegetables such as olives, pickles, or sauerkraut  Fruits Fried fruits Fruits served with butter or cream  Oils Butter, stick margarine, shortening Partially hydrogenated oils or trans fats Tropical oils (coconut, palm, palm kernel oils)  Other Candy, sugar sweetened soft drinks and desserts Salt, sea salt, garlic salt, and seasoning mixes containing salt Bouillon cubes Ketchup, barbecue sauce, Worcestershire sauce, soy sauce, teriyaki sauce Miso Salsa Pickles, olives, relish   Heart Healthy Consistent Carbohydrate Vegetarian (Lacto-Ovo) Sample 1-Day Menu  Breakfast 1 cup oatmeal, cooked (2 carbohydrate servings)   cup blueberries (1 carbohydrate serving)  11 almonds, without salt  1 cup 1% milk (1 carbohydrate serving)  1 cup coffee  Morning Snack 1 cup fat-free plain yogurt (1 carbohydrate serving)  Lunch 1 whole wheat bun (1 carbohydrate servings)  1 black bean burger (1 carbohydrate servings)  1 slice cheddar cheese, low sodium  2 slices tomatoes  2 leaves lettuce  1 teaspoon mustard  1 small pear (1 carbohydrate servings)  1 cup green tea, unsweetened  Afternoon Snack 1/3 cup trail mix with nuts, seeds, and raisins, without salt (1 carbohydrate servinga)  Evening Meal  cup meatless chicken  2/3 cup brown rice, cooked (2 carbohydrate servings)  1 cup broccoli, cooked (2/3 carbohydrate serving)   cup carrots, cooked (1/3 carbohydrate serving)  2 teaspoons olive oil  1 teaspoon balsamic vinegar  1 whole wheat dinner roll (1 carbohydrate serving)  1 teaspoon margarine,  soft, tub  1 cup 1% milk (1 carbohydrate serving)  Evening Snack 1 extra small banana (1 carbohydrate serving)  1 tablespoon peanut butter   Heart Healthy Consistent Carbohydrate Vegan Sample 1-Day Menu  Breakfast 1 cup oatmeal, cooked (2 carbohydrate servings)   cup blueberries (1 carbohydrate serving)  11 almonds, without salt  1 cup soymilk fortified with calcium, vitamin B12, and vitamin D  1 cup coffee  Morning Snack 6 ounces soy yogurt (1 carbohydrate servings)  Lunch 1 whole wheat bun(1 carbohydrate servings)  1 black bean burger (1 carbohydrate serving)  2 slices tomatoes  2 leaves lettuce  1 teaspoon mustard  1 small pear (1 carbohydrate servings)  1 cup green tea, unsweetened  Afternoon Snack 1/3 cup trail mix with nuts, seeds, and raisins, without salt (1 carbohydrate servings)  Evening Meal  cup meatless chicken  2/3 cup brown rice, cooked (2 carbohydrate  servings)  1 cup broccoli, cooked (2/3 carbohydrate serving)   cup carrots, cooked (1/3 carbohydrate serving)  2 teaspoons olive oil  1 teaspoon balsamic vinegar  1 whole wheat dinner roll (1 carbohydrate serving)  1 teaspoon margarine, soft, tub  1 cup soymilk fortified with calcium, vitamin B12, and vitamin D  Evening Snack 1 extra small banana (1 carbohydrate serving)  1 tablespoon peanut butter    Heart Healthy Consistent Carbohydrate Sample 1-Day Menu  Breakfast 1 cup cooked oatmeal (2 carbohydrate servings)  3/4 cup blueberries (1 carbohydrate serving)  1 ounce almonds  1 cup skim milk (1 carbohydrate serving)  1 cup coffee  Morning Snack 1 cup sugar-free nonfat yogurt (1 carbohydrate serving)  Lunch 2 slices whole-wheat bread (2 carbohydrate servings)  2 ounces lean [place] breast  1 ounce low-fat Swiss cheese  1 teaspoon mustard  1 slice tomato  1 lettuce leaf  1 small pear (1 carbohydrate serving)  1 cup skim milk (1 carbohydrate serving)  Afternoon Snack 1 ounce trail mix with unsalted nuts,  seeds, and raisins (1 carbohydrate serving)  Evening Meal 3 ounces salmon  2/3 cup cooked brown rice (2 carbohydrate servings)  1 teaspoon soft margarine  1 cup cooked broccoli with 1/2 cup cooked carrots (1 carbohydrate serving  Carrots, cooked, boiled, drained, without salt  1 cup lettuce  1 teaspoon olive oil with vinegar for dressing  1 small whole grain roll (1 carbohydrate serving)  1 teaspoon soft margarine  1 cup unsweetened tea  Evening Snack 1 extra-small banana (1 carbohydrate serving)  Copyright 2020  [employer]. All rights reserved.

## 2018-07-22 NOTE — Progress Notes (Signed)
Pt receiving fluids. Not in the mood to walk right now. Reviewed HF management and discussed walking at home. Gave pt Lifevest video to watch. Pt not interested in CRPII. [place] 11:55 AM 07/22/2018

## 2018-07-22 NOTE — Discharge Summary (Addendum)
Advanced Heart Failure Team  Discharge Summary   Patient ID: Fernando Williams MRN: [id number], DOB/AGE: Dec 15, 1960 58 y.o. Admit date: 07/18/2018 D/C date:     07/23/2018   Primary Discharge Diagnoses:  .1. Acute systolic HF with biventricular dysfunction 2. Polycythemia 3. Hypokalemia 4. AKI 5. Obesity 6. DMII 7. NSVT 8. CAD   Hospital Course:  Fernando Williams a 58 y.o.malewith hx of tobacco abuse, SOB, obesity and new onset systolic HF.   Admitted with increased dyspnea and volume overload in the setting of acute systolic heart failure. Diuresed > 20 pounds and underwent cath that showed NICM, min obstructive cad, and mild-mod elevated filling pressures.CMRI completed and showed LVEF 17% , RV EF 28% and LGE in basal septum with possible viral myocarditis. HF medications initiated. He will continue to be followed closely in the HF clinic. See below for detailed problem list.    1. Acute systolic HF with biventricular dysfunction - Echo 07/16/18 EF 15-20%Restricitve filling pattern RVEF mild to moderately depressed There was moderate pericardial effusion - RHC/LHC mild non obs CAD, severe NICM, elevated filling pressures.  - 07/22/2018 CMRI completed and showed LVEF 17% , RV EF 28% and LGE in basal septum with possible viral myocarditis. Diuresed with IV lasix and transitioned to lasix 40 mg twice a day. Continue spiro to 25 ng daily.  - Change losartan to bedtime.  - Continue digoxin 0.125 - No b-blocker yet will start as an outpatient.  2. Polycythemia - suspect severe OSA - will need PSG as outpatient 3. Hypokalemia - Potassium follow closely.  4. AKI - creatinine 0.9 -> 1.3-> 1.27 ->1.26  - likely due to low output ATN.  5. Obesity - needs dietary modification 6. DMII HGb A1C 8.6. Says his PCP wanted him check CBG but to wait on metformin.  - Diabetes coordinator consulted.  - He will need follow up with his PCP.  7. NSVT-   -Life Vest ordered for discharge but  he refused.  8. CAD Nonobstructive CAD . Mid LAD 40% stenoses, Prox RCA, Mid RCA 20%, Prox CX-Min CX 30 %.  Started on lipitor.   Discharge Vitals: Blood pressure 93/80, pulse 86, temperature 98 F (36.7 C), temperature source Oral, resp. rate 18, height 5\' 10"  (1.778 m), weight 92.7 kg, SpO2 99 %.  Labs: Lab Results  Component Value Date   WBC 9.7 07/18/2018   HGB 14.3 07/21/2018   HCT 42.0 07/21/2018   MCV 95.6 07/18/2018   PLT 254 07/18/2018    Recent Labs  Lab 07/23/18 0500  NA 135  K 4.2  CL 98  CO2 27  BUN 19  CREATININE 1.22  CALCIUM 8.9  GLUCOSE 129*   Lab Results  Component Value Date   CHOL 146 07/21/2018   HDL 31 (L) 07/21/2018   LDLCALC 99 07/21/2018   TRIG 81 07/21/2018   BNP (last 3 results) No results for input(s): BNP in the last 8760 hours.  ProBNP (last 3 results) No results for input(s): PROBNP in the last 8760 hours.   Diagnostic Studies/Procedures  07/21/2018  RHC/LHC  Ao = 90/58 (74) LV = 89/28 RA =  9 RV = 48/12 PA = 50/21 (34) PCW = 23 Fick cardiac output/index = 4.6/2.1 PVR =3.0 WU SVR = 1143 FA sat =97% PA sat = 69%, 73%  Assessment: 1. Mild non-obstructive CAD 2. Severe NICM EF 10-15% 3. Filling pressures still elevated with moderately depressed cardiac outp  Discharge Medications   Allergies as of  07/23/2018      Reactions   Erythromycin Nausea And Vomiting   "Oxygen level went down"   Penicillins Other (See Comments)   Did it involve swelling of the face/tongue/throat, SOB, or low BP? No Did it involve sudden or severe rash/hives, skin peeling, or any reaction on the inside of your mouth or nose? Unknown Did you need to seek medical attention at a hospital or doctor's office? Unknown When did it last happen?childhood If all above answers are "NO", may proceed with cephalosporin use. "[name]" - STATES THIS OCCURS WITH ALL  "[name]"      Medication List    TAKE these medications   albuterol  108 (90 Base) MCG/ACT inhaler Commonly known as: VENTOLIN HFA Inhale 2 puffs into the lungs 3 (three) times daily. What changed:   when to take this  reasons to take this   aspirin 81 MG EC tablet Take 1 tablet (81 mg total) by mouth daily. Start taking on: July 24, 2018   atorvastatin 20 MG tablet Commonly known as: LIPITOR Take 1 tablet (20 mg total) by mouth daily at 6 PM.   digoxin 0.125 MG tablet Commonly known as: LANOXIN Take 1 tablet (0.125 mg total) by mouth daily. Start taking on: July 24, 2018   furosemide 40 MG tablet Commonly known as: LASIX Take 1 tablet (40 mg total) by mouth 2 (two) times daily. What changed:   how much to take  when to take this   losartan 25 MG tablet Commonly known as: COZAAR Take 1 tablet (25 mg total) by mouth at bedtime.   Potassium Chloride ER 20 MEQ Tbcr Take 20 tablets by mouth daily. What changed: when to take this   spironolactone 25 MG tablet Commonly known as: ALDACTONE Take 1 tablet (25 mg total) by mouth daily. Start taking on: July 24, 2018       Disposition   The patient will be discharged in stable condition to home. Discharge Instructions    (HEART FAILURE PATIENTS) Call MD:  Anytime you have any of the following symptoms: 1) 3 pound weight gain in 24 hours or 5 pounds in 1 week 2) shortness of breath, with or without a dry hacking cough 3) swelling in the hands, feet or stomach 4) if you have to sleep on extra pillows at night in order to breathe.   Complete by: As directed    Amb Referral to Nutrition and Diabetic E   Complete by: As directed    Diet - low sodium heart healthy   Complete by: As directed    Increase activity slowly   Complete by: As directed      Follow-up Information    [place]. Go on 08/12/2018.   Specialty: Cardiology Why: 10:30 AM, parking code [place] information: [address] [id number] [place] [phone]            Duration of Discharge Encounter: Greater than 35 minutes   Signed, Darrick Grinder NP-C  07/23/2018, 12:38 PM   Patient seen and examined with the above-signed Advanced Practice Provider and/or Housestaff. I personally reviewed laboratory data, imaging studies and relevant notes. I independently examined the patient and formulated the important aspects of the plan. I have edited the note to reflect any of my changes or salient points. I have personally discussed the plan with the patient and/or family.  Much improved. [place] for d/c today. Will need  close f/u in HF Clinic. Refuses LifeVest.   Glori Bickers, MD  10:45 AM

## 2018-07-23 LAB — BASIC METABOLIC PANEL
Anion gap: 10 (ref 5–15)
BUN: 19 mg/dL (ref 6–20)
CO2: 27 mmol/L (ref 22–32)
Calcium: 8.9 mg/dL (ref 8.9–10.3)
Chloride: 98 mmol/L (ref 98–111)
Creatinine, Ser: 1.22 mg/dL (ref 0.61–1.24)
GFR calc Af Amer: >60 mL/min (ref >60)
GFR calc non Af Amer: >60 mL/min (ref >60)
Glucose, Bld: 129 mg/dL — ABNORMAL HIGH (ref 70–99)
Potassium: 4.2 mmol/L (ref 3.5–5.1)
Sodium: 135 mmol/L (ref 135–145)

## 2018-07-23 MED ORDER — LOSARTAN POTASSIUM 25 MG PO TABS: 25.0000 mg | ORAL_TABLET | Freq: Every day | ORAL | 6 refills | Status: DC

## 2018-07-23 MED ORDER — DIGOXIN 125 MCG PO TABS: 0.1250 mg | ORAL_TABLET | Freq: Every day | ORAL | 6 refills | Status: DC

## 2018-07-23 MED ORDER — POTASSIUM CHLORIDE ER 20 MEQ PO TBCR: 20.0000 | EXTENDED_RELEASE_TABLET | Freq: Every day | ORAL | 6 refills | Status: DC

## 2018-07-23 MED ORDER — FUROSEMIDE 40 MG PO TABS: 40.0000 mg | ORAL_TABLET | Freq: Two times a day (BID) | ORAL | 6 refills | Status: DC

## 2018-07-23 MED ORDER — FUROSEMIDE 40 MG PO TABS: 40.0000 mg | ORAL_TABLET | Freq: Two times a day (BID) | ORAL | Status: DC

## 2018-07-23 MED ORDER — ASPIRIN 81 MG PO TBEC: 81.0000 mg | DELAYED_RELEASE_TABLET | Freq: Every day | ORAL | 6 refills | Status: AC

## 2018-07-23 MED ORDER — ATORVASTATIN CALCIUM 20 MG PO TABS: 20.0000 mg | ORAL_TABLET | Freq: Every day | ORAL | 6 refills | Status: DC

## 2018-07-23 MED ORDER — SPIRONOLACTONE 25 MG PO TABS: 25.0000 mg | ORAL_TABLET | Freq: Every day | ORAL | 6 refills | Status: DC

## 2018-07-23 NOTE — Progress Notes (Signed)
Patient is alert and orient on room air with stable low blood pressure. Rounding with Amy Clegg witnessing patient refusing Life vest due to work restriction.

## 2018-07-23 NOTE — Progress Notes (Signed)
CARDIAC REHAB PHASE I   PRE:  Rate/Rhythm: 96 SR    BP: sitting 106/73    SaO2:   MODE:  Ambulation: 650 ft   POST:  Rate/Rhythm: 105 ST    BP: sitting 127/78     SaO2:   Tolerated well, no c/o. BP is improved. Reviewed ed, pt with good understanding.  [id number]  [place], ACSM 07/23/2018 10:33 AM

## 2018-07-23 NOTE — Progress Notes (Addendum)
Advanced Heart Failure Rounding Note  PCP-Cardiologist: No primary care provider on file.   Subjective:   Yesterday IV lasix stopped and he was given 500 cc NS.   CMRI LVEF 17% RV 28%. Possible viral cardiomyopathy.    Wants to go home. Denies SOB.    Objective:   Weight Range: 92.7 kg Body mass index is 29.33 kg/m.   Vital Signs:   Temp:  [98 F (36.7 C)-98.8 F (37.1 C)] 98 F (36.7 C) (07/08 0412) Pulse Rate:  [85-99] 86 (07/08 0412) Resp:  [17-18] 18 (07/08 0412) BP: (93-107)/(73-86) 93/80 (07/08 0412) SpO2:  [97 %-99 %] 99 % (07/08 0412) Weight:  [92.7 kg] 92.7 kg (07/08 0412) Last BM Date: 07/21/18  Weight change: Filed Weights   07/21/18 0704 07/22/18 0411 07/23/18 0412  Weight: 95.2 kg 91.9 kg 92.7 kg    Intake/Output:   Intake/Output Summary (Last 24 hours) at 07/23/2018 0857 Last data filed at 07/23/2018 0300 Gross per 24 hour  Intake 240 ml  Output 1250 ml  Net -1010 ml      Physical Exam    General:  Well appearing. No resp difficulty. Sitting in the chair.  HEENT: normal Neck: supple. JVP 7-8 . Carotids 2+ bilat; no bruits. No lymphadenopathy or thryomegaly appreciated. Cor: PMI nondisplaced. Regular rate & rhythm. No rubs, gallops or murmurs. Lungs: clear Abdomen: soft, nontender, nondistended. No hepatosplenomegaly. No bruits or masses. Good bowel sounds. Extremities: no cyanosis, clubbing, rash, R and LLE trace-1+ edema.  Neuro: alert & orientedx3, cranial nerves grossly intact. moves all 4 extremities w/o difficulty. Affect pleasant    Telemetry   NSR with PVCs 80-90s   EKG    N/A   Labs    CBC Recent Labs    07/21/18 1442 07/21/18 1443  HGB 16.7 14.3  HCT 49.0 14.9   Basic Metabolic Panel Recent Labs    07/21/18 0557  07/22/18 0426 07/23/18 0500  NA 141   < > 138 135  K 3.9   < > 3.9 4.2  CL 97*  --  97* 98  CO2 31  --  31 27  GLUCOSE 126*  --  117* 129*  BUN 25*  --  24* 19  CREATININE 1.27*  --  1.26* 1.22   CALCIUM 9.0  --  9.0 8.9  MG 2.0  --  2.1  --    < > = values in this interval not displayed.   Liver Function Tests No results for input(s): AST, ALT, ALKPHOS, BILITOT, PROT, ALBUMIN in the last 72 hours. No results for input(s): LIPASE, AMYLASE in the last 72 hours. Cardiac Enzymes No results for input(s): CKTOTAL, CKMB, CKMBINDEX, TROPONINI in the last 72 hours.  BNP: BNP (last 3 results) No results for input(s): BNP in the last 8760 hours.  ProBNP (last 3 results) No results for input(s): PROBNP in the last 8760 hours.   D-Dimer No results for input(s): DDIMER in the last 72 hours. Hemoglobin A1C Recent Labs    07/21/18 0557  HGBA1C 8.6*   Fasting Lipid Panel Recent Labs    07/21/18 0557  CHOL 146  HDL 31*  LDLCALC 99  TRIG 81  CHOLHDL 4.7   Thyroid Function Tests No results for input(s): TSH, T4TOTAL, T3FREE, THYROIDAB in the last 72 hours.  Invalid input(s): FREET3  Other results:   Imaging    Mr Cardiac Morphology W Wo Contrast  Result Date: 07/22/2018 CLINICAL DATA:  Cardiomyopathy of uncertain etiology. EXAM:  CARDIAC MRI TECHNIQUE: The patient was scanned on a 1.5 Tesla GE magnet. A dedicated cardiac coil was used. Functional imaging was done using Fiesta sequences. 2,3, and 4 chamber views were done to assess for RWMA's. Modified Simpson's rule using a short axis stack was used to calculate an ejection fraction on a dedicated work Conservation officer, nature. The patient received 10 cc of Gadavist. After 10 minutes inversion recovery sequences were used to assess for infiltration and scar tissue. CONTRAST:  10 cc Gadavist FINDINGS: Moderate right, small left pleural effusions. Severely dilated left ventricle with normal wall thickness. No LV thrombus noted. Diffuse hypokinesis, EF 17%. Mildly dilated right ventricle with moderate-severe systolic dysfunction, EF 27%. Moderate left atrial enlargement. Normal right atrium. Mild tricuspid regurgitation. Mild  to moderate mitral regurgitation. Trileaflet aortic valve with no regurgitation or stenosis. Delayed enhancement images were difficult. However, there does appear to be late gadolinium enhancement (LGE) in the basal septum in a mid-wall pattern. There was a thin rim of subendocardial LGE in the basal inferolateral wall. Measurements: LVEDV 312 mL LVSV 52 mL LVEF 17% RVEDV 193 mL RVSV 55 mL RVEF 28% IMPRESSION: 1.  Severely dilated LV with EF 17%, diffuse hypokinesis. 2. Mildly dilated RV with EF 28%, moderate to severe systolic dysfunction. 3. Primarily mid-wall LGE in the basal septum, but also rim of subendocardial LGE in the basal inferolateral wall. This is probably a non-coronary pattern, possible prior viral myocarditis. Dalton Mclean Electronically Signed   By: Loralie Champagne M.D.   On: 07/22/2018 23:40     Medications:     Scheduled Medications:  aspirin EC  81 mg Oral Daily   atorvastatin  20 mg Oral q1800   digoxin  0.125 mg Oral Daily   enoxaparin (LOVENOX) injection  40 mg Subcutaneous Q24H   losartan  25 mg Oral QHS   sodium chloride flush  3 mL Intravenous Q12H   sodium chloride flush  3 mL Intravenous Q12H   spironolactone  25 mg Oral Daily    Infusions:  sodium chloride     sodium chloride      PRN Medications: sodium chloride, sodium chloride, acetaminophen, ondansetron (ZOFRAN) IV, sodium chloride flush, sodium chloride flush    Patient Profile  Kree Rafter Wagoneris a 58 y.o.malewith hx of tobacco abuse, SOB, obesity and new onset systolic HF.    Assessment/Plan  1. Acute systolic HF with biventricular dysfunction - Echo 07/16/18 EF 15-20%Restricitve filling pattern RVEF mild to moderately depressed There was moderate pericardial effusion  - Agree that this is likely NICM (viral vs OSA) but with CRFs.  RHC/LHC mild non obs CAD, severe NICM, elevated filling pressures.  - Volume status stable. Start lasix 40 mg twice daily.  - Continue spiro to 25  mg daily.  - Change losartan to bedtime.  - Continue digoxin 0.125 - No b-blocker yet  - CMRI EF 17% and RV 28%.  Possible Viral Cardiomyopathy.   - Consult cardiac rehab.   2. Polycythemia - suspect severe OSA - will need PSG as outpatient  3. Hypokalemia Resolve   4. AKI - creatinine 0.9 -> 1.3-> 1.27 ->1.26 ->1.2  - likely due to low output ATN.  - watch closely with diuresis. Does not seem overtly low output currently.   5. Obesity - needs dietary modification   6. DMII HGb A1C 8.6. Says his PCP wanted him check CBG but to wait on metformin.  - Diabetes coordinator consulted.  - He will need follow up  with his PCP.   7. NSVT-   - NSVT last 2 nights. He declines Armed forces training and education officer and understands the risk.  - K 4.2 . Mag 2.1 .  - Keep K > 4.0. Mg > 2.0  8. CAD Nonobstructive CAD . Mid LAD 40% stenoses, Prox RCA, Mid RCA 20%, Prox CX-Min CX 30 %.  Add Lipitor.   HF follow up set up in the HF clinic.   Home today.     Length of Stay: 4  Amy Clegg, NP  07/23/2018, 8:57 AM  Advanced Heart Failure Team Pager [phone] (M-F; 7a - 4p)  Please contact [facility] for night-coverage after hours (4p -7a ) and weekends on [email or web address]   Patient seen and examined with the above-signed Advanced Practice Provider and/or Housestaff. I personally reviewed laboratory data, imaging studies and relevant notes. I independently examined the patient and formulated the important aspects of the plan. I have edited the note to reflect any of my changes or salient points. I have personally discussed the plan with the patient and/or family.  He looks good today. Volume status improved. cMRI and cath results reviewed. Discussed LifeVest at length. He refuses. He realizes the small risk of SCD without it.   Meds as above. F/u HF clinic 1-2 weeks   Glori Bickers, MD  10:21 AM

## 2018-07-23 NOTE — Plan of Care (Signed)
  Problem: Education: Goal: Knowledge of General Education information will improve Description: Including pain rating scale, medication(s)/side effects and non-pharmacologic comfort measures Outcome: Adequate for Discharge   Problem: Education: Goal: Ability to demonstrate management of disease process will improve Outcome: Adequate for Discharge Goal: Ability to verbalize understanding of medication therapies will improve Outcome: Adequate for Discharge Goal: Individualized Educational Video(s) Outcome: Adequate for Discharge   Problem: Activity: Goal: Capacity to carry out activities will improve Outcome: Adequate for Discharge   Problem: Cardiac: Goal: Ability to achieve and maintain adequate cardiopulmonary perfusion will improve Outcome: Adequate for Discharge   

## 2018-07-24 LAB — POCT I-STAT EG7
Acid-base deficit: 1 mmol/L (ref 0.0–2.0)
Bicarbonate: 23.1 mmol/L (ref 20.0–28.0)
Calcium, Ion: 0.68 mmol/L — CL (ref 1.15–1.40)
HCT: 42 % (ref 39.0–52.0)
Hemoglobin: 14.3 g/dL (ref 13.0–17.0)
O2 Saturation: 73 %
Potassium: 2.5 mmol/L — CL (ref 3.5–5.1)
Sodium: 151 mmol/L — ABNORMAL HIGH (ref 135–145)
TCO2: 24 mmol/L (ref 22–32)
pCO2, Ven: 37.5 mmHg — ABNORMAL LOW (ref 44.0–60.0)
pH, Ven: 7.397 (ref 7.250–7.430)
pO2, Ven: 38 mmHg (ref 32.0–45.0)

## 2018-07-28 ENCOUNTER — Telehealth (HOSPITAL_COMMUNITY): Payer: Self-pay | Admitting: *Deleted

## 2018-07-28 NOTE — Telephone Encounter (Signed)
Pt left VM stating he thinks he is taking too many fluid pills. Patient stated his weight was 204lbs when he was discharged from the hospital and his weight is now 197lbs. He did not take his evening diuretic yesterday and his weight went up to 198lbs. I left a detailed voice message advising patient to take his diuretic as prescribed.

## 2018-07-30 ENCOUNTER — Telehealth (HOSPITAL_COMMUNITY): Payer: Self-pay

## 2018-07-30 NOTE — Telephone Encounter (Signed)
Received fax request for our records from 06/16/2018. Faxed records to [facility]: Orlean Patten at [phone].

## 2018-08-08 ENCOUNTER — Other Ambulatory Visit: Payer: Self-pay

## 2018-08-08 NOTE — Patient Outreach (Signed)
[facility]) Care Management  08/08/2018  MARKICE TORBERT 1960/09/23 [id number]     Referral received from [facility] team for complex case management.  Unsuccessful outreach attempt. Will mail unsuccessful outreach letter and follow-up within 3-4 business days.     PLAN -Will attempt outreach within 3-4 business days.   [place] [phone]

## 2018-08-12 ENCOUNTER — Encounter (HOSPITAL_COMMUNITY): Payer: Self-pay

## 2018-08-12 ENCOUNTER — Other Ambulatory Visit: Payer: Self-pay

## 2018-08-12 ENCOUNTER — Ambulatory Visit (HOSPITAL_COMMUNITY)
Admit: 2018-08-12 | Discharge: 2018-08-12 | Disposition: A | Discharge status: Home / Self Care | Payer: 59 | Source: Ambulatory Visit | Attending: Cardiology | Admitting: Cardiology

## 2018-08-12 VITALS — BP 118/80 | HR 90 | Wt 206.4 lb

## 2018-08-12 DIAGNOSIS — I428 Other cardiomyopathies: Secondary | ICD-10-CM | POA: Insufficient documentation

## 2018-08-12 DIAGNOSIS — Z87891 Personal history of nicotine dependence: Secondary | ICD-10-CM | POA: Insufficient documentation

## 2018-08-12 DIAGNOSIS — I2510 Atherosclerotic heart disease of native coronary artery without angina pectoris: Secondary | ICD-10-CM | POA: Diagnosis not present

## 2018-08-12 DIAGNOSIS — Z79899 Other long term (current) drug therapy: Secondary | ICD-10-CM | POA: Insufficient documentation

## 2018-08-12 DIAGNOSIS — I5020 Unspecified systolic (congestive) heart failure: Secondary | ICD-10-CM | POA: Diagnosis present

## 2018-08-12 DIAGNOSIS — I5022 Chronic systolic (congestive) heart failure: Secondary | ICD-10-CM | POA: Diagnosis not present

## 2018-08-12 DIAGNOSIS — Z7982 Long term (current) use of aspirin: Secondary | ICD-10-CM | POA: Insufficient documentation

## 2018-08-12 DIAGNOSIS — E1122 Type 2 diabetes mellitus with diabetic chronic kidney disease: Secondary | ICD-10-CM | POA: Insufficient documentation

## 2018-08-12 DIAGNOSIS — I472 Ventricular tachycardia: Secondary | ICD-10-CM | POA: Diagnosis not present

## 2018-08-12 DIAGNOSIS — N182 Chronic kidney disease, stage 2 (mild): Secondary | ICD-10-CM | POA: Diagnosis not present

## 2018-08-12 LAB — BASIC METABOLIC PANEL
Anion gap: 15 (ref 5–15)
BUN: 18 mg/dL (ref 6–20)
CO2: 24 mmol/L (ref 22–32)
Calcium: 10 mg/dL (ref 8.9–10.3)
Chloride: 93 mmol/L — ABNORMAL LOW (ref 98–111)
Creatinine, Ser: 1.07 mg/dL (ref 0.61–1.24)
GFR calc Af Amer: >60 mL/min (ref >60)
GFR calc non Af Amer: >60 mL/min (ref >60)
Glucose, Bld: 206 mg/dL — ABNORMAL HIGH (ref 70–99)
Potassium: 5.1 mmol/L (ref 3.5–5.1)
Sodium: 132 mmol/L — ABNORMAL LOW (ref 135–145)

## 2018-08-12 LAB — DIGOXIN LEVEL: Digoxin Level: 0.4 ng/mL — ABNORMAL LOW (ref 0.8–2.0)

## 2018-08-12 LAB — MAGNESIUM: Magnesium: 2.1 mg/dL (ref 1.7–2.4)

## 2018-08-12 MED ORDER — FUROSEMIDE 40 MG PO TABS: 40.0000 mg | ORAL_TABLET | Freq: Every day | ORAL | 3 refills | Status: DC

## 2018-08-12 MED ORDER — CARVEDILOL 3.125 MG PO TABS: 3.1250 mg | ORAL_TABLET | Freq: Two times a day (BID) | ORAL | 3 refills | Status: DC

## 2018-08-12 NOTE — Progress Notes (Signed)
PCP: Dr Luan Pulling Primary HF Cardiologist: Dr Haroldine Laws   HPI: Fernando Williams a 58 y.o.malewith hx of tobacco abuse, SOB, obesity and new onset systolic HF July 7782.   Admitted with increased dyspnea and volume overload in the setting of acute systolic heart failure. Diuresed > 20 pounds and underwent cath that showed NICM, min obstructive cad, and mild-mod elevated filling pressures.CMRI completed and showed LVEF 17% , RV EF 28% and LGE in basal septum with possible viral myocarditis. HF medications initiated.   Today he returns for post hospital follow up. Overall feeling fine. Denies SOB/PND/Orthopnea. No chest pain. Appetite ok. No fever or chills. Weight at home trending down from 204-->197 pounds. Taking all medications.  ECHO 7/1/20202 EF 15-20%   CMRI 07/23/2018  1.  Severely dilated LV with EF 17%, diffuse hypokinesis. 2. Mildly dilated RV with EF 28%, moderate to severe systolic dysfunction. 3. Primarily mid-wall LGE in the basal septum, but also rim of subendocardial LGE in the basal inferolateral wall. This is probably a non-coronary pattern, possible prior viral myocarditis.  ROS: All systems negative except as listed in HPI, PMH and Problem List.  SH:  Social History   Socioeconomic History  . Marital status: Divorced    Spouse name: Not on file  . Number of children: Not on file  . Years of education: Not on file  . Highest education level: Not on file  Occupational History  . Not on file  Social Needs  . Financial resource strain: Not on file  . Food insecurity    Worry: Not on file    Inability: Not on file  . Transportation needs    Medical: Not on file    Non-medical: Not on file  Tobacco Use  . Smoking status: Former Smoker    Types: Cigars    Quit date: 04/07/2018    Years since quitting: 0.3  . Smokeless tobacco: Never Used  Substance and Sexual Activity  . Alcohol use: No  . Drug use: No  . Sexual activity: Yes    Birth  control/protection: None  Lifestyle  . Physical activity    Days per week: Not on file    Minutes per session: Not on file  . Stress: Not on file  Relationships  . Social Herbalist on phone: Not on file    Gets together: Not on file    Attends religious service: Not on file    Active member of club or organization: Not on file    Attends meetings of clubs or organizations: Not on file    Relationship status: Not on file  . Intimate partner violence    Fear of current or ex partner: Not on file    Emotionally abused: Not on file    Physically abused: Not on file    Forced sexual activity: Not on file  Other Topics Concern  . Not on file  Social History Narrative  . Not on file    FH: No family history on file.  Past Medical History:  Diagnosis Date  . CHF (congestive heart failure) ([place])   . Pneumonia     Current Outpatient Medications  Medication Sig Dispense Refill  . albuterol (PROVENTIL HFA;VENTOLIN HFA) 108 (90 Base) MCG/ACT inhaler Inhale 2 puffs into the lungs 3 (three) times daily. (Patient taking differently: Inhale 2 puffs into the lungs every 6 (six) hours as needed for wheezing or shortness of breath. ) 1 Inhaler 1  . aspirin EC  81 MG EC tablet Take 1 tablet (81 mg total) by mouth daily. 30 tablet 6  . atorvastatin (LIPITOR) 20 MG tablet Take 1 tablet (20 mg total) by mouth daily at 6 PM. 30 tablet 6  . digoxin (LANOXIN) 0.125 MG tablet Take 1 tablet (0.125 mg total) by mouth daily. 30 tablet 6  . furosemide (LASIX) 40 MG tablet Take 1 tablet (40 mg total) by mouth 2 (two) times daily. 60 tablet 6  . losartan (COZAAR) 25 MG tablet Take 1 tablet (25 mg total) by mouth at bedtime. 30 tablet 6  . Potassium Chloride ER 20 MEQ TBCR Take 20 tablets by mouth daily. 30 tablet 6  . spironolactone (ALDACTONE) 25 MG tablet Take 1 tablet (25 mg total) by mouth daily. 30 tablet 6   No current facility-administered medications for this encounter.     Vitals:    08/12/18 1019  BP: 118/80  Pulse: 90  SpO2: 98%  Weight: 93.6 kg (206 lb 6.4 oz)   Wt Readings from Last 3 Encounters:  08/12/18 93.6 kg (206 lb 6.4 oz)  07/23/18 92.7 kg (204 lb 6.4 oz)  04/09/18 104.3 kg (230 lb)    PHYSICAL EXAM: General:  Well appearing. No resp difficulty HEENT: normal Neck: supple. JVP flat. Carotids 2+ bilaterally; no bruits. No lymphadenopathy or thryomegaly appreciated. Cor: PMI normal. Regular rate & rhythm. No rubs, gallops or murmurs. Lungs: clear Abdomen: soft, nontender, nondistended. No hepatosplenomegaly. No bruits or masses. Good bowel sounds. Extremities: no cyanosis, clubbing, rash, edema Neuro: alert & orientedx3, cranial nerves grossly intact. Moves all 4 extremities w/o difficulty. Affect pleasant.  EKG: NSR 84 bpm QRS 86 ms   ASSESSMENT & PLAN: 1. Chronic Systolic HF with biventricular dysfunction - Echo 07/16/18 EF 15-20%Restricitve filling pattern RVEF mild to moderately depressed There was moderate pericardial effusion - RHC/LHC mild non obs CAD, severe NICM, elevated filling pressures.  - 07/22/2018 CMRI completed and showed LVEF 17% , RV EF 28% and LGE in basal septum with possible viral myocarditis.  - NYHA I-II. - Add 3.125 mg coreg twice a day.    -Continue spiro to 25mg  daily. -Change losartan to bedtime. -Continuedigoxin 0.125 - Check BMET/Dig level - Plan to check ECHO in 3-4 months after HF meds optimized.  2. CKD Stage II Check BMET today.  3. DMII HGb A1C 8.6.  - Set up f/u  With PCP.   5. NSVT-  -Life Vest ordered for discharge but he refused.  - EKG today- NSR 84 bpm no PVCs.  7. CAD Nonobstructive CAD . Mid LAD 40% stenoses, Prox RCA, Mid RCA 20%, Prox CX-Min CX 30 %.  Continue lipitor and aspirin.  - No chest pain.    Follow up in 3-4 weeks. Discussed medications changes. I provided him with a work note. Amy Clegg NP-C  3:13 PM

## 2018-08-12 NOTE — Progress Notes (Signed)
Return to work note

## 2018-08-12 NOTE — Patient Instructions (Signed)
Lab work done today. We will notify you of any abnormal lab work. We will notify you of any abnormal lab work.  EKG done today.  DECREASE Furosemide to 40mg  daily.  START Carvedilol 3.125mg . one tab two times daily.  Please follow up with the [facility] in 3-4 weeks.  At the [facility], you and your health needs are our priority. As part of our continuing mission to provide you with exceptional heart care, we have created designated Provider Care Teams. These Care Teams include your primary Cardiologist (physician) and Advanced Practice Providers (APPs- Physician Assistants and Nurse Practitioners) who all work together to provide you with the care you need, when you need it.   You may see any of the following providers on your designated Care Team at your next follow up: Marland Kitchen Dr Glori Bickers . Dr Loralie Champagne . Darrick Grinder, NP

## 2018-08-14 ENCOUNTER — Other Ambulatory Visit: Payer: Self-pay

## 2018-08-14 NOTE — Patient Outreach (Signed)
[facility]) Care Management  08/14/2018  Fernando Williams [birth year] [id number]    2nd unsuccessful outreach attempt. Phone rang multiple times without option to leave a voice message. Will follow-up within 3-4 business days.    [place] [phone]

## 2018-08-20 ENCOUNTER — Other Ambulatory Visit: Payer: Self-pay

## 2018-08-20 NOTE — Patient Outreach (Signed)
[facility]) Care Management  08/20/2018  Fernando Williams 01-23-1960 [id number]    3rd unsuccessful outreach attempt. Case closure pending.      [place] [phone]

## 2018-09-11 ENCOUNTER — Encounter (HOSPITAL_COMMUNITY): Payer: 59

## 2018-09-22 NOTE — Progress Notes (Signed)
PCP: Dr Luan Pulling Primary HF Cardiologist: Dr Haroldine Laws   HPI: Fernando Nettles Wagoneris a 58 y.o.malewith hx of tobacco abuse, SOB, obesity and new onset systolic HF July [id number].   Admitted with increased dyspnea and volume overload in the setting of acute systolic heart failure. Diuresed > 20 pounds and underwent cath that showed NICM, min obstructive cad, and mild-mod elevated filling pressures.CMRI completed and showed LVEF 17% , RV EF 28% and LGE in basal septum with possible viral myocarditis. HF medications initiated.   Today he returns for Hf follow up. Last visit low dose carvedilol was started. Overall feeling fine. Denies SOB/PND/Orthopnea. Appetite ok. No fever or chills. Weight at home trending up and 209-211 pounds. Taking all medications. Working full time.   ECHO 7/1/20202 EF 15-20%   CMRI 07/23/2018  1.  Severely dilated LV with EF 17%, diffuse hypokinesis. 2. Mildly dilated RV with EF 28%, moderate to severe systolic dysfunction. 3. Primarily mid-wall LGE in the basal septum, but also rim of subendocardial LGE in the basal inferolateral wall. This is probably a non-coronary pattern, possible prior viral myocarditis.  ROS: All systems negative except as listed in HPI, PMH and Problem List.  SH:  Social History   Socioeconomic History  . Marital status: Divorced    Spouse name: Not on file  . Number of children: Not on file  . Years of education: Not on file  . Highest education level: Not on file  Occupational History  . Not on file  Social Needs  . Financial resource strain: Not on file  . Food insecurity    Worry: Not on file    Inability: Not on file  . Transportation needs    Medical: Not on file    Non-medical: Not on file  Tobacco Use  . Smoking status: Former Smoker    Types: Cigars    Quit date: 04/07/2018    Years since quitting: 0.4  . Smokeless tobacco: Never Used  Substance and Sexual Activity  . Alcohol use: No  . Drug use: No  . Sexual  activity: Yes    Birth control/protection: None  Lifestyle  . Physical activity    Days per week: Not on file    Minutes per session: Not on file  . Stress: Not on file  Relationships  . Social Herbalist on phone: Not on file    Gets together: Not on file    Attends religious service: Not on file    Active member of club or organization: Not on file    Attends meetings of clubs or organizations: Not on file    Relationship status: Not on file  . Intimate partner violence    Fear of current or ex partner: Not on file    Emotionally abused: Not on file    Physically abused: Not on file    Forced sexual activity: Not on file  Other Topics Concern  . Not on file  Social History Narrative  . Not on file    FH: No family history on file.  Past Medical History:  Diagnosis Date  . CHF (congestive heart failure) ([place])   . Pneumonia     Current Outpatient Medications  Medication Sig Dispense Refill  . aspirin EC 81 MG EC tablet Take 1 tablet (81 mg total) by mouth daily. 30 tablet 6  . atorvastatin (LIPITOR) 20 MG tablet Take 1 tablet (20 mg total) by mouth daily at 6 PM. 30 tablet 6  .  carvedilol (COREG) 3.125 MG tablet Take 1 tablet (3.125 mg total) by mouth 2 (two) times daily. 180 tablet 3  . digoxin (LANOXIN) 0.125 MG tablet Take 1 tablet (0.125 mg total) by mouth daily. 30 tablet 6  . furosemide (LASIX) 40 MG tablet Take 1 tablet (40 mg total) by mouth daily. 90 tablet 3  . losartan (COZAAR) 25 MG tablet Take 1 tablet (25 mg total) by mouth at bedtime. 30 tablet 6  . Potassium Chloride ER 20 MEQ TBCR Take 20 tablets by mouth daily. 30 tablet 6  . spironolactone (ALDACTONE) 25 MG tablet Take 1 tablet (25 mg total) by mouth daily. 30 tablet 6  . albuterol (PROVENTIL HFA;VENTOLIN HFA) 108 (90 Base) MCG/ACT inhaler Inhale 2 puffs into the lungs 3 (three) times daily. (Patient taking differently: Inhale 2 puffs into the lungs every 6 (six) hours as needed for wheezing or  shortness of breath. ) 1 Inhaler 1   No current facility-administered medications for this encounter.     Vitals:   09/23/18 0928  BP: 128/80  Pulse: 76  SpO2: 99%  Weight: 99.3 kg (219 lb)   Wt Readings from Last 3 Encounters:  09/23/18 99.3 kg (219 lb)  08/12/18 93.6 kg (206 lb 6.4 oz)  07/23/18 92.7 kg (204 lb 6.4 oz)    PHYSICAL EXAM: General:  Well appearing. No resp difficulty HEENT: normal Neck: supple. no JVD. Carotids 2+ bilat; no bruits. No lymphadenopathy or thryomegaly appreciated. Cor: PMI nondisplaced. Regular rate & rhythm. No rubs, gallops or murmurs. Lungs: clear Abdomen: soft, nontender, nondistended. No hepatosplenomegaly. No bruits or masses. Good bowel sounds. Extremities: no cyanosis, clubbing, rash, edema Neuro: alert & orientedx3, cranial nerves grossly intact. moves all 4 extremities w/o difficulty. Affect pleasant   ASSESSMENT & PLAN: 1. Chronic Systolic HF with biventricular dysfunction - Echo 07/16/18 EF 15-20%Restricitve filling pattern RVEF mild to moderately depressed There was moderate pericardial effusion - RHC/LHC mild non obs CAD, severe NICM, elevated filling pressures.  - 07/22/2018 CMRI completed and showed LVEF 17% , RV EF 28% and LGE in basal septum with possible viral myocarditis.  -NYHA II. Doing great today.  - Volume status stable despite weight gain. Discussed taking extra lasix as needed.  - Increase coreg to 6.25 mg tiwce a day.     -Continue spiro to 25mg  daily. -Change losartan to bedtime. -Continuedigoxin 0.125. (Dig level 0.4, 08/12/18)  -  Plan to check ECHO next visit.  2. CKD Stage II 3. DMII HGb A1C 8.6.  - Needs follow up with PCP.    5. NSVT -Life Vest ordered for discharge but he refused.  7. CAD Nonobstructive CAD . Mid LAD 40% stenoses, Prox RCA, Mid RCA 20%, Prox CX-Min CX 30 %.  Continue lipitor and aspirin. - No chest pain.   Follow up 4-6 weeks with an ECHO. Greater than 50% of the (total  minutes 25) visit spent in counseling/coordination of care regarding the above medication changes and ECHO test.     Aryanne Gilleland NP-C  9:32 AM

## 2018-09-23 ENCOUNTER — Encounter (HOSPITAL_COMMUNITY): Payer: Self-pay

## 2018-09-23 ENCOUNTER — Ambulatory Visit (HOSPITAL_COMMUNITY)
Admission: RE | Admit: 2018-09-23 | Discharge: 2018-09-23 | Disposition: A | Discharge status: Home / Self Care | Payer: 59 | Source: Ambulatory Visit | Attending: Internal Medicine | Admitting: Internal Medicine

## 2018-09-23 ENCOUNTER — Other Ambulatory Visit: Payer: Self-pay

## 2018-09-23 VITALS — BP 128/80 | HR 76 | Wt 219.0 lb

## 2018-09-23 DIAGNOSIS — Z79899 Other long term (current) drug therapy: Secondary | ICD-10-CM | POA: Diagnosis not present

## 2018-09-23 DIAGNOSIS — E669 Obesity, unspecified: Secondary | ICD-10-CM | POA: Insufficient documentation

## 2018-09-23 DIAGNOSIS — Z7982 Long term (current) use of aspirin: Secondary | ICD-10-CM | POA: Insufficient documentation

## 2018-09-23 DIAGNOSIS — I2510 Atherosclerotic heart disease of native coronary artery without angina pectoris: Secondary | ICD-10-CM | POA: Diagnosis not present

## 2018-09-23 DIAGNOSIS — E1122 Type 2 diabetes mellitus with diabetic chronic kidney disease: Secondary | ICD-10-CM | POA: Insufficient documentation

## 2018-09-23 DIAGNOSIS — I428 Other cardiomyopathies: Secondary | ICD-10-CM | POA: Diagnosis not present

## 2018-09-23 DIAGNOSIS — I5022 Chronic systolic (congestive) heart failure: Secondary | ICD-10-CM

## 2018-09-23 DIAGNOSIS — I472 Ventricular tachycardia: Secondary | ICD-10-CM | POA: Diagnosis not present

## 2018-09-23 DIAGNOSIS — N182 Chronic kidney disease, stage 2 (mild): Secondary | ICD-10-CM | POA: Insufficient documentation

## 2018-09-23 DIAGNOSIS — Z87891 Personal history of nicotine dependence: Secondary | ICD-10-CM | POA: Diagnosis not present

## 2018-09-23 MED ORDER — CARVEDILOL 6.25 MG PO TABS: 6.2500 mg | ORAL_TABLET | Freq: Two times a day (BID) | ORAL | 3 refills | Status: DC

## 2018-09-23 NOTE — Patient Instructions (Signed)
STOP Potassium  INCREASE Carvedilol 6.25mg  1 tab twice daily.  Your physician has requested that you have an echocardiogram. Echocardiography is a painless test that uses sound waves to create images of your heart. It provides your doctor with information about the size and shape of your heart and how well your heart's chambers and valves are working. This procedure takes approximately one hour. There are no restrictions for this procedure. This will be done at your next appointment.  Please follow up with the [facility] in 4-6 weeks.  At the [facility], you and your health needs are our priority. As part of our continuing mission to provide you with exceptional heart care, we have created designated Provider Care Teams. These Care Teams include your primary Cardiologist (physician) and Advanced Practice Providers (APPs- Physician Assistants and Nurse Practitioners) who all work together to provide you with the care you need, when you need it.   You may see any of the following providers on your designated Care Team at your next follow up: Marland Kitchen Dr Glori Bickers . Dr Loralie Champagne . Darrick Grinder, NP   Please be sure to bring in all your medications bottles to every appointment.

## 2018-11-12 ENCOUNTER — Encounter (HOSPITAL_COMMUNITY): Payer: Self-pay | Admitting: Internal Medicine

## 2018-11-12 ENCOUNTER — Ambulatory Visit (HOSPITAL_BASED_OUTPATIENT_CLINIC_OR_DEPARTMENT_OTHER)
Admission: RE | Admit: 2018-11-12 | Discharge: 2018-11-12 | Disposition: A | Discharge status: Home / Self Care | Payer: 59 | Source: Ambulatory Visit | Attending: Internal Medicine | Admitting: Internal Medicine

## 2018-11-12 ENCOUNTER — Ambulatory Visit (HOSPITAL_COMMUNITY)
Admission: RE | Admit: 2018-11-12 | Discharge: 2018-11-12 | Disposition: A | Discharge status: Home / Self Care | Payer: 59 | Source: Ambulatory Visit | Attending: Internal Medicine | Admitting: Internal Medicine

## 2018-11-12 ENCOUNTER — Other Ambulatory Visit: Payer: Self-pay

## 2018-11-12 VITALS — BP 129/68 | HR 66 | Wt 215.3 lb

## 2018-11-12 DIAGNOSIS — N182 Chronic kidney disease, stage 2 (mild): Secondary | ICD-10-CM | POA: Diagnosis not present

## 2018-11-12 DIAGNOSIS — I5022 Chronic systolic (congestive) heart failure: Secondary | ICD-10-CM | POA: Insufficient documentation

## 2018-11-12 DIAGNOSIS — I428 Other cardiomyopathies: Secondary | ICD-10-CM | POA: Diagnosis not present

## 2018-11-12 DIAGNOSIS — I2510 Atherosclerotic heart disease of native coronary artery without angina pectoris: Secondary | ICD-10-CM

## 2018-11-12 DIAGNOSIS — Z87891 Personal history of nicotine dependence: Secondary | ICD-10-CM | POA: Insufficient documentation

## 2018-11-12 DIAGNOSIS — Z7982 Long term (current) use of aspirin: Secondary | ICD-10-CM | POA: Insufficient documentation

## 2018-11-12 DIAGNOSIS — Z6830 Body mass index (BMI) 30.0-30.9, adult: Secondary | ICD-10-CM | POA: Insufficient documentation

## 2018-11-12 DIAGNOSIS — E669 Obesity, unspecified: Secondary | ICD-10-CM | POA: Diagnosis not present

## 2018-11-12 DIAGNOSIS — E1122 Type 2 diabetes mellitus with diabetic chronic kidney disease: Secondary | ICD-10-CM | POA: Insufficient documentation

## 2018-11-12 DIAGNOSIS — Z79899 Other long term (current) drug therapy: Secondary | ICD-10-CM | POA: Insufficient documentation

## 2018-11-12 LAB — BASIC METABOLIC PANEL
Anion gap: 9 (ref 5–15)
BUN: 17 mg/dL (ref 6–20)
CO2: 24 mmol/L (ref 22–32)
Calcium: 9.4 mg/dL (ref 8.9–10.3)
Chloride: 101 mmol/L (ref 98–111)
Creatinine, Ser: 0.96 mg/dL (ref 0.61–1.24)
GFR calc Af Amer: >60 mL/min (ref >60)
GFR calc non Af Amer: >60 mL/min (ref >60)
Glucose, Bld: 263 mg/dL — ABNORMAL HIGH (ref 70–99)
Potassium: 4.9 mmol/L (ref 3.5–5.1)
Sodium: 134 mmol/L — ABNORMAL LOW (ref 135–145)

## 2018-11-12 LAB — BRAIN NATRIURETIC PEPTIDE: B Natriuretic Peptide: 126.6 pg/mL — ABNORMAL HIGH (ref 0.0–100.0)

## 2018-11-12 MED ORDER — ENTRESTO 24-26 MG PO TABS: 1.0000 | ORAL_TABLET | Freq: Two times a day (BID) | ORAL | 11 refills | Status: DC

## 2018-11-12 NOTE — Progress Notes (Signed)
ADVANCED HF CLINIC NOTE PCP: Dr Luan Pulling Primary HF Cardiologist: Dr Haroldine Laws   HPI: Fernando Williams a 58 y.o.malewith hx of tobacco abuse, SOB, obesity and new onset systolic HF July [id number].   Admitted with increased dyspnea and volume overload in the setting of acute systolic heart failure. Diuresed > 20 pounds and underwent cath that showed NICM, min obstructive cad, and mild-mod elevated filling pressures.CMRI completed and showed LVEF 17% , RV EF 28% and LGE in basal septum with possible viral myocarditis. HF medications initiated.   Today he returns for Hf follow up. Feels good. Can work up to 12 hours days. No SOB, edema, orthopnea or PND.  At last visit stopped potassium but got cramps and he restarted. No dizziness. Compliant with all meds.   Echo today EF 25-30%  ECHO 7/1/20202 EF 15-20%   CMRI 07/23/2018  1.  Severely dilated LV with EF 17%, diffuse hypokinesis. 2. Mildly dilated RV with EF 28%, moderate to severe systolic dysfunction. 3. Primarily mid-wall LGE in the basal septum, but also rim of subendocardial LGE in the basal inferolateral wall. This is probably a non-coronary pattern, possible prior viral myocarditis.  ROS: All systems negative except as listed in HPI, PMH and Problem List.  SH:  Social History   Socioeconomic History  . Marital status: Divorced    Spouse name: Not on file  . Number of children: Not on file  . Years of education: Not on file  . Highest education level: Not on file  Occupational History  . Not on file  Social Needs  . Financial resource strain: Not on file  . Food insecurity    Worry: Not on file    Inability: Not on file  . Transportation needs    Medical: Not on file    Non-medical: Not on file  Tobacco Use  . Smoking status: Former Smoker    Types: Cigars    Quit date: 04/07/2018    Years since quitting: 0.6  . Smokeless tobacco: Never Used  Substance and Sexual Activity  . Alcohol use: No  . Drug use: No   . Sexual activity: Yes    Birth control/protection: None  Lifestyle  . Physical activity    Days per week: Not on file    Minutes per session: Not on file  . Stress: Not on file  Relationships  . Social Herbalist on phone: Not on file    Gets together: Not on file    Attends religious service: Not on file    Active member of club or organization: Not on file    Attends meetings of clubs or organizations: Not on file    Relationship status: Not on file  . Intimate partner violence    Fear of current or ex partner: Not on file    Emotionally abused: Not on file    Physically abused: Not on file    Forced sexual activity: Not on file  Other Topics Concern  . Not on file  Social History Narrative  . Not on file    FH: No family history on file.  Past Medical History:  Diagnosis Date  . CHF (congestive heart failure) ([place])   . Pneumonia     Current Outpatient Medications  Medication Sig Dispense Refill  . albuterol (PROVENTIL HFA;VENTOLIN HFA) 108 (90 Base) MCG/ACT inhaler Inhale 2 puffs into the lungs 3 (three) times daily. (Patient taking differently: Inhale 2 puffs into the lungs every 6 (  six) hours as needed for wheezing or shortness of breath. ) 1 Inhaler 1  . aspirin EC 81 MG EC tablet Take 1 tablet (81 mg total) by mouth daily. 30 tablet 6  . atorvastatin (LIPITOR) 20 MG tablet Take 1 tablet (20 mg total) by mouth daily at 6 PM. 30 tablet 6  . B Complex-C-Folic Acid (SUPER B COMPLEX/FA/VIT C PO) Take by mouth.    . carvedilol (COREG) 6.25 MG tablet Take 1 tablet (6.25 mg total) by mouth 2 (two) times daily with a meal. 180 tablet 3  . digoxin (LANOXIN) 0.125 MG tablet Take 1 tablet (0.125 mg total) by mouth daily. 30 tablet 6  . furosemide (LASIX) 40 MG tablet Take 1 tablet (40 mg total) by mouth daily. 90 tablet 3  . glucosamine-chondroitin 500-400 MG tablet Take 1 tablet by mouth 3 (three) times daily.    Marland Kitchen losartan (COZAAR) 25 MG tablet Take 1 tablet (25  mg total) by mouth at bedtime. 30 tablet 6  . Magnesium 250 MG TABS Take by mouth.    . Multiple Vitamin (MULTIVITAMIN) tablet Take 1 tablet by mouth daily.    . potassium chloride (KLOR-CON) 20 MEQ packet Take by mouth once.    Marland Kitchen spironolactone (ALDACTONE) 25 MG tablet Take 1 tablet (25 mg total) by mouth daily. 30 tablet 6  . vitamin B-12 (CYANOCOBALAMIN) 500 MCG tablet Take 1,000 mcg by mouth daily.     No current facility-administered medications for this encounter.     Vitals:   11/12/18 0905  BP: 129/68  Pulse: 66  SpO2: 99%  Weight: 97.7 kg (215 lb 4.8 oz)   Wt Readings from Last 3 Encounters:  11/12/18 97.7 kg (215 lb 4.8 oz)  09/23/18 99.3 kg (219 lb)  08/12/18 93.6 kg (206 lb 6.4 oz)    PHYSICAL EXAM: General:  Well appearing. No resp difficulty HEENT: normal Neck: supple. no JVD. Carotids 2+ bilat; no bruits. No lymphadenopathy or thryomegaly appreciated. Cor: PMI nondisplaced. Regular rate & rhythm. No rubs, gallops or murmurs. Lungs: clear Abdomen: soft, nontender, nondistended. No hepatosplenomegaly. No bruits or masses. Good bowel sounds. Extremities: no cyanosis, clubbing, rash, edema Neuro: alert & orientedx3, cranial nerves grossly intact. moves all 4 extremities w/o difficulty. Affect pleasant    ASSESSMENT & PLAN: 1. Chronic Systolic HF with biventricular dysfunction - Echo 07/16/18 EF 15-20%Restricitve filling pattern RVEF mild to moderately depressed There was moderate pericardial effusion - RHC/LHC mild non obs CAD, severe NICM, elevated filling pressures.  - 07/22/2018 CMRI completed and showed LVEF 17% , RV EF 28% and LGE in basal septum with possible viral myocarditis.  - echo today EF 30-35% (mild improvement) - Personally reviewed - Symptomatically much improved NYHA I .  -  Continue coreg to 6.25 mg tiwce a day.     - Continue spiro to 25mg  daily. -Change losartan to Entresto 24/26 bid -Continuedigoxin 0.125. (Dig level 0.4, 08/12/18)  for now. Likely stop at next visit - Consider SGLT2i soon   - Refuses lifeVest.  - EF improving. No ICD yet until meds fully titrated.  2. CKD Stage II - check labs today 3. DMII - HGb A1C 8.6.  - Needs follow up with PCP.    - Consider SGLT2i soon  4. CAD -Nonobstructive CAD . Mid LAD 40% stenoses, Prox RCA, Mid RCA 20%, Prox CX-Min CX 30 %.  - No s/s ischemia. Continue lipitor and aspirin. - As above. Consider SGLT2i    Glori Bickers MD 9:44  AM

## 2018-11-12 NOTE — Progress Notes (Signed)
  Echocardiogram 2D Echocardiogram has been performed.  Fernando Williams 11/12/2018, 9:04 AM

## 2018-11-12 NOTE — Patient Instructions (Signed)
STOP Losartan START Entresto 24/26 mg, one tab twice daily  Labs today We will only contact you if something comes back abnormal or we need to make some changes. Otherwise no news is good news!  Your physician recommends that you schedule a follow-up appointment in: 4-6 weeks  in the Advanced Practitioners (PA/NP) Clinic    Do the following things EVERYDAY: 1) Weigh yourself in the morning before breakfast. Write it down and keep it in a log. 2) Take your medicines as prescribed 3) Eat low salt foods-Limit salt (sodium) to 2000 mg per day.  4) Stay as active as you can everyday 5) Limit all fluids for the day to less than 2 liters  At the [facility], you and your health needs are our priority. As part of our continuing mission to provide you with exceptional heart care, we have created designated Provider Care Teams. These Care Teams include your primary Cardiologist (physician) and Advanced Practice Providers (APPs- Physician Assistants and Nurse Practitioners) who all work together to provide you with the care you need, when you need it.   You may see any of the following providers on your designated Care Team at your next follow up: Marland Kitchen Dr Glori Bickers . Dr Loralie Champagne . Darrick Grinder, NP . Lyda Jester, PA   Please be sure to bring in all your medications bottles to every appointment.

## 2018-12-15 ENCOUNTER — Encounter (HOSPITAL_COMMUNITY): Payer: Self-pay | Admitting: Emergency Medicine

## 2018-12-15 ENCOUNTER — Telehealth (HOSPITAL_COMMUNITY): Payer: Self-pay | Admitting: Pharmacy Technician

## 2018-12-15 ENCOUNTER — Inpatient Hospital Stay (HOSPITAL_COMMUNITY)
Admission: EM | Admit: 2018-12-15 | Discharge: 2018-12-19 | DRG: 638 | Disposition: A | Discharge status: Home Health | Payer: 59 | Source: Ambulatory Visit | Attending: Pulmonary Disease | Admitting: Pulmonary Disease

## 2018-12-15 ENCOUNTER — Ambulatory Visit (INDEPENDENT_AMBULATORY_CARE_PROVIDER_SITE_OTHER)
Admission: EM | Admit: 2018-12-15 | Discharge: 2018-12-15 | Disposition: A | Discharge status: Home / Self Care | Payer: 59 | Source: Home / Self Care | Attending: Emergency Medicine | Admitting: Emergency Medicine

## 2018-12-15 ENCOUNTER — Other Ambulatory Visit: Payer: Self-pay

## 2018-12-15 ENCOUNTER — Other Ambulatory Visit (HOSPITAL_COMMUNITY): Payer: Self-pay

## 2018-12-15 ENCOUNTER — Emergency Department (HOSPITAL_COMMUNITY): Payer: 59

## 2018-12-15 DIAGNOSIS — Z79899 Other long term (current) drug therapy: Secondary | ICD-10-CM | POA: Diagnosis not present

## 2018-12-15 DIAGNOSIS — E11628 Type 2 diabetes mellitus with other skin complications: Secondary | ICD-10-CM

## 2018-12-15 DIAGNOSIS — Z20828 Contact with and (suspected) exposure to other viral communicable diseases: Secondary | ICD-10-CM | POA: Diagnosis present

## 2018-12-15 DIAGNOSIS — Z87891 Personal history of nicotine dependence: Secondary | ICD-10-CM | POA: Diagnosis not present

## 2018-12-15 DIAGNOSIS — M869 Osteomyelitis, unspecified: Secondary | ICD-10-CM | POA: Diagnosis present

## 2018-12-15 DIAGNOSIS — E1169 Type 2 diabetes mellitus with other specified complication: Secondary | ICD-10-CM | POA: Diagnosis present

## 2018-12-15 DIAGNOSIS — L089 Local infection of the skin and subcutaneous tissue, unspecified: Secondary | ICD-10-CM | POA: Diagnosis not present

## 2018-12-15 DIAGNOSIS — N182 Chronic kidney disease, stage 2 (mild): Secondary | ICD-10-CM | POA: Diagnosis present

## 2018-12-15 DIAGNOSIS — E785 Hyperlipidemia, unspecified: Secondary | ICD-10-CM | POA: Diagnosis present

## 2018-12-15 DIAGNOSIS — I5022 Chronic systolic (congestive) heart failure: Secondary | ICD-10-CM | POA: Diagnosis present

## 2018-12-15 DIAGNOSIS — I130 Hypertensive heart and chronic kidney disease with heart failure and stage 1 through stage 4 chronic kidney disease, or unspecified chronic kidney disease: Secondary | ICD-10-CM | POA: Diagnosis present

## 2018-12-15 DIAGNOSIS — Z833 Family history of diabetes mellitus: Secondary | ICD-10-CM | POA: Diagnosis not present

## 2018-12-15 DIAGNOSIS — L03031 Cellulitis of right toe: Secondary | ICD-10-CM | POA: Diagnosis present

## 2018-12-15 DIAGNOSIS — Z7982 Long term (current) use of aspirin: Secondary | ICD-10-CM | POA: Diagnosis not present

## 2018-12-15 DIAGNOSIS — B952 Enterococcus as the cause of diseases classified elsewhere: Secondary | ICD-10-CM | POA: Diagnosis present

## 2018-12-15 DIAGNOSIS — E1165 Type 2 diabetes mellitus with hyperglycemia: Secondary | ICD-10-CM | POA: Diagnosis present

## 2018-12-15 DIAGNOSIS — E1122 Type 2 diabetes mellitus with diabetic chronic kidney disease: Secondary | ICD-10-CM | POA: Diagnosis present

## 2018-12-15 LAB — CBC WITH DIFFERENTIAL/PLATELET
Abs Immature Granulocytes: 0.17 10*3/uL — ABNORMAL HIGH (ref 0.00–0.07)
Basophils Absolute: 0.1 10*3/uL (ref 0.0–0.1)
Basophils Relative: 1 %
Eosinophils Absolute: 0.3 10*3/uL (ref 0.0–0.5)
Eosinophils Relative: 2 %
HCT: 43.8 % (ref 39.0–52.0)
Hemoglobin: 14.8 g/dL (ref 13.0–17.0)
Immature Granulocytes: 1 %
Lymphocytes Relative: 20 %
Lymphs Abs: 2.4 10*3/uL (ref 0.7–4.0)
MCH: 32.4 pg (ref 26.0–34.0)
MCHC: 33.8 g/dL (ref 30.0–36.0)
MCV: 95.8 fL (ref 80.0–100.0)
Monocytes Absolute: 0.8 10*3/uL (ref 0.1–1.0)
Monocytes Relative: 6 %
Neutro Abs: 8.4 10*3/uL — ABNORMAL HIGH (ref 1.7–7.7)
Neutrophils Relative %: 70 %
Platelets: 289 10*3/uL (ref 150–400)
RBC: 4.57 MIL/uL (ref 4.22–5.81)
RDW: 11.4 % — ABNORMAL LOW (ref 11.5–15.5)
WBC: 12.1 10*3/uL — ABNORMAL HIGH (ref 4.0–10.5)
nRBC: 0 % (ref 0.0–0.2)

## 2018-12-15 LAB — COMPREHENSIVE METABOLIC PANEL
ALT: 28 U/L (ref 0–44)
AST: 19 U/L (ref 15–41)
Albumin: 3.8 g/dL (ref 3.5–5.0)
Alkaline Phosphatase: 63 U/L (ref 38–126)
Anion gap: 8 (ref 5–15)
BUN: 20 mg/dL (ref 6–20)
CO2: 26 mmol/L (ref 22–32)
Calcium: 9.2 mg/dL (ref 8.9–10.3)
Chloride: 96 mmol/L — ABNORMAL LOW (ref 98–111)
Creatinine, Ser: 0.85 mg/dL (ref 0.61–1.24)
GFR calc Af Amer: >60 mL/min (ref >60)
GFR calc non Af Amer: >60 mL/min (ref >60)
Glucose, Bld: 275 mg/dL — ABNORMAL HIGH (ref 70–99)
Potassium: 4.6 mmol/L (ref 3.5–5.1)
Sodium: 130 mmol/L — ABNORMAL LOW (ref 135–145)
Total Bilirubin: 0.6 mg/dL (ref 0.3–1.2)
Total Protein: 7.3 g/dL (ref 6.5–8.1)

## 2018-12-15 LAB — SEDIMENTATION RATE: Sed Rate: 38 mm/hr — ABNORMAL HIGH (ref 0–16)

## 2018-12-15 LAB — HEMOGLOBIN A1C
Hgb A1c MFr Bld: 10.9 % — ABNORMAL HIGH (ref 4.8–5.6)
Mean Plasma Glucose: 266.13 mg/dL

## 2018-12-15 LAB — POC SARS CORONAVIRUS 2 AG -  ED: SARS Coronavirus 2 Ag: NEGATIVE

## 2018-12-15 LAB — C-REACTIVE PROTEIN: CRP: 1 mg/dL — ABNORMAL HIGH (ref <1.0)

## 2018-12-15 LAB — LACTIC ACID, PLASMA: Lactic Acid, Venous: 1.6 mmol/L (ref 0.5–1.9)

## 2018-12-15 LAB — GLUCOSE, CAPILLARY: Glucose-Capillary: 332 mg/dL — ABNORMAL HIGH (ref 70–99)

## 2018-12-15 MED ORDER — POLYETHYLENE GLYCOL 3350 17 G PO PACK: 17.0000 g | PACK | Freq: Every day | ORAL | Status: DC | PRN

## 2018-12-15 MED ORDER — ONDANSETRON HCL 4 MG/2ML IJ SOLN: 4.0000 mg | Freq: Four times a day (QID) | INTRAMUSCULAR | Status: DC | PRN

## 2018-12-15 MED ORDER — INSULIN ASPART 100 UNIT/ML ~~LOC~~ SOLN
0.0000 [IU] | SUBCUTANEOUS | Status: DC
  Administered 2018-12-15: 7 [IU] via SUBCUTANEOUS
  Administered 2018-12-16: 5 [IU] via SUBCUTANEOUS
  Administered 2018-12-16 (×2): 3 [IU] via SUBCUTANEOUS
  Administered 2018-12-16: 7 [IU] via SUBCUTANEOUS
  Administered 2018-12-16: 2 [IU] via SUBCUTANEOUS
  Administered 2018-12-17: 17:00:00 5 [IU] via SUBCUTANEOUS
  Administered 2018-12-17 (×2): 2 [IU] via SUBCUTANEOUS
  Administered 2018-12-17: 12:00:00 7 [IU] via SUBCUTANEOUS
  Administered 2018-12-17: 01:00:00 1 [IU] via SUBCUTANEOUS
  Administered 2018-12-17: 21:00:00 7 [IU] via SUBCUTANEOUS
  Administered 2018-12-18: 05:00:00 1 [IU] via SUBCUTANEOUS
  Administered 2018-12-18: 5 [IU] via SUBCUTANEOUS
  Administered 2018-12-18: 3 [IU] via SUBCUTANEOUS
  Administered 2018-12-18 – 2018-12-19 (×5): 2 [IU] via SUBCUTANEOUS
  Administered 2018-12-19: 12:00:00 5 [IU] via SUBCUTANEOUS
  Administered 2018-12-19: 2 [IU] via SUBCUTANEOUS

## 2018-12-15 MED ORDER — ACETAMINOPHEN 325 MG PO TABS: 650.0000 mg | ORAL_TABLET | Freq: Four times a day (QID) | ORAL | Status: DC | PRN

## 2018-12-15 MED ORDER — ACETAMINOPHEN 650 MG RE SUPP: 650.0000 mg | Freq: Four times a day (QID) | RECTAL | Status: DC | PRN

## 2018-12-15 MED ORDER — ENTRESTO 24-26 MG PO TABS: 1.0000 | ORAL_TABLET | Freq: Two times a day (BID) | ORAL | 11 refills | Status: DC

## 2018-12-15 MED ORDER — ONDANSETRON HCL 4 MG PO TABS: 4.0000 mg | ORAL_TABLET | Freq: Four times a day (QID) | ORAL | Status: DC | PRN

## 2018-12-15 MED ORDER — VANCOMYCIN HCL IN DEXTROSE 1-5 GM/200ML-% IV SOLN
1000.0000 mg | INTRAVENOUS | Status: DC
  Administered 2018-12-15: 1000 mg via INTRAVENOUS
  Filled 2018-12-15: qty 200

## 2018-12-15 NOTE — ED Provider Notes (Signed)
HPI  SUBJECTIVE:  Fernando Williams is a 58 y.o. male who presents with right third toe swelling for the past several weeks.  States that he cut his nails and injured the skin.  He states that he "kicked something" 5 days ago.  He reports worsening swelling.   He reports serosanguineous drainage, erythema. Not sure if his toe had purplish discoloration prior to kicking the object.  He is able to move his toe.  He denies pain, fevers, body aches, purulent drainage, odor, warmth.  He tried keeping it clean with hydrogen peroxide without improvement of symptoms.  No aggravating factors.  Denies erythema streaking up his foot.  States it is not getting worse, but that he was sent in by family member for antibiotics.  Past medical history of congestive heart failure with an EF of 17%, chronic kidney disease stage II, diabetes, he is a former smoker.  Denies neuropathy.  [id number], Percell Miller, MD  Past Medical History:  Diagnosis Date   CHF (congestive heart failure) ([place])    Pneumonia     Past Surgical History:  Procedure Laterality Date   FRACTURE SURGERY     RIGHT/LEFT HEART CATH AND CORONARY ANGIOGRAPHY N/A 07/21/2018   Procedure: RIGHT/LEFT HEART CATH AND CORONARY ANGIOGRAPHY;  Surgeon: Jolaine Artist, MD;  Location: [facility];  Service: Cardiovascular;  Laterality: N/A;    Family History  Problem Relation Age of Onset   Diabetes Mother    Cancer Father     Social History   Tobacco Use   Smoking status: Former Smoker    Types: Cigars    Quit date: 04/07/2018    Years since quitting: 0.6   Smokeless tobacco: Never Used  Substance Use Topics   Alcohol use: No   Drug use: No    No current facility-administered medications for this encounter.   Current Outpatient Medications:    albuterol (PROVENTIL HFA;VENTOLIN HFA) 108 (90 Base) MCG/ACT inhaler, Inhale 2 puffs into the lungs 3 (three) times daily. (Patient taking differently: Inhale 2 puffs into the lungs every  6 (six) hours as needed for wheezing or shortness of breath. ), Disp: 1 Inhaler, Rfl: 1   aspirin EC 81 MG EC tablet, Take 1 tablet (81 mg total) by mouth daily., Disp: 30 tablet, Rfl: 6   atorvastatin (LIPITOR) 20 MG tablet, Take 1 tablet (20 mg total) by mouth daily at 6 PM., Disp: 30 tablet, Rfl: 6   B Complex-C-Folic Acid (SUPER B COMPLEX/FA/VIT C PO), Take by mouth., Disp: , Rfl:    carvedilol (COREG) 6.25 MG tablet, Take 1 tablet (6.25 mg total) by mouth 2 (two) times daily with a meal., Disp: 180 tablet, Rfl: 3   digoxin (LANOXIN) 0.125 MG tablet, Take 1 tablet (0.125 mg total) by mouth daily., Disp: 30 tablet, Rfl: 6   furosemide (LASIX) 40 MG tablet, Take 1 tablet (40 mg total) by mouth daily., Disp: 90 tablet, Rfl: 3   glucosamine-chondroitin 500-400 MG tablet, Take 1 tablet by mouth 3 (three) times daily., Disp: , Rfl:    Magnesium 250 MG TABS, Take by mouth., Disp: , Rfl:    Multiple Vitamin (MULTIVITAMIN) tablet, Take 1 tablet by mouth daily., Disp: , Rfl:    potassium chloride (KLOR-CON) 20 MEQ packet, Take by mouth once., Disp: , Rfl:    sacubitril-valsartan (ENTRESTO) 24-26 MG, Take 1 tablet by mouth 2 (two) times daily., Disp: 60 tablet, Rfl: 11   spironolactone (ALDACTONE) 25 MG tablet, Take 1 tablet (25  mg total) by mouth daily., Disp: 30 tablet, Rfl: 6   vitamin B-12 (CYANOCOBALAMIN) 500 MCG tablet, Take 1,000 mcg by mouth daily., Disp: , Rfl:   Allergies  Allergen Reactions   Erythromycin Nausea And Vomiting    "Oxygen level went down"   Penicillins Other (See Comments)    Did it involve swelling of the face/tongue/throat, SOB, or low BP? No Did it involve sudden or severe rash/hives, skin peeling, or any reaction on the inside of your mouth or nose? Unknown Did you need to seek medical attention at a hospital or doctor's office? Unknown When did it last happen?childhood If all above answers are "NO", may proceed with cephalosporin use.  "MAKES ME  DEATHLY SICK" - STATES THIS OCCURS WITH ALL  "[name]"     ROS  As noted in HPI.   Physical Exam  BP (!) 152/82    Pulse 75    Temp 99.8 F (37.7 C)    Resp 20    SpO2 97%   Constitutional: Well developed, well nourished, no acute distress Eyes:  EOMI, conjunctiva normal bilaterally HENT: Normocephalic, atraumatic,mucus membranes moist Respiratory: Normal inspiratory effort Cardiovascular: Normal rate GI: nondistended skin: No rash, skin intact Musculoskeletal: Purpleish discoloration of entire right third toe with expressible serous drainage.  Skin peeling.  Positive swelling.  Positive odor.  Large ulcer medial toe.  No tenderness over entire toe.  No appreciable crepitus.  Patient able to move the toe.  Sensation to light touch grossly intact in all toes.  No erythema streaking up foot.         Neurologic: Alert & oriented x 3, no focal neuro deficits Psychiatric: Speech and behavior appropriate   ED Course   Medications - No data to display  No orders of the defined types were placed in this encounter.   No results found for this or any previous visit (from the past 24 hour(s)). No results found.  ED Clinical Impression  1. Diabetic infection of right foot [facility])      ED Assessment/Plan  Concern for open fracture, severe infection that would require IV antibiotics, early gangrene.  Transferring to the emergency department for further evaluation.  Labs and imaging are not available in this urgent care today.  Feel the patient is stable to go by private vehicle.  Discussed medical decision-making, rationale for transfer to the emergency department.  Patient agrees with plan.   No orders of the defined types were placed in this encounter.   *This clinic note was created using Dragon dictation software. Therefore, there may be occasional mistakes despite careful proofreading.   ?    Melynda Ripple, MD 12/15/18 1048

## 2018-12-15 NOTE — ED Provider Notes (Signed)
[facility] EMERGENCY DEPARTMENT Provider Note   CSN: [id number] Arrival date & time: 12/15/18  1102     History   Chief Complaint Chief Complaint  Patient presents with   Toe Pain    HPI Fernando Williams is a 58 y.o. male sent to the emergency department for evaluation of foot infection.  Patient reports that he has had numbness and tingling and intermittent peripheral edema ever since he had pneumonia which "infected my heart".  He is diagnosed with CHF at that time.  He states that he is borderline diabetic.  Patient wears steel toed boots at work.  5 days ago when he got home he noticed there was fluid in his shoe and thought that he might of kicked something because his middle toe was swollen.  He states that he knows it has been somewhat swollen for several days before however after that it was red.  He denies any pain in the toe however it has gotten progressively more erythematous, and swollen.  He has noticed associated purulent drainage.  Nothing seems to make his symptoms worse or better.  He was seen earlier today at [facility] urgent care and sent here for further evaluation secondary to inability to image the foot.  Patient has a history of tobacco abuse but quit in March of this year.     HPI  Past Medical History:  Diagnosis Date   CHF (congestive heart failure) ([place])    Pneumonia     Patient Active Problem List   Diagnosis Date Noted   CHF exacerbation ([place]) 07/19/2018   CHF (congestive heart failure) ([place]) 07/18/2018   Community acquired pneumonia    Acute respiratory failure with hypoxia ([place]) 04/09/2018   Interstitial pneumonia ([place]) 04/09/2018   Tobacco abuse 04/09/2018   Thrombocytopenia ([place]) 04/09/2018   Hyperkalemia 04/09/2018   SPRAIN&STRAIN OTH SPEC SITES SHOULDER&UPPER ARM 07/12/2008    Past Surgical History:  Procedure Laterality Date   FRACTURE SURGERY     RIGHT/LEFT HEART CATH AND CORONARY ANGIOGRAPHY N/A 07/21/2018   Procedure:  RIGHT/LEFT HEART CATH AND CORONARY ANGIOGRAPHY;  Surgeon: Jolaine Artist, MD;  Location: [facility];  Service: Cardiovascular;  Laterality: N/A;        Home Medications    Prior to Admission medications   Medication Sig Start Date End Date Taking? Authorizing Provider  albuterol (PROVENTIL HFA;VENTOLIN HFA) 108 (90 Base) MCG/ACT inhaler Inhale 2 puffs into the lungs 3 (three) times daily. Patient taking differently: Inhale 2 puffs into the lungs every 6 (six) hours as needed for wheezing or shortness of breath.  04/12/18   Georgette Shell, MD  aspirin EC 81 MG EC tablet Take 1 tablet (81 mg total) by mouth daily. 07/24/18   Clegg, Amy D, NP  atorvastatin (LIPITOR) 20 MG tablet Take 1 tablet (20 mg total) by mouth daily at 6 PM. 07/23/18   Clegg, Amy D, NP  B Complex-C-Folic Acid (SUPER B COMPLEX/FA/VIT C PO) Take by mouth.    [provider]  carvedilol (COREG) 6.25 MG tablet Take 1 tablet (6.25 mg total) by mouth 2 (two) times daily with a meal. 09/23/18 12/22/18  Clegg, Amy D, NP  digoxin (LANOXIN) 0.125 MG tablet Take 1 tablet (0.125 mg total) by mouth daily. 07/24/18   Clegg, Amy D, NP  furosemide (LASIX) 40 MG tablet Take 1 tablet (40 mg total) by mouth daily. 08/12/18   Clegg, Amy D, NP  glucosamine-chondroitin 500-400 MG tablet Take 1 tablet by  mouth 3 (three) times daily.    [provider]  losartan (COZAAR) 25 MG tablet Take 25 mg by mouth at bedtime. 11/14/18   [provider]  Magnesium 250 MG TABS Take by mouth.    [provider]  Multiple Vitamin (MULTIVITAMIN) tablet Take 1 tablet by mouth daily.    [provider]  potassium chloride SA (KLOR-CON) 20 MEQ tablet Take 20 mEq by mouth daily. 10/31/18   [provider]  sacubitril-valsartan (ENTRESTO) 24-26 MG Take 1 tablet by mouth 2 (two) times daily. 12/15/18   Bensimhon, Shaune Pascal, MD  spironolactone (ALDACTONE) 25 MG tablet Take 1 tablet (25 mg total) by mouth  daily. 07/24/18   Clegg, Amy D, NP  vitamin B-12 (CYANOCOBALAMIN) 500 MCG tablet Take 1,000 mcg by mouth daily.    [provider]    Family History Family History  Problem Relation Age of Onset   Diabetes Mother    Cancer Father     Social History Social History   Tobacco Use   Smoking status: Former Smoker    Types: Cigars    Quit date: 04/07/2018    Years since quitting: 0.6   Smokeless tobacco: Never Used  Substance Use Topics   Alcohol use: No   Drug use: No     Allergies   Erythromycin and Penicillins   Review of Systems Review of Systems  Constitutional: Negative for chills and fever.  HENT: Negative.   Respiratory: Negative.   Cardiovascular: Negative for chest pain and leg swelling.  Gastrointestinal: Negative.   Endocrine: Negative.   Genitourinary: Negative.   Musculoskeletal: Negative for gait problem.  Skin: Positive for color change and wound.  Neurological: Positive for numbness.  All other systems reviewed and are negative.    Physical Exam Updated Vital Signs BP (!) 159/94 (BP Location: Right Arm)    Pulse 79    Temp 97.9 F (36.6 C) (Oral)    Resp 18    Ht 5\' 9"  (1.753 m)    Wt 97.7 kg    SpO2 100%    BMI 31.81 kg/m   Physical Exam Vitals signs and nursing note reviewed.  Constitutional:      General: He is not in acute distress.    Appearance: He is well-developed. He is not diaphoretic.  HENT:     Head: Normocephalic and atraumatic.  Eyes:     General: No scleral icterus.    Conjunctiva/sclera: Conjunctivae normal.  Neck:     Musculoskeletal: Normal range of motion and neck supple.  Cardiovascular:     Rate and Rhythm: Normal rate and regular rhythm.     Heart sounds: Normal heart sounds.  Pulmonary:     Effort: Pulmonary effort is normal. No respiratory distress.     Breath sounds: Normal breath sounds.  Abdominal:     Palpations: Abdomen is soft.     Tenderness: There is no abdominal tenderness.  Skin:     General: Skin is warm and dry.     Comments: Erythematous discoloration and swelling  of entire right third toe with serous and purulent drainag. Malodorous. Desquamation of tissue to the PIP. Large ulcer medial toe.  No tenderness over entire toe.  No appreciable crepitus.  Patient able to move the toe.  Sensation to light touch grossly intact in all toes.  No erythema streaking up foot. DP/Pt pulse 2+  Neurological:     Mental Status: He is alert.  Psychiatric:  Behavior: Behavior normal.            ED Treatments / Results  Labs (all labs ordered are listed, but only abnormal results are displayed) Labs Reviewed  CULTURE, BLOOD (ROUTINE X 2)  CULTURE, BLOOD (ROUTINE X 2)  COMPREHENSIVE METABOLIC PANEL  CBC WITH DIFFERENTIAL/PLATELET  LACTIC ACID, PLASMA  CBG MONITORING, ED  POC SARS CORONAVIRUS 2 AG -  ED    EKG None  Radiology Dg Foot Complete Right  Result Date: 12/15/2018 CLINICAL DATA:  Swelling of right middle toe EXAM: RIGHT FOOT COMPLETE - 3+ VIEW COMPARISON:  None. FINDINGS: There is swelling of the right third digit. The third distal phalanx is eroded. The DIP joint appears preserved. No additional significant osseous abnormality. IMPRESSION: Right third digit soft tissue swelling with erosion of the distal phalanx suspicious for osteomyelitis. Electronically Signed   By: Macy Mis M.D.   On: 12/15/2018 11:49    Procedures Procedures (including critical care time)  Medications Ordered in ED Medications  vancomycin (VANCOCIN) IVPB 1000 mg/200 mL premix (has no administration in time range)     Initial Impression / Assessment and Plan / ED Course  I have reviewed the triage vital signs and the nursing notes.  Pertinent labs & imaging results that were available during my care of the patient were reviewed by me and considered in my medical decision making (see chart for details).  Clinical Course as of Dec 14 2140  Mon Dec 15, 2018  1500  WBC(!): 12.1 [AH]  1544 Sodium(!): 130 [AH]  1544 Glucose(!): 275 [AH]    Clinical Course User Index [AH] Margarita Mail, PA-C       CC:toe infection VS:  Vitals:   12/15/18 1111 12/15/18 2026 12/15/18 2125  BP: (!) 159/94 112/63 (!) 144/81  Pulse: 79 75 72  Resp: 18 16 20   Temp: 97.9 F (36.6 C)  98.6 F (37 C)  TempSrc: Oral  Oral  SpO2: 100% 100% 100%  Weight: 97.7 kg    Height: 5\' 9"  (1.753 m)     [id number] is gathered by patient  and EMR. DDX: Cellulitis, trauma, Osteomyelitis, gangrene Labs: I reviewed the labs which show BC with leukocytosis.  CMP shows elevated glucose level 275 with associated hyponatremia.  Covid test is negative.   Imaging: I personally reviewed the images (Complete right foot x-ray) which show(s) swelling of the right third toe with erosion of the distal phalanx consistent with osteomyelitis EKG: Not applicable MDM: Patient with infection of the foot.  High suspicion for underlying diabetes and peripheral neuropathy.  Patient has obvious osteomyelitis with bony erosion of the distal phalanx and infection of the toe.  I started the patient on IV vancomycin.  Blood cultures drawn.  He will be admitted to the hospitalist service.  I spoke with Dr. Arnoldo Morale who will round on the patient tomorrow for general surgery. Patient disposition: Admit Patient condition: Good. The patient appears reasonably stabilized for admission considering the current resources, flow, and capabilities available in the ED at this time, and I doubt any other [facility] requiring further screening and/or treatment in the ED prior to admission.   Final Clinical Impressions(s) / ED Diagnoses   Final diagnoses:  None    ED Discharge Orders    None       Margarita Mail, PA-C 12/15/18 2147    Fredia Sorrow, MD 12/17/18 [phone]

## 2018-12-15 NOTE — ED Triage Notes (Signed)
Pt presents with right third toe swelling, toe is red and swollen, pt has been having swelling but stubbed his toe last Wednesday last week and now skin is open and draining

## 2018-12-15 NOTE — ED Notes (Signed)
Denies any pain in toe.

## 2018-12-15 NOTE — ED Notes (Signed)
Ortho coverage paged again at this time for consult.

## 2018-12-15 NOTE — ED Triage Notes (Signed)
Here from urgent care for evaluation of right third toe swelling with open/draining wound.

## 2018-12-15 NOTE — Telephone Encounter (Signed)
Patient Advocate Encounter   Received notification from [place] that prior authorization for Delene Loll is required.   PA submitted on CoverMyMeds Key [id number] Status is pending   Will continue to follow.  Charlann Boxer, CPhT

## 2018-12-15 NOTE — ED Notes (Signed)
Right middle toe with swelling and drainage

## 2018-12-15 NOTE — Discharge Instructions (Addendum)
I am concerned that you have a severe infection that would require IV antibiotics 6or an open fracture.  We need more information to make sure that you are okay for outpatient treatment.  Go to the [facility] emergency department.

## 2018-12-15 NOTE — H&P (Addendum)
History and Physical    Fernando Williams YTK:[id number] DOB: 26-Mar-1960 DOA: 12/15/2018  PCP: Sinda Du, MD   Patient coming from: Home  I have personally briefly reviewed patient's old medical records in [place]  Chief Complaint: Right 3rd toe wound  HPI: Fernando Williams is a 59 y.o. male with medical history significant for  CHF, DM2, presented to the ED with c/o of right 3rd toe wound with drainage.  Patient reports that about 3 to 4 weeks ago, he was clipping his toenails when he suddenly started bleeding from the right third toe.  The wound formed a scab.  About a week ago he noticed swelling involving the third toe and thought it was related to his heart failure.  But on Wednesday 11/25, he stubbed his foot at work, and a wound opened up and started draining. He denies fever or chills.  No vomiting or loose stools.  No lower extremity swelling, no difficulty breathing or chest pain.  Reports compliance with his medications which includes Entresto and Lasix.  He also reports he was supposed to be started on Metformin for his diabetes, but this is currently on hold due to cardiac issues.  ED Course: Temperature 97.9.  Stable vitals.  WBC 12.1.  Normal lactic acid 1.6.  X-ray of the right foot shows right third digit soft tissue swelling with erosion of the distal phalanx suspicious for osteomyelitis.  IV vancomycin x1 dose given in ED. EDP talked to Dr. Arnoldo Morale who will see in a.m.  Hospitalist admit for further evaluation and management.  Review of Systems: As per HPI all other systems reviewed and negative.  Past Medical History:  Diagnosis Date  . CHF (congestive heart failure) ([place])   . Pneumonia     Past Surgical History:  Procedure Laterality Date  . FRACTURE SURGERY    . RIGHT/LEFT HEART CATH AND CORONARY ANGIOGRAPHY N/A 07/21/2018   Procedure: RIGHT/LEFT HEART CATH AND CORONARY ANGIOGRAPHY;  Surgeon: Jolaine Artist, MD;  Location: [facility];  Service:  Cardiovascular;  Laterality: N/A;     reports that he quit smoking about 8 months ago. His smoking use included cigars. He has never used smokeless tobacco. He reports that he does not drink alcohol or use drugs.  Allergies  Allergen Reactions  . Erythromycin Nausea And Vomiting    "Oxygen level went down"  . Penicillins Other (See Comments)    Did it involve swelling of the face/tongue/throat, SOB, or low BP? No Did it involve sudden or severe rash/hives, skin peeling, or any reaction on the inside of your mouth or nose? Unknown Did you need to seek medical attention at a hospital or doctor's office? Unknown When did it last happen?childhood If all above answers are "NO", may proceed with cephalosporin use.  "[name]" - STATES THIS OCCURS WITH ALL  "[name]"    Family History  Problem Relation Age of Onset  . Diabetes Mother   . Cancer Father     Prior to Admission medications   Medication Sig Start Date End Date Taking? Authorizing Provider  albuterol (PROVENTIL HFA;VENTOLIN HFA) 108 (90 Base) MCG/ACT inhaler Inhale 2 puffs into the lungs 3 (three) times daily. Patient taking differently: Inhale 2 puffs into the lungs every 6 (six) hours as needed for wheezing or shortness of breath.  04/12/18   Georgette Shell, MD  aspirin EC 81 MG EC tablet Take 1 tablet (81 mg total) by mouth daily. 07/24/18  Clegg, Amy D, NP  atorvastatin (LIPITOR) 20 MG tablet Take 1 tablet (20 mg total) by mouth daily at 6 PM. 07/23/18   Clegg, Amy D, NP  B Complex-C-Folic Acid (SUPER B COMPLEX/FA/VIT C PO) Take by mouth.    [provider]  carvedilol (COREG) 6.25 MG tablet Take 1 tablet (6.25 mg total) by mouth 2 (two) times daily with a meal. 09/23/18 12/22/18  Clegg, Amy D, NP  digoxin (LANOXIN) 0.125 MG tablet Take 1 tablet (0.125 mg total) by mouth daily. 07/24/18   Clegg, Amy D, NP  furosemide (LASIX) 40 MG tablet Take 1 tablet (40 mg total) by mouth daily. 08/12/18   Clegg,  Amy D, NP  glucosamine-chondroitin 500-400 MG tablet Take 1 tablet by mouth 3 (three) times daily.    [provider]  losartan (COZAAR) 25 MG tablet Take 25 mg by mouth at bedtime. 11/14/18   [provider]  Magnesium 250 MG TABS Take by mouth.    [provider]  Multiple Vitamin (MULTIVITAMIN) tablet Take 1 tablet by mouth daily.    [provider]  potassium chloride SA (KLOR-CON) 20 MEQ tablet Take 20 mEq by mouth daily. 10/31/18   [provider]  sacubitril-valsartan (ENTRESTO) 24-26 MG Take 1 tablet by mouth 2 (two) times daily. 12/15/18   Bensimhon, Shaune Pascal, MD  spironolactone (ALDACTONE) 25 MG tablet Take 1 tablet (25 mg total) by mouth daily. 07/24/18   Clegg, Amy D, NP  vitamin B-12 (CYANOCOBALAMIN) 500 MCG tablet Take 1,000 mcg by mouth daily.    [provider]    Physical Exam: Vitals:   12/15/18 1111  BP: (!) 159/94  Pulse: 79  Resp: 18  Temp: 97.9 F (36.6 C)  TempSrc: Oral  SpO2: 100%  Weight: 97.7 kg  Height: 5' 9"  (1.753 m)    Constitutional: NAD, calm, comfortable Vitals:   12/15/18 1111  BP: (!) 159/94  Pulse: 79  Resp: 18  Temp: 97.9 F (36.6 C)  TempSrc: Oral  SpO2: 100%  Weight: 97.7 kg  Height: 5' 9"  (1.753 m)   Eyes: PERRL, lids and conjunctivae normal ENMT: Mucous membranes are moist. Posterior pharynx clear of any exudate or lesions.Normal dentition.  Neck: normal, supple, no masses, no thyromegaly Respiratory: clear to auscultation bilaterally, no wheezing, no crackles. Normal respiratory effort. No accessory muscle use.  Cardiovascular: Regular rate and rhythm, no murmurs / rubs / gallops. No extremity edema. 2+ pedal pulses.  DP pulse present.  Abdomen: no tenderness, no masses palpated. No hepatosplenomegaly. Bowel sounds positive.  Musculoskeletal: no clubbing / cyanosis. No joint deformity upper and lower extremities. Good ROM, no contractures. Normal muscle tone.  Skin: no rashes,  lesions, No induration.  Wound involving the third distal phalanx of right foot.  No surrounding erythema redness or streaking. Neurologic: CN 2-12 grossly intact.  Strength 5/5 in all 4.  Psychiatric: Normal judgment and insight. Alert and oriented x 3. Normal mood.   Photos taken earlier today at urgent care facility.          Labs on Admission: I have personally reviewed following labs and imaging studies  CBC: Recent Labs  Lab 12/15/18 1406  WBC 12.1*  NEUTROABS 8.4*  HGB 14.8  HCT 43.8  MCV 95.8  PLT 413   Basic Metabolic Panel: Recent Labs  Lab 12/15/18 1406  NA 130*  K 4.6  CL 96*  CO2 26  GLUCOSE 275*  BUN 20  CREATININE 0.85  CALCIUM 9.2  Liver Function Tests: Recent Labs  Lab 12/15/18 1406  AST 19  ALT 28  ALKPHOS 63  BILITOT 0.6  PROT 7.3  ALBUMIN 3.8    Radiological Exams on Admission: Dg Foot Complete Right  Result Date: 12/15/2018 CLINICAL DATA:  Swelling of right middle toe EXAM: RIGHT FOOT COMPLETE - 3+ VIEW COMPARISON:  None. FINDINGS: There is swelling of the right third digit. The third distal phalanx is eroded. The DIP joint appears preserved. No additional significant osseous abnormality. IMPRESSION: Right third digit soft tissue swelling with erosion of the distal phalanx suspicious for osteomyelitis. Electronically Signed   By: Macy Mis M.D.   On: 12/15/2018 11:49    EKG: None.  Assessment/Plan Active Problems:   Toe osteomyelitis, right (HCC)  Right 3rd toe osteomyelitis- x-ray shows reduction of distal phalanx suspicious for osteomyelitis.  Dorsalis pedis pulse present.  No findings to suggest cellulitis.  WBC 12.1.  Normal lactic acid 1.6.  Patient rules out for sepsis. -IV vancomycin x1 given in ED, will hold off on further antibiotics at this time, as he is well-appearing, not septic, without surrounding cellulitis.  To improve yield from cultures as patient will likely need prolonged course of IV antibiotics. - EDP  talked to Dr. Arnoldo Morale, he will be seen in a.m. -Obtain ESR, CRP -Right lower extremity at [facility] -Follow-up blood cultures obtained in ED  Systolic congestive heart failure-stable and compensated.  Admitted in March/2020 for interstitial pneumonia, respiratory panel positive for metapneumovirus.  Patient subsequently developed new onset congestive heart failure with EF of 15 - 20 %% by echo-thought secondary to viral myocarditis.  Cardiac cath 07/2018 showed mild nonobstructive coronary artery disease.  Per notes patient's declined LifeVest.  Echo 11/12/2018 shows EF of 25 to 30%. -Resume home Entresto, spironolactone -Resume home Lasix -Resume home carvedilol, statins -Hold aspirin for now pending surgery evaluation  Diabetes mellitus-random glucose 275.  Last hemoglobin A1c 7/20 was 8.6.  Plans to start oral hypoglycemic agents. -Hemoglobin A1c - SSI  DVT prophylaxis: SCDs Code Status: Full code Family Communication: Son at bedside Disposition Plan: > 2 days Consults called: General surgery Admission status: Inpatient, telemetry I certify that at the point of admission it is my clinical judgment that the patient will require inpatient hospital care spanning beyond 2 midnights from the point of admission due to high intensity of service, high risk for further deterioration and high frequency of surveillance required. The following factors support the patient status of inpatient: Osteomyelitis involving right 3rd toe, likely requiring prolonged course of IV antibiotics.   Bethena Roys MD [facility]  12/15/2018, 5:22 PM

## 2018-12-16 ENCOUNTER — Inpatient Hospital Stay (HOSPITAL_COMMUNITY): Payer: 59

## 2018-12-16 DIAGNOSIS — M869 Osteomyelitis, unspecified: Secondary | ICD-10-CM

## 2018-12-16 LAB — GLUCOSE, CAPILLARY
Glucose-Capillary: 225 mg/dL — ABNORMAL HIGH (ref 70–99)
Glucose-Capillary: 235 mg/dL — ABNORMAL HIGH (ref 70–99)
Glucose-Capillary: 309 mg/dL — ABNORMAL HIGH (ref 70–99)
Glucose-Capillary: 283 mg/dL — ABNORMAL HIGH (ref 70–99)

## 2018-12-16 MED ORDER — DIGOXIN 125 MCG PO TABS
0.1250 mg | ORAL_TABLET | Freq: Every day | ORAL | Status: DC
  Administered 2018-12-16 – 2018-12-19 (×3): 0.125 mg via ORAL
  Filled 2018-12-16 (×4): qty 1

## 2018-12-16 MED ORDER — MAGNESIUM OXIDE 400 (241.3 MG) MG PO TABS
200.0000 mg | ORAL_TABLET | Freq: Every evening | ORAL | Status: DC
  Administered 2018-12-16 – 2018-12-18 (×3): 200 mg via ORAL
  Filled 2018-12-16 (×3): qty 1

## 2018-12-16 MED ORDER — B COMPLEX-C PO TABS
1.0000 | ORAL_TABLET | Freq: Every day | ORAL | Status: DC
  Administered 2018-12-16 – 2018-12-19 (×4): 1 via ORAL
  Filled 2018-12-16 (×4): qty 1

## 2018-12-16 MED ORDER — SPIRONOLACTONE 25 MG PO TABS
25.0000 mg | ORAL_TABLET | Freq: Every day | ORAL | Status: DC
  Administered 2018-12-16 – 2018-12-19 (×4): 25 mg via ORAL
  Filled 2018-12-16 (×4): qty 1

## 2018-12-16 MED ORDER — METRONIDAZOLE IN NACL 5-0.79 MG/ML-% IV SOLN
500.0000 mg | Freq: Three times a day (TID) | INTRAVENOUS | Status: DC
  Administered 2018-12-16 – 2018-12-19 (×9): 500 mg via INTRAVENOUS
  Filled 2018-12-16 (×10): qty 100

## 2018-12-16 MED ORDER — ADULT MULTIVITAMIN W/MINERALS CH
1.0000 | ORAL_TABLET | Freq: Every evening | ORAL | Status: DC
  Administered 2018-12-16 – 2018-12-18 (×3): 1 via ORAL
  Filled 2018-12-16 (×3): qty 1

## 2018-12-16 MED ORDER — FUROSEMIDE 40 MG PO TABS
40.0000 mg | ORAL_TABLET | Freq: Every day | ORAL | Status: DC
  Administered 2018-12-16 – 2018-12-19 (×5): 40 mg via ORAL
  Filled 2018-12-16 (×4): qty 1

## 2018-12-16 MED ORDER — SACUBITRIL-VALSARTAN 24-26 MG PO TABS
1.0000 | ORAL_TABLET | Freq: Two times a day (BID) | ORAL | Status: DC
  Administered 2018-12-16 – 2018-12-19 (×8): 1 via ORAL
  Filled 2018-12-16 (×7): qty 1

## 2018-12-16 MED ORDER — INSULIN GLARGINE 100 UNIT/ML ~~LOC~~ SOLN
10.0000 [IU] | Freq: Every day | SUBCUTANEOUS | Status: DC
  Administered 2018-12-16 – 2018-12-19 (×4): 10 [IU] via SUBCUTANEOUS
  Filled 2018-12-16 (×7): qty 0.1

## 2018-12-16 MED ORDER — ALBUTEROL SULFATE (2.5 MG/3ML) 0.083% IN NEBU: 3.0000 mL | INHALATION_SOLUTION | Freq: Four times a day (QID) | RESPIRATORY_TRACT | Status: DC | PRN

## 2018-12-16 MED ORDER — SODIUM CHLORIDE 0.9 % IV SOLN
2.0000 g | Freq: Three times a day (TID) | INTRAVENOUS | Status: DC
  Administered 2018-12-16 – 2018-12-18 (×9): 2 g via INTRAVENOUS
  Filled 2018-12-16 (×10): qty 2

## 2018-12-16 MED ORDER — CARVEDILOL 3.125 MG PO TABS
6.2500 mg | ORAL_TABLET | Freq: Two times a day (BID) | ORAL | Status: DC
  Administered 2018-12-16 – 2018-12-19 (×7): 6.25 mg via ORAL
  Filled 2018-12-16 (×8): qty 2

## 2018-12-16 MED ORDER — ATORVASTATIN CALCIUM 20 MG PO TABS
20.0000 mg | ORAL_TABLET | Freq: Every day | ORAL | Status: DC
  Administered 2018-12-16 – 2018-12-18 (×3): 20 mg via ORAL
  Filled 2018-12-16 (×3): qty 1

## 2018-12-16 MED ORDER — ASPIRIN EC 81 MG PO TBEC
81.0000 mg | DELAYED_RELEASE_TABLET | Freq: Every day | ORAL | Status: DC
  Administered 2018-12-16 – 2018-12-19 (×5): 81 mg via ORAL
  Filled 2018-12-16 (×4): qty 1

## 2018-12-16 MED ORDER — POTASSIUM CHLORIDE CRYS ER 20 MEQ PO TBCR
20.0000 meq | EXTENDED_RELEASE_TABLET | Freq: Every day | ORAL | Status: DC
  Administered 2018-12-16 – 2018-12-19 (×5): 20 meq via ORAL
  Filled 2018-12-16 (×4): qty 1

## 2018-12-16 MED ORDER — GADOBUTROL 1 MMOL/ML IV SOLN
10.0000 mL | Freq: Once | INTRAVENOUS | Status: AC | PRN
  Administered 2018-12-16: 7 mL via INTRAVENOUS

## 2018-12-16 NOTE — Progress Notes (Signed)
Subjective: He was admitted yesterday with osteomyelitis of his foot.  He is diabetic.  He has heart failure.  I had started him on medications for his diabetes but apparently those have been on hold from his cardiologist and he has not followed up with me.  He has received vancomycin but is not on any antibiotics now.  He is not received his regular cardiac medications.  His blood sugar is elevated and A1c is elevated indicating that he does need treatment for his diabetes.  Objective: Vital signs in last 24 hours: Temp:  [97.9 F (36.6 C)-99.8 F (37.7 C)] 98.9 F (37.2 C) (12/01 0417) Pulse Rate:  [68-79] 68 (12/01 0417) Resp:  [16-20] 20 (12/01 0417) BP: (112-159)/(63-94) 118/69 (12/01 0417) SpO2:  [97 %-100 %] 100 % (12/01 0417) Weight:  [97.7 kg] 97.7 kg (11/30 1111) Weight change:     Intake/Output from previous day: No intake/output data recorded.  PHYSICAL EXAM General appearance: alert, cooperative and no distress Resp: clear to auscultation bilaterally Cardio: regular rate and rhythm, S1, S2 normal, no murmur, click, rub or gallop GI: soft, non-tender; bowel sounds normal; no masses,  no organomegaly Extremities: His third toe on his right foot still looks grossly infected and does drain some pus  Lab Results:  Results for orders placed or performed during the hospital encounter of 12/15/18 (from the past 48 hour(s))  Comprehensive metabolic panel     Status: Abnormal   Collection Time: 12/15/18  2:06 PM  Result Value Ref Range   Sodium 130 (L) 135 - 145 mmol/L   Potassium 4.6 3.5 - 5.1 mmol/L   Chloride 96 (L) 98 - 111 mmol/L   CO2 26 22 - 32 mmol/L   Glucose, Bld 275 (H) 70 - 99 mg/dL   BUN 20 6 - 20 mg/dL   Creatinine, Ser 0.85 0.61 - 1.24 mg/dL   Calcium 9.2 8.9 - 10.3 mg/dL   Total Protein 7.3 6.5 - 8.1 g/dL   Albumin 3.8 3.5 - 5.0 g/dL   AST 19 15 - 41 U/L   ALT 28 0 - 44 U/L   Alkaline Phosphatase 63 38 - 126 U/L   Total Bilirubin 0.6 0.3 - 1.2 mg/dL    GFR calc non Af Amer >60 >60 mL/min   GFR calc Af Amer >60 >60 mL/min   Anion gap 8 5 - 15    Comment: Performed at [facility], [address], [place], [place] [address]  CBC with Differential     Status: Abnormal   Collection Time: 12/15/18  2:06 PM  Result Value Ref Range   WBC 12.1 (H) 4.0 - 10.5 K/uL   RBC 4.57 4.22 - 5.81 MIL/uL   Hemoglobin 14.8 13.0 - 17.0 g/dL   HCT 43.8 39.0 - 52.0 %   MCV 95.8 80.0 - 100.0 fL   MCH 32.4 26.0 - 34.0 pg   MCHC 33.8 30.0 - 36.0 g/dL   RDW 11.4 (L) 11.5 - 15.5 %   Platelets 289 150 - 400 K/uL   nRBC 0.0 0.0 - 0.2 %   Neutrophils Relative % 70 %   Neutro Abs 8.4 (H) 1.7 - 7.7 K/uL   Lymphocytes Relative 20 %   Lymphs Abs 2.4 0.7 - 4.0 K/uL   Monocytes Relative 6 %   Monocytes Absolute 0.8 0.1 - 1.0 K/uL   Eosinophils Relative 2 %   Eosinophils Absolute 0.3 0.0 - 0.5 K/uL   Basophils Relative 1 %   Basophils Absolute  0.1 0.0 - 0.1 K/uL   Immature Granulocytes 1 %   Abs Immature Granulocytes 0.17 (H) 0.00 - 0.07 K/uL    Comment: Performed at [facility], [address], [place], [place] [address]  Lactic acid     Status: None   Collection Time: 12/15/18  2:06 PM  Result Value Ref Range   Lactic Acid, Venous 1.6 0.5 - 1.9 mmol/L    Comment: Performed at [facility], [address] [place], [place] [address]  Blood Cultures x 2 sites     Status: None (Preliminary result)   Collection Time: 12/15/18  2:06 PM   Specimen: BLOOD RIGHT ARM  Result Value Ref Range   Specimen Description BLOOD RIGHT ARM    Special Requests      BOTTLES DRAWN AEROBIC AND ANAEROBIC Blood Culture adequate volume   Culture      NO GROWTH < 24 HOURS Performed at [facility], [address] [place], [place] [address]    Report Status PENDING   Sedimentation rate     Status: Abnormal   Collection Time: 12/15/18  2:06 PM  Result Value Ref Range   Sed Rate 38 (H) 0 - 16 mm/hr    Comment: Performed at [facility], [address], [place], [place] [address]   C-reactive protein     Status: Abnormal   Collection Time: 12/15/18  2:06 PM  Result Value Ref Range   CRP 1.0 (H) <1.0 mg/dL    Comment: Performed at [facility], [address], [place], [place] [address]  Hemoglobin A1c     Status: Abnormal   Collection Time: 12/15/18  2:06 PM  Result Value Ref Range   Hgb A1c MFr Bld 10.9 (H) 4.8 - 5.6 %    Comment: (NOTE) Pre diabetes:          5.7%-6.4% Diabetes:              >6.4% Glycemic control for   <7.0% adults with diabetes    Mean Plasma Glucose 266.13 mg/dL    Comment: Performed at [facility] [address] [place], [place] [address]  Blood Cultures x 2 sites     Status: None (Preliminary result)   Collection Time: 12/15/18  2:17 PM   Specimen: BLOOD LEFT ARM  Result Value Ref Range   Specimen Description BLOOD LEFT ARM    Special Requests      BOTTLES DRAWN AEROBIC AND ANAEROBIC Blood Culture adequate volume   Culture      NO GROWTH < 24 HOURS Performed at [facility], [address] [place],  [address]    Report Status PENDING   POC SARS Coronavirus 2 Ag-ED - Nasal Swab (BD Veritor Kit)     Status: None   Collection Time: 12/15/18  4:10 PM  Result Value Ref Range   SARS Coronavirus 2 Ag NEGATIVE NEGATIVE    Comment: (NOTE) SARS-CoV-2 antigen NOT DETECTED.  Negative results are presumptive.  Negative results do not preclude SARS-CoV-2 infection and should not be used as the sole basis for treatment or other patient management decisions, including infection  control decisions, particularly in the presence of clinical signs and  symptoms consistent with COVID-19, or in those who have been in contact with the virus.  Negative results must be combined with clinical observations, patient history, and epidemiological information. The expected result is Negative. Fact Sheet for Patients: PodPark.tn Fact Sheet for Healthcare  Providers: GiftContent.is This test is not yet approved or  cleared by the [place] and  has been authorized for detection and/or diagnosis of SARS-CoV-2 by FDA under an Emergency Use Authorization (EUA).  This EUA will remain in effect (meaning this test can be used) for the duration of  the COVID-19 de claration under Section 564(b)(1) of the Act, 21 U.S.C. section 360bbb-3(b)(1), unless the authorization is terminated or revoked sooner.   Glucose, capillary     Status: Abnormal   Collection Time: 12/15/18 10:17 PM  Result Value Ref Range   Glucose-Capillary 332 (H) 70 - 99 mg/dL  Glucose, capillary     Status: Abnormal   Collection Time: 12/16/18  4:13 AM  Result Value Ref Range   Glucose-Capillary 225 (H) 70 - 99 mg/dL  Glucose, capillary     Status: Abnormal   Collection Time: 12/16/18  7:40 AM  Result Value Ref Range   Glucose-Capillary 235 (H) 70 - 99 mg/dL    ABGS No results for input(s): PHART, PO2ART, TCO2, HCO3 in the last 72 hours.  Invalid input(s): PCO2 CULTURES Recent Results (from the past 240 hour(s))  Blood Cultures x 2 sites     Status: None (Preliminary result)   Collection Time: 12/15/18  2:06 PM   Specimen: BLOOD RIGHT ARM  Result Value Ref Range Status   Specimen Description BLOOD RIGHT ARM  Final   Special Requests   Final    BOTTLES DRAWN AEROBIC AND ANAEROBIC Blood Culture adequate volume   Culture   Final    NO GROWTH < 24 HOURS Performed at [facility] [address] [place], [place] [address]    Report Status PENDING  Incomplete  Blood Cultures x 2 sites     Status: None (Preliminary result)   Collection Time: 12/15/18  2:17 PM   Specimen: BLOOD LEFT ARM  Result Value Ref Range Status   Specimen Description BLOOD LEFT ARM  Final   Special Requests   Final    BOTTLES DRAWN AEROBIC AND ANAEROBIC Blood Culture adequate volume   Culture   Final    NO GROWTH < 24 HOURS Performed at [facility],  [address] [place], [place] [address]    Report Status PENDING  Incomplete   Studies/Results: Dg Foot Complete Right  Result Date: 12/15/2018 CLINICAL DATA:  Swelling of right middle toe EXAM: RIGHT FOOT COMPLETE - 3+ VIEW COMPARISON:  None. FINDINGS: There is swelling of the right third digit. The third distal phalanx is eroded. The DIP joint appears preserved. No additional significant osseous abnormality. IMPRESSION: Right third digit soft tissue swelling with erosion of the distal phalanx suspicious for osteomyelitis. Electronically Signed   By: Macy Mis M.D.   On: 12/15/2018 11:49    Medications:  Prior to Admission:  Medications Prior to Admission  Medication Sig Dispense Refill Last Dose  . albuterol (PROVENTIL HFA;VENTOLIN HFA) 108 (90 Base) MCG/ACT inhaler Inhale 2 puffs into the lungs 3 (three) times daily. (Patient taking differently: Inhale 2 puffs into the lungs every 6 (six) hours as needed for wheezing or shortness of breath. ) 1 Inhaler 1 unknown  . aspirin EC 81 MG EC tablet Take 1 tablet (81 mg total) by mouth daily. 30 tablet 6 12/15/2018 at Unknown time  . atorvastatin (LIPITOR) 20 MG tablet Take 1 tablet (20 mg total) by mouth daily at 6 PM. 30 tablet 6 12/14/2018 at Unknown time  . B Complex-C-Folic Acid (SUPER B COMPLEX/FA/VIT C PO) Take 1 tablet by mouth daily.    12/14/2018 at  Unknown time  . carvedilol (COREG) 6.25 MG tablet Take 1 tablet (6.25 mg total) by mouth 2 (two) times daily with a meal. 180 tablet 3 12/15/2018 at 643  . digoxin (LANOXIN) 0.125 MG tablet Take 1 tablet (0.125 mg total) by mouth daily. 30 tablet 6 12/15/2018 at Unknown time  . furosemide (LASIX) 40 MG tablet Take 1 tablet (40 mg total) by mouth daily. 90 tablet 3 12/14/2018 at Unknown time  . glucosamine-chondroitin 500-400 MG tablet Take 2 tablets by mouth daily.    12/14/2018 at Unknown time  . Magnesium 250 MG TABS Take 1 tablet by mouth every evening.    12/14/2018 at Unknown time  .  Multiple Vitamin (MULTIVITAMIN) tablet Take 1 tablet by mouth every evening.    12/14/2018 at Unknown time  . potassium chloride SA (KLOR-CON) 20 MEQ tablet Take 20 mEq by mouth daily.   12/15/2018 at Unknown time  . sacubitril-valsartan (ENTRESTO) 24-26 MG Take 1 tablet by mouth 2 (two) times daily. 60 tablet 11 12/15/2018 at Unknown time  . spironolactone (ALDACTONE) 25 MG tablet Take 1 tablet (25 mg total) by mouth daily. 30 tablet 6 12/14/2018 at Unknown time  . vitamin B-12 (CYANOCOBALAMIN) 500 MCG tablet Take 1,000 mcg by mouth daily.   12/14/2018 at Unknown time   Scheduled: . aspirin EC  81 mg Oral Daily  . atorvastatin  20 mg Oral q1800  . carvedilol  6.25 mg Oral BID WC  . digoxin  0.125 mg Oral Daily  . furosemide  40 mg Oral Daily  . insulin aspart  0-9 Units Subcutaneous Q4H  . insulin glargine  10 Units Subcutaneous Daily  . Magnesium  1 tablet Oral QPM  . multivitamin with minerals  1 tablet Oral QPM  . potassium chloride SA  20 mEq Oral Daily  . sacubitril-valsartan  1 tablet Oral BID  . spironolactone  25 mg Oral Daily  . Super B Complex/FA/Vit C   Oral Daily   Continuous:  SHF:WYOVZCHYIFOYD **OR** acetaminophen, albuterol, ondansetron **OR** ondansetron (ZOFRAN) IV, polyethylene glycol  Assesment: He has osteomyelitis of his right middle toe.  He does not apparently need surgery right now per Dr. Arnoldo Morale who I discussed his case with.  He is draining some purulent material so I am going to culture that even though he is received a dose of vancomycin  He has diabetes and is on sliding scale and I added Lantus his blood sugar is not controlled because he is not been taking any of his medications  He has heart failure which is better but still with ejection fraction in the 20s Active Problems:   Toe osteomyelitis, right ([place])    Plan: MRI of the foot to get a better idea of extent of his osteomyelitis.  He may ultimately come to amputation.  Meantime start Zosyn.   Restart cardiac medications    LOS: 1 day   Alonza Bogus 12/16/2018, 8:26 AM

## 2018-12-16 NOTE — Progress Notes (Signed)
Pharmacy Antibiotic Note  Fernando Williams is a 58 y.o. male admitted on 12/15/2018 with wound infection.  Pharmacy has been consulted for Cefepime dosing.  Plan: Cefepime 2gm IV q8h Flagyl 500mg  IV q8h per MD F/U cxs and clinical progress Monitor V/S, labs  Height: 5\' 9"  (175.3 cm) Weight: 215 lb 6.2 oz (97.7 kg) IBW/kg (Calculated) : 70.7  Temp (24hrs), Avg:98.8 F (37.1 C), Min:97.9 F (36.6 C), Max:99.8 F (37.7 C)  Recent Labs  Lab 12/15/18 1406  WBC 12.1*  CREATININE 0.85  LATICACIDVEN 1.6    Estimated Creatinine Clearance: 107.9 mL/min (by C-G formula based on SCr of 0.85 mg/dL).    Allergies  Allergen Reactions  . Erythromycin Nausea And Vomiting    "Oxygen level went down"  . Penicillins Other (See Comments)    Did it involve swelling of the face/tongue/throat, SOB, or low BP? No Did it involve sudden or severe rash/hives, skin peeling, or any reaction on the inside of your mouth or nose? Unknown Did you need to seek medical attention at a hospital or doctor's office? Unknown When did it last happen?childhood If all above answers are "NO", may proceed with cephalosporin use.  "[name]" - STATES THIS OCCURS WITH ALL  "[name]"    Antimicrobials this admission: Cefepime 12/1 >>  Flagyl 12/1 >>  Vancomycin x 1 dose in ED 11/30  Dose adjustments this admission:   Microbiology results: No cultures  Thank you for allowing pharmacy to be a part of this patient's care.  Isac Sarna, BS Pharm D, [place] Clinical Pharmacist Pager [phone] 12/16/2018 8:44 AM

## 2018-12-16 NOTE — Consult Note (Signed)
Reason for Consult: Right third toe cellulitis Referring Physician: Dr. Raul Del is an 58 y.o. male.  HPI: Patient is a 58 year old white male who presented to the emergency room with drainage from his right third toe.  He states that approximately 1 month ago, he clipped his toenail and probably caused irritation along the distal toe tuft.  He had been pouring peroxide on his toe intermittently.  He then hit his toe when he was kicking something at work several days ago and noticed more drainage from the toe.  He denies any fevers.  His past medical history is significant for congestive heart failure and newly diagnosed diabetes mellitus.  He currently has 0 out of 10 pain at the toe.  Past Medical History:  Diagnosis Date  . CHF (congestive heart failure) ([place])   . Pneumonia     Past Surgical History:  Procedure Laterality Date  . FRACTURE SURGERY    . RIGHT/LEFT HEART CATH AND CORONARY ANGIOGRAPHY N/A 07/21/2018   Procedure: RIGHT/LEFT HEART CATH AND CORONARY ANGIOGRAPHY;  Surgeon: Jolaine Artist, MD;  Location: [facility];  Service: Cardiovascular;  Laterality: N/A;    Family History  Problem Relation Age of Onset  . Diabetes Mother   . Cancer Father     Social History:  reports that he quit smoking about 8 months ago. His smoking use included cigars. He has never used smokeless tobacco. He reports that he does not drink alcohol or use drugs.  Allergies:  Allergies  Allergen Reactions  . Erythromycin Nausea And Vomiting    "Oxygen level went down"  . Penicillins Other (See Comments)    Did it involve swelling of the face/tongue/throat, SOB, or low BP? No Did it involve sudden or severe rash/hives, skin peeling, or any reaction on the inside of your mouth or nose? Unknown Did you need to seek medical attention at a hospital or doctor's office? Unknown When did it last happen?childhood If all above answers are "NO", may proceed with cephalosporin  use.  "[name]" - STATES THIS OCCURS WITH ALL  "[name]"    Medications:  Prior to Admission:  Medications Prior to Admission  Medication Sig Dispense Refill Last Dose  . albuterol (PROVENTIL HFA;VENTOLIN HFA) 108 (90 Base) MCG/ACT inhaler Inhale 2 puffs into the lungs 3 (three) times daily. (Patient taking differently: Inhale 2 puffs into the lungs every 6 (six) hours as needed for wheezing or shortness of breath. ) 1 Inhaler 1 unknown  . aspirin EC 81 MG EC tablet Take 1 tablet (81 mg total) by mouth daily. 30 tablet 6 12/15/2018 at Unknown time  . atorvastatin (LIPITOR) 20 MG tablet Take 1 tablet (20 mg total) by mouth daily at 6 PM. 30 tablet 6 12/14/2018 at Unknown time  . B Complex-C-Folic Acid (SUPER B COMPLEX/FA/VIT C PO) Take 1 tablet by mouth daily.    12/14/2018 at Unknown time  . carvedilol (COREG) 6.25 MG tablet Take 1 tablet (6.25 mg total) by mouth 2 (two) times daily with a meal. 180 tablet 3 12/15/2018 at 643  . digoxin (LANOXIN) 0.125 MG tablet Take 1 tablet (0.125 mg total) by mouth daily. 30 tablet 6 12/15/2018 at Unknown time  . furosemide (LASIX) 40 MG tablet Take 1 tablet (40 mg total) by mouth daily. 90 tablet 3 12/14/2018 at Unknown time  . glucosamine-chondroitin 500-400 MG tablet Take 2 tablets by mouth daily.    12/14/2018 at Unknown time  . Magnesium  250 MG TABS Take 1 tablet by mouth every evening.    12/14/2018 at Unknown time  . Multiple Vitamin (MULTIVITAMIN) tablet Take 1 tablet by mouth every evening.    12/14/2018 at Unknown time  . potassium chloride SA (KLOR-CON) 20 MEQ tablet Take 20 mEq by mouth daily.   12/15/2018 at Unknown time  . sacubitril-valsartan (ENTRESTO) 24-26 MG Take 1 tablet by mouth 2 (two) times daily. 60 tablet 11 12/15/2018 at Unknown time  . spironolactone (ALDACTONE) 25 MG tablet Take 1 tablet (25 mg total) by mouth daily. 30 tablet 6 12/14/2018 at Unknown time  . vitamin B-12 (CYANOCOBALAMIN) 500 MCG tablet Take 1,000 mcg  by mouth daily.   12/14/2018 at Unknown time   Scheduled: . aspirin EC  81 mg Oral Daily  . atorvastatin  20 mg Oral q1800  . carvedilol  6.25 mg Oral BID WC  . digoxin  0.125 mg Oral Daily  . furosemide  40 mg Oral Daily  . insulin aspart  0-9 Units Subcutaneous Q4H  . insulin glargine  10 Units Subcutaneous Daily  . Magnesium  1 tablet Oral QPM  . multivitamin with minerals  1 tablet Oral QPM  . potassium chloride SA  20 mEq Oral Daily  . sacubitril-valsartan  1 tablet Oral BID  . spironolactone  25 mg Oral Daily  . Super B Complex/FA/Vit C   Oral Daily    Results for orders placed or performed during the hospital encounter of 12/15/18 (from the past 48 hour(s))  Comprehensive metabolic panel     Status: Abnormal   Collection Time: 12/15/18  2:06 PM  Result Value Ref Range   Sodium 130 (L) 135 - 145 mmol/L   Potassium 4.6 3.5 - 5.1 mmol/L   Chloride 96 (L) 98 - 111 mmol/L   CO2 26 22 - 32 mmol/L   Glucose, Bld 275 (H) 70 - 99 mg/dL   BUN 20 6 - 20 mg/dL   Creatinine, Ser 0.85 0.61 - 1.24 mg/dL   Calcium 9.2 8.9 - 10.3 mg/dL   Total Protein 7.3 6.5 - 8.1 g/dL   Albumin 3.8 3.5 - 5.0 g/dL   AST 19 15 - 41 U/L   ALT 28 0 - 44 U/L   Alkaline Phosphatase 63 38 - 126 U/L   Total Bilirubin 0.6 0.3 - 1.2 mg/dL   GFR calc non Af Amer >60 >60 mL/min   GFR calc Af Amer >60 >60 mL/min   Anion gap 8 5 - 15    Comment: Performed at [facility], [address] [place], [place] [address]  CBC with Differential     Status: Abnormal   Collection Time: 12/15/18  2:06 PM  Result Value Ref Range   WBC 12.1 (H) 4.0 - 10.5 K/uL   RBC 4.57 4.22 - 5.81 MIL/uL   Hemoglobin 14.8 13.0 - 17.0 g/dL   HCT 43.8 39.0 - 52.0 %   MCV 95.8 80.0 - 100.0 fL   MCH 32.4 26.0 - 34.0 pg   MCHC 33.8 30.0 - 36.0 g/dL   RDW 11.4 (L) 11.5 - 15.5 %   Platelets 289 150 - 400 K/uL   nRBC 0.0 0.0 - 0.2 %   Neutrophils Relative % 70 %   Neutro Abs 8.4 (H) 1.7 - 7.7 K/uL   Lymphocytes Relative 20 %   Lymphs  Abs 2.4 0.7 - 4.0 K/uL   Monocytes Relative 6 %   Monocytes Absolute 0.8 0.1 - 1.0 K/uL  Eosinophils Relative 2 %   Eosinophils Absolute 0.3 0.0 - 0.5 K/uL   Basophils Relative 1 %   Basophils Absolute 0.1 0.0 - 0.1 K/uL   Immature Granulocytes 1 %   Abs Immature Granulocytes 0.17 (H) 0.00 - 0.07 K/uL    Comment: Performed at [facility], [address] [place], [place] [address]  Lactic acid     Status: None   Collection Time: 12/15/18  2:06 PM  Result Value Ref Range   Lactic Acid, Venous 1.6 0.5 - 1.9 mmol/L    Comment: Performed at [facility], [address] [place], [place] [address]  Blood Cultures x 2 sites     Status: None (Preliminary result)   Collection Time: 12/15/18  2:06 PM   Specimen: BLOOD RIGHT ARM  Result Value Ref Range   Specimen Description BLOOD RIGHT ARM    Special Requests      BOTTLES DRAWN AEROBIC AND ANAEROBIC Blood Culture adequate volume   Culture      NO GROWTH < 24 HOURS Performed at [facility], [address] [place], [place] [address]    Report Status PENDING   Sedimentation rate     Status: Abnormal   Collection Time: 12/15/18  2:06 PM  Result Value Ref Range   Sed Rate 38 (H) 0 - 16 mm/hr    Comment: Performed at [facility], [address], [place], [place] [address]  C-reactive protein     Status: Abnormal   Collection Time: 12/15/18  2:06 PM  Result Value Ref Range   CRP 1.0 (H) <1.0 mg/dL    Comment: Performed at [facility], [address] [place], [place] [address]  Hemoglobin A1c     Status: Abnormal   Collection Time: 12/15/18  2:06 PM  Result Value Ref Range   Hgb A1c MFr Bld 10.9 (H) 4.8 - 5.6 %    Comment: (NOTE) Pre diabetes:          5.7%-6.4% Diabetes:              >6.4% Glycemic control for   <7.0% adults with diabetes    Mean Plasma Glucose 266.13 mg/dL    Comment: Performed at [facility] [address] [place], [place] [address]  Blood Cultures x 2 sites     Status: None (Preliminary result)    Collection Time: 12/15/18  2:17 PM   Specimen: BLOOD LEFT ARM  Result Value Ref Range   Specimen Description BLOOD LEFT ARM    Special Requests      BOTTLES DRAWN AEROBIC AND ANAEROBIC Blood Culture adequate volume   Culture      NO GROWTH < 24 HOURS Performed at [facility], [address] [place], [place] [address]    Report Status PENDING   POC SARS Coronavirus 2 Ag-ED - Nasal Swab (BD Veritor Kit)     Status: None   Collection Time: 12/15/18  4:10 PM  Result Value Ref Range   SARS Coronavirus 2 Ag NEGATIVE NEGATIVE    Comment: (NOTE) SARS-CoV-2 antigen NOT DETECTED.  Negative results are presumptive.  Negative results do not preclude SARS-CoV-2 infection and should not be used as the sole basis for treatment or other patient management decisions, including infection  control decisions, particularly in the presence of clinical signs and  symptoms consistent with COVID-19, or in those who have been in contact with the virus.  Negative results must be combined with clinical observations, patient history, and epidemiological information. The  expected result is Negative. Fact Sheet for Patients: PodPark.tn Fact Sheet for Healthcare Providers: GiftContent.is This test is not yet approved or cleared by the [place] FDA and  has been authorized for detection and/or diagnosis of SARS-CoV-2 by FDA under an Emergency Use Authorization (EUA).  This EUA will remain in effect (meaning this test can be used) for the duration of  the COVID-19 de claration under Section 564(b)(1) of the Act, 21 U.S.C. section 360bbb-3(b)(1), unless the authorization is terminated or revoked sooner.   Glucose, capillary     Status: Abnormal   Collection Time: 12/15/18 10:17 PM  Result Value Ref Range   Glucose-Capillary 332 (H) 70 - 99 mg/dL  Glucose, capillary     Status: Abnormal   Collection Time: 12/16/18  4:13 AM  Result Value Ref Range    Glucose-Capillary 225 (H) 70 - 99 mg/dL  Glucose, capillary     Status: Abnormal   Collection Time: 12/16/18  7:40 AM  Result Value Ref Range   Glucose-Capillary 235 (H) 70 - 99 mg/dL    Dg Foot Complete Right  Result Date: 12/15/2018 CLINICAL DATA:  Swelling of right middle toe EXAM: RIGHT FOOT COMPLETE - 3+ VIEW COMPARISON:  None. FINDINGS: There is swelling of the right third digit. The third distal phalanx is eroded. The DIP joint appears preserved. No additional significant osseous abnormality. IMPRESSION: Right third digit soft tissue swelling with erosion of the distal phalanx suspicious for osteomyelitis. Electronically Signed   By: Macy Mis M.D.   On: 12/15/2018 11:49    ROS:  Pertinent items are noted in HPI.  Blood pressure 118/69, pulse 68, temperature 98.9 F (37.2 C), temperature source Oral, resp. rate 20, height 5' 9"  (1.753 m), weight 97.7 kg, SpO2 100 %. Physical Exam: Pleasant white male no acute distress Head is normocephalic, atraumatic Lungs are clear to auscultation with good breath sounds bilaterally Heart examination reveals a regular rate and rhythm without significant murmurs Extremity examination reveals easily palpable dorsalis pedis and posterior tibial pulses in the right foot.  Please see pictures of the right third toe.  Some purulent drainage is noted.  Significant swelling and erythema is noted throughout the toe.  This does not extend into the foot itself.  Assessment/Plan: Impression: Cellulitis of right third toe, history of congestive heart failure, newly diagnosed diabetes mellitus, probable osteomyelitis of the toe Plan: We will get MRI of right foot to further evaluate the presence of osteomyelitis.  Patient may ultimately need a toe amputation pending this work-up.  MRI will help determine antibiotic course.  This was told to the patient.  Discussed with Dr. Luan Pulling.  Aviva Signs 12/16/2018, 8:24 AM

## 2018-12-16 NOTE — Progress Notes (Addendum)
Inpatient Diabetes Program Recommendations  AACE/ADA: New Consensus Statement on Inpatient Glycemic Control   Target Ranges:  Prepandial:   less than 140 mg/dL      Peak postprandial:   less than 180 mg/dL (1-2 hours)      Critically ill patients:  140 - 180 mg/dL   Results for Fernando Williams, Fernando Williams (MRN [id number]) as of 12/16/2018 08:10  Ref. Range 12/15/2018 22:17 12/16/2018 04:13 12/16/2018 07:40  Glucose-Capillary Latest Ref Range: 70 - 99 mg/dL 332 (H) 225 (H) 235 (H)  Results for Fernando Williams, Fernando Williams (MRN [id number]) as of 12/16/2018 08:10  Ref. Range 07/21/2018 05:57 12/15/2018 14:06  Hemoglobin A1C Latest Ref Range: 4.8 - 5.6 % 8.6 (H) 10.9 (H)   Review of Glycemic Control  Diabetes history: DM2 (dx in July 2020) Outpatient Diabetes medications: None Current orders for Inpatient glycemic control: Novolog 0-9 units Q4H  Inpatient Diabetes Program Recommendations:   Insulin - Basal: Please consider ordering Lantus 10 units Q24H.  HgbA1C: A1C 10.9% on 12/15/18 indicating an average glucose of 266 mg/dl over the past 2-3 months. Patient will need DM medications prescribed at discharge.  NOTE: Per chart review, noted patient was dx with DM in July 2020 during hospitalization. Per home medication list, patient is not currently taking any medications for DM. A1C 10.9% on 12/15/18 indicating an average glucose of 266 mg/dl. Patient will need DM medication prescribed at time of discharge.  Addendum 12/16/18@15 :20- Have attempted to reach patient by phone several times today but no answer on room phone or cell phone number in chart. In reviewing chart, noted patient was seen by inpatient diabetes coordinator on 07/21/18 regarding new DM dx during prior hospitalization.  Will attempt to contact patient again tomorrow to discuss A1C and importance of DM control.   Thanks,  Barnie Alderman, RN, MSN, CDE Diabetes Coordinator Inpatient Diabetes Program [phone] (Team Pager from 8am to 5pm)

## 2018-12-17 ENCOUNTER — Inpatient Hospital Stay: Payer: Self-pay

## 2018-12-17 LAB — GLUCOSE, CAPILLARY
Glucose-Capillary: 133 mg/dL — ABNORMAL HIGH (ref 70–99)
Glucose-Capillary: 165 mg/dL — ABNORMAL HIGH (ref 70–99)
Glucose-Capillary: 180 mg/dL — ABNORMAL HIGH (ref 70–99)
Glucose-Capillary: 197 mg/dL — ABNORMAL HIGH (ref 70–99)
Glucose-Capillary: 271 mg/dL — ABNORMAL HIGH (ref 70–99)
Glucose-Capillary: 321 mg/dL — ABNORMAL HIGH (ref 70–99)
Glucose-Capillary: 321 mg/dL — ABNORMAL HIGH (ref 70–99)

## 2018-12-17 NOTE — Progress Notes (Signed)
Inpatient Diabetes Program Recommendations  AACE/ADA: New Consensus Statement on Inpatient Glycemic Control   Target Ranges:  Prepandial:   less than 140 mg/dL      Peak postprandial:   less than 180 mg/dL (1-2 hours)      Critically ill patients:  140 - 180 mg/dL   Results for Fernando Williams, Fernando Williams (MRN [id number]) as of 12/17/2018 12:50  Ref. Range 12/16/2018 07:40 12/16/2018 11:16 12/16/2018 16:56 12/16/2018 22:03 12/17/2018 00:59 12/17/2018 04:54 12/17/2018 07:32 12/17/2018 11:14  Glucose-Capillary Latest Ref Range: 70 - 99 mg/dL 235 (H) 309 (H) 283 (H) 165 (H) 133 (H) 180 (H) 197 (H) 321 (H)   Review of Glycemic Control  Diabetes history: DM2 (dx in July 2020) Outpatient Diabetes medications: None Current orders for Inpatient glycemic control: Lantus 10 units daily, Novolog 0-9 units Q4H  Inpatient Diabetes Program Recommendations:   Insulin-Basal: Please consider increasing Lantus to 15 units daily.  Insulin-Meal Coverage: Please consider ordering Novolog 4 units TID with meals for meal coverage if patient eats at least 50% of meals.  HgbA1C: A1C 10.9% on 12/15/18 indicating an average glucose of 266 mg/dl over the past 2-3 months. Patient will need DM medications prescribed at discharge.  NOTE: Per chart review, noted patient was dx with DM in July 2020 during hospitalization. Per home medication list, patient is not currently taking any medications for DM. A1C 10.9% on 12/15/18 indicating an average glucose of 266 mg/dl. Patient will need DM medication prescribed at time of discharge. In reviewing chart, noted patient was seen by inpatient diabetes coordinator on 07/21/18 regarding new DM dx during prior hospitalization.   Spoke with patient over the phone about diabetes and home regimen for diabetes control. Patient reports that he was actually dx with DM by PCP earlier in the year (prior to July 2020) as glucose went up when he was on steroids in March for pneumonia.  Patient states that his  mother had DM so he has some knowledge about DM plus he was educated in July when he was inpatient.  Patient states that PCP had prescribed Metformin but had told him not to start it until the cardiologist told him too.  Unfortunately patient had never been told to actually start taking the Metformin so he has not taken anything for DM since dx. Patient states that he was checking his glucose at least 2 times per day and it has been running consistently over 200 mg/dl for the past few weeks.   Discussed A1C results (10.9% on 12/15/18 ) and explained that current A1C indicates an average glucose of 266 mg/dl over the past 2-3 months. Discussed glucose and A1C goals. Discussed importance of checking CBGs and maintaining good CBG control to prevent long-term and short-term complications. Explained how hyperglycemia leads to damage within blood vessels which lead to the common complications seen with uncontrolled diabetes. Stressed to the patient the importance of improving glycemic control to prevent further complications from uncontrolled diabetes. Discussed impact of nutrition, exercise, stress, sickness, and medications on diabetes control.  Discussed carbohydrates, carbohydrate goals per day and meal, along with portion sizes. Encouraged patient to check glucose at least 2 times per day and to keep a log book of glucose readings and DM medications taken which patient will need to take to doctor appointments. Explained how the doctor can use the log book to continue to make adjustments with DM medications if needed. Explained that his discharge paperwork will have what DM medications he needs to take as an  outpatient. Explained that he may need more than Metformin to get DM controlled and especially since he has osteomyelitis as glucose control is very important to decrease risk of further complications. Patient is willing to take DM medications to improve DM control.  Patient verbalized understanding of information  discussed and reports no further questions at this time related to diabetes.  Thanks, Barnie Alderman, RN, MSN, CDE Diabetes Coordinator Inpatient Diabetes Program [phone] (Team Pager)

## 2018-12-17 NOTE — Progress Notes (Signed)
Subjective: He says he feels better.  He is hopeful to go home soon.  He is on cefepime and Flagyl and his toe looks substantially better.  His culture is pending.  Objective: Vital signs in last 24 hours: Temp:  [97.8 F (36.6 C)-99.2 F (37.3 C)] 97.8 F (36.6 C) (12/02 0622) Pulse Rate:  [60-68] 60 (12/02 0622) Resp:  [18] 18 (12/02 0453) BP: (95-131)/(62-70) 107/62 (12/02 0622) SpO2:  [94 %-99 %] 98 % (12/02 0453) Weight change:     Intake/Output from previous day: 12/01 0701 - 12/02 0700 In: 200 [IV Piggyback:200] Out: 500 [Urine:500]  PHYSICAL EXAM General appearance: alert, cooperative and no distress Resp: clear to auscultation bilaterally Cardio: regular rate and rhythm, S1, S2 normal, no murmur, click, rub or gallop GI: soft, non-tender; bowel sounds normal; no masses,  no organomegaly Extremities: He still has substantial erythema of his toe but it looks much better.  Lab Results:  Results for orders placed or performed during the hospital encounter of 12/15/18 (from the past 48 hour(s))  Comprehensive metabolic panel     Status: Abnormal   Collection Time: 12/15/18  2:06 PM  Result Value Ref Range   Sodium 130 (L) 135 - 145 mmol/L   Potassium 4.6 3.5 - 5.1 mmol/L   Chloride 96 (L) 98 - 111 mmol/L   CO2 26 22 - 32 mmol/L   Glucose, Bld 275 (H) 70 - 99 mg/dL   BUN 20 6 - 20 mg/dL   Creatinine, Ser 0.85 0.61 - 1.24 mg/dL   Calcium 9.2 8.9 - 10.3 mg/dL   Total Protein 7.3 6.5 - 8.1 g/dL   Albumin 3.8 3.5 - 5.0 g/dL   AST 19 15 - 41 U/L   ALT 28 0 - 44 U/L   Alkaline Phosphatase 63 38 - 126 U/L   Total Bilirubin 0.6 0.3 - 1.2 mg/dL   GFR calc non Af Amer >60 >60 mL/min   GFR calc Af Amer >60 >60 mL/min   Anion gap 8 5 - 15    Comment: Performed at [facility], [address], [place], [place] [address]  CBC with Differential     Status: Abnormal   Collection Time: 12/15/18  2:06 PM  Result Value Ref Range   WBC 12.1 (H) 4.0 - 10.5 K/uL   RBC 4.57 4.22 -  5.81 MIL/uL   Hemoglobin 14.8 13.0 - 17.0 g/dL   HCT 43.8 39.0 - 52.0 %   MCV 95.8 80.0 - 100.0 fL   MCH 32.4 26.0 - 34.0 pg   MCHC 33.8 30.0 - 36.0 g/dL   RDW 11.4 (L) 11.5 - 15.5 %   Platelets 289 150 - 400 K/uL   nRBC 0.0 0.0 - 0.2 %   Neutrophils Relative % 70 %   Neutro Abs 8.4 (H) 1.7 - 7.7 K/uL   Lymphocytes Relative 20 %   Lymphs Abs 2.4 0.7 - 4.0 K/uL   Monocytes Relative 6 %   Monocytes Absolute 0.8 0.1 - 1.0 K/uL   Eosinophils Relative 2 %   Eosinophils Absolute 0.3 0.0 - 0.5 K/uL   Basophils Relative 1 %   Basophils Absolute 0.1 0.0 - 0.1 K/uL   Immature Granulocytes 1 %   Abs Immature Granulocytes 0.17 (H) 0.00 - 0.07 K/uL    Comment: Performed at [facility], [address] [place], [place] [address]  Lactic acid     Status: None   Collection Time: 12/15/18  2:06 PM  Result Value Ref  Range   Lactic Acid, Venous 1.6 0.5 - 1.9 mmol/L    Comment: Performed at [facility], [address], [place], [place] [address]  Blood Cultures x 2 sites     Status: None (Preliminary result)   Collection Time: 12/15/18  2:06 PM   Specimen: BLOOD RIGHT ARM  Result Value Ref Range   Specimen Description BLOOD RIGHT ARM    Special Requests      BOTTLES DRAWN AEROBIC AND ANAEROBIC Blood Culture adequate volume   Culture      NO GROWTH 2 DAYS Performed at [facility], [address], [place], [place] [address]    Report Status PENDING   Sedimentation rate     Status: Abnormal   Collection Time: 12/15/18  2:06 PM  Result Value Ref Range   Sed Rate 38 (H) 0 - 16 mm/hr    Comment: Performed at [facility], [address], [place], [place] [address]  C-reactive protein     Status: Abnormal   Collection Time: 12/15/18  2:06 PM  Result Value Ref Range   CRP 1.0 (H) <1.0 mg/dL    Comment: Performed at [facility], [address], [place], [place] [address]  Hemoglobin A1c     Status: Abnormal   Collection Time: 12/15/18  2:06 PM  Result Value Ref Range   Hgb A1c MFr Bld 10.9 (H)  4.8 - 5.6 %    Comment: (NOTE) Pre diabetes:          5.7%-6.4% Diabetes:              >6.4% Glycemic control for   <7.0% adults with diabetes    Mean Plasma Glucose 266.13 mg/dL    Comment: Performed at [facility] [address] [place], [place] [address]  Blood Cultures x 2 sites     Status: None (Preliminary result)   Collection Time: 12/15/18  2:17 PM   Specimen: BLOOD LEFT ARM  Result Value Ref Range   Specimen Description BLOOD LEFT ARM    Special Requests      BOTTLES DRAWN AEROBIC AND ANAEROBIC Blood Culture adequate volume   Culture      NO GROWTH 2 DAYS Performed at [facility], [address] [place], [place] [address]    Report Status PENDING   POC SARS Coronavirus 2 Ag-ED - Nasal Swab (BD Veritor Kit)     Status: None   Collection Time: 12/15/18  4:10 PM  Result Value Ref Range   SARS Coronavirus 2 Ag NEGATIVE NEGATIVE    Comment: (NOTE) SARS-CoV-2 antigen NOT DETECTED.  Negative results are presumptive.  Negative results do not preclude SARS-CoV-2 infection and should not be used as the sole basis for treatment or other patient management decisions, including infection  control decisions, particularly in the presence of clinical signs and  symptoms consistent with COVID-19, or in those who have been in contact with the virus.  Negative results must be combined with clinical observations, patient history, and epidemiological information. The expected result is Negative. Fact Sheet for Patients: PodPark.tn Fact Sheet for Healthcare Providers: GiftContent.is This test is not yet approved or cleared by the [place] FDA and  has been authorized for detection and/or diagnosis of SARS-CoV-2 by FDA under an Emergency Use Authorization (EUA).  This EUA will remain in effect (meaning this test can be used) for the duration of  the COVID-19 de claration under Section 564(b)(1) of the Act,  21 U.S.C. section 360bbb-3(b)(1), unless the authorization  is terminated or revoked sooner.   Glucose, capillary     Status: Abnormal   Collection Time: 12/15/18 10:17 PM  Result Value Ref Range   Glucose-Capillary 332 (H) 70 - 99 mg/dL  Glucose, capillary     Status: Abnormal   Collection Time: 12/16/18  4:13 AM  Result Value Ref Range   Glucose-Capillary 225 (H) 70 - 99 mg/dL  Glucose, capillary     Status: Abnormal   Collection Time: 12/16/18  7:40 AM  Result Value Ref Range   Glucose-Capillary 235 (H) 70 - 99 mg/dL  Aerobic Culture (superficial specimen)     Status: None (Preliminary result)   Collection Time: 12/16/18  8:13 AM   Specimen: Toe; Wound  Result Value Ref Range   Specimen Description      TOE RIGHT Performed at [facility], [address], [place], [place] [address]    Special Requests      NONE Performed at [facility], [address] [place], [place] [address]    Gram Stain      NO WBC SEEN ABUNDANT GRAM POSITIVE COCCI RARE GRAM POSITIVE RODS Performed at [facility], [place] [address], [place], [place] [address]    Culture PENDING    Report Status PENDING   Glucose, capillary     Status: Abnormal   Collection Time: 12/16/18 11:16 AM  Result Value Ref Range   Glucose-Capillary 309 (H) 70 - 99 mg/dL  Glucose, capillary     Status: Abnormal   Collection Time: 12/16/18  4:56 PM  Result Value Ref Range   Glucose-Capillary 283 (H) 70 - 99 mg/dL   Comment 1 Notify RN    Comment 2 Document in Chart   Glucose, capillary     Status: Abnormal   Collection Time: 12/16/18 10:03 PM  Result Value Ref Range   Glucose-Capillary 165 (H) 70 - 99 mg/dL  Glucose, capillary     Status: Abnormal   Collection Time: 12/17/18 12:59 AM  Result Value Ref Range   Glucose-Capillary 133 (H) 70 - 99 mg/dL  Glucose, capillary     Status: Abnormal   Collection Time: 12/17/18  4:54 AM  Result Value Ref Range   Glucose-Capillary 180 (H) 70 - 99 mg/dL    ABGS No  results for input(s): PHART, PO2ART, TCO2, HCO3 in the last 72 hours.  Invalid input(s): PCO2 CULTURES Recent Results (from the past 240 hour(s))  Blood Cultures x 2 sites     Status: None (Preliminary result)   Collection Time: 12/15/18  2:06 PM   Specimen: BLOOD RIGHT ARM  Result Value Ref Range Status   Specimen Description BLOOD RIGHT ARM  Final   Special Requests   Final    BOTTLES DRAWN AEROBIC AND ANAEROBIC Blood Culture adequate volume   Culture   Final    NO GROWTH 2 DAYS Performed at [facility], [address] [place], [place] [address]    Report Status PENDING  Incomplete  Blood Cultures x 2 sites     Status: None (Preliminary result)   Collection Time: 12/15/18  2:17 PM   Specimen: BLOOD LEFT ARM  Result Value Ref Range Status   Specimen Description BLOOD LEFT ARM  Final   Special Requests   Final    BOTTLES DRAWN AEROBIC AND ANAEROBIC Blood Culture adequate volume   Culture   Final    NO GROWTH 2 DAYS Performed at [facility], [address] [place], [place] [address]    Report  Status PENDING  Incomplete  Aerobic Culture (superficial specimen)     Status: None (Preliminary result)   Collection Time: 12/16/18  8:13 AM   Specimen: Toe; Wound  Result Value Ref Range Status   Specimen Description   Final    TOE RIGHT Performed at [facility], [address] [place], [place] [address]    Special Requests   Final    NONE Performed at [facility], [address] [place], [place] [address]    Gram Stain   Final    NO WBC SEEN ABUNDANT GRAM POSITIVE COCCI RARE GRAM POSITIVE RODS Performed at [facility], [place] [address], [place], [place] [address]    Culture PENDING  Incomplete   Report Status PENDING  Incomplete   Studies/Results: Mr Foot Right W Wo Contrast  Result Date: 12/16/2018 CLINICAL DATA:  Diabetic patient with pain and swelling of the right third toe. Pain and swelling began after the patient clipped is toenails 3-4 weeks ago. EXAM: MRI  OF THE RIGHT FOREFOOT WITHOUT AND WITH CONTRAST TECHNIQUE: Multiplanar, multisequence MR imaging of the right forefoot was performed before and after the administration of intravenous contrast. CONTRAST:  7 mL GADAVIST  IV COMPARISON:  Plain films of the right foot 12/15/2018. FINDINGS: Bones/Joint/Cartilage There is edema and enhancement throughout the middle and distal phalanges of the third toe consistent with osteomyelitis. Marrow signal is otherwise unremarkable. Ligaments Intact. Muscles and Tendons No intramuscular fluid collection or focal lesion. Soft tissues A fluid collection consistent with abscess just off the tuft of the distal phalanx of the third toe measures approximately 1 cm transverse by 0.7 cm craniocaudal by 0.4 cm long. Soft tissues of the third toe are edematous and enhancing. IMPRESSION: 1. Findings consistent with osteomyelitis throughout the middle and distal phalanges of the third toe. 2. Small abscess just off the tuft of the distal phalanx of the third toe. Electronically Signed   By: Inge Rise M.D.   On: 12/16/2018 11:57   US Arterial Abi (screening Lower Extremity)  Result Date: 12/16/2018 CLINICAL DATA:  58 year old male with right third toe wound and osteomyelitis. EXAM: NONINVASIVE PHYSIOLOGIC VASCULAR STUDY OF BILATERAL LOWER EXTREMITIES TECHNIQUE: Evaluation of both lower extremities were performed at rest, including calculation of ankle-brachial indices with single level Doppler, pressure and pulse volume recording. COMPARISON:  None. FINDINGS: Right ABI:  1.2 Left ABI:  1.1 Right Lower Extremity: Relatively normal posterior tibial arterial waveform. Mildly blunted biphasic dorsalis pedis waveform. Left Lower Extremity: Relatively normal posterior tibial arterial waveform. Blunted monophasic dorsalis pedis waveform. 1.0-1.4 Normal IMPRESSION: 1. The bilateral resting ankle-brachial indices are within normal limits. 2. Abnormal dorsalis pedis arterial waveforms, worse  on the left than the right. This suggests an element of underlying runoff (below the knee) disease. Patient may benefit from referral to Interventional Radiology or other endovascular specialist. Signed, Criselda Peaches, MD, [facility] Electronically Signed   By: Jacqulynn Cadet M.D.   On: 12/16/2018 12:41   Dg Foot Complete Right  Result Date: 12/15/2018 CLINICAL DATA:  Swelling of right middle toe EXAM: RIGHT FOOT COMPLETE - 3+ VIEW COMPARISON:  None. FINDINGS: There is swelling of the right third digit. The third distal phalanx is eroded. The DIP joint appears preserved. No additional significant osseous abnormality. IMPRESSION: Right third digit soft tissue swelling with erosion of the distal phalanx suspicious for osteomyelitis. Electronically Signed   By: Macy Mis M.D.   On: 12/15/2018 11:49  [place] Ekg Site Rite  Result Date: 12/17/2018 If Site Rite image not attached, placement could not be confirmed due to current cardiac rhythm.   Medications:  Prior to Admission:  Medications Prior to Admission  Medication Sig Dispense Refill Last Dose  . albuterol (PROVENTIL HFA;VENTOLIN HFA) 108 (90 Base) MCG/ACT inhaler Inhale 2 puffs into the lungs 3 (three) times daily. (Patient taking differently: Inhale 2 puffs into the lungs every 6 (six) hours as needed for wheezing or shortness of breath. ) 1 Inhaler 1 unknown  . aspirin EC 81 MG EC tablet Take 1 tablet (81 mg total) by mouth daily. 30 tablet 6 12/15/2018 at Unknown time  . atorvastatin (LIPITOR) 20 MG tablet Take 1 tablet (20 mg total) by mouth daily at 6 PM. 30 tablet 6 12/14/2018 at Unknown time  . B Complex-C-Folic Acid (SUPER B COMPLEX/FA/VIT C PO) Take 1 tablet by mouth daily.    12/14/2018 at Unknown time  . carvedilol (COREG) 6.25 MG tablet Take 1 tablet (6.25 mg total) by mouth 2 (two) times daily with a meal. 180 tablet 3 12/15/2018 at 643  . digoxin  (LANOXIN) 0.125 MG tablet Take 1 tablet (0.125 mg total) by mouth daily. 30 tablet 6 12/15/2018 at Unknown time  . furosemide (LASIX) 40 MG tablet Take 1 tablet (40 mg total) by mouth daily. 90 tablet 3 12/14/2018 at Unknown time  . glucosamine-chondroitin 500-400 MG tablet Take 2 tablets by mouth daily.    12/14/2018 at Unknown time  . Magnesium 250 MG TABS Take 1 tablet by mouth every evening.    12/14/2018 at Unknown time  . Multiple Vitamin (MULTIVITAMIN) tablet Take 1 tablet by mouth every evening.    12/14/2018 at Unknown time  . potassium chloride SA (KLOR-CON) 20 MEQ tablet Take 20 mEq by mouth daily.   12/15/2018 at Unknown time  . sacubitril-valsartan (ENTRESTO) 24-26 MG Take 1 tablet by mouth 2 (two) times daily. 60 tablet 11 12/15/2018 at Unknown time  . spironolactone (ALDACTONE) 25 MG tablet Take 1 tablet (25 mg total) by mouth daily. 30 tablet 6 12/14/2018 at Unknown time  . vitamin B-12 (CYANOCOBALAMIN) 500 MCG tablet Take 1,000 mcg by mouth daily.   12/14/2018 at Unknown time   Scheduled: . aspirin EC  81 mg Oral Daily  . atorvastatin  20 mg Oral q1800  . B-complex with vitamin C  1 tablet Oral Daily  . carvedilol  6.25 mg Oral BID WC  . digoxin  0.125 mg Oral Daily  . furosemide  40 mg Oral Daily  . insulin aspart  0-9 Units Subcutaneous Q4H  . insulin glargine  10 Units Subcutaneous Daily  . magnesium oxide  200 mg Oral QPM  . multivitamin with minerals  1 tablet Oral QPM  . potassium chloride SA  20 mEq Oral Daily  . sacubitril-valsartan  1 tablet Oral BID  . spironolactone  25 mg Oral Daily   Continuous: . ceFEPime (MAXIPIME) IV 2 g (12/17/18 0505)  . metronidazole 500 mg (12/17/18 0758)   PPJ:[id number] **OR** acetaminophen, albuterol, ondansetron **OR** ondansetron (ZOFRAN) IV, polyethylene glycol  Assesment: He has osteomyelitis of the toe.  He has been seen by general surgery and recommendation is to go ahead with PICC line and IV antibiotics which I think  is reasonable.  I told him he would need treatment for about 6 weeks and there is a substantial chance that he would need amputation but we can try the IV antibiotics.  He has diabetes  and is on sliding scale.  He had not been taking his medication at home and I think that suggest a communication error.  He has heart failure and his ejection fraction has improved. Active Problems:   Toe osteomyelitis, right (HCC)    Plan: PICC line placement.  Await culture and perhaps he could be on the medications is not required to be taken 3 times a day.  Probable discharge tomorrow    LOS: 2 days   Alonza Bogus 12/17/2018, 8:28 AM

## 2018-12-17 NOTE — Progress Notes (Signed)
Spoke with Beverlee Nims RN re: PICC placement, pending blood culture result. Will follow up.

## 2018-12-17 NOTE — Telephone Encounter (Signed)
Advanced Heart Failure Patient Advocate Encounter  Prior Authorization for Entresto 24-26mg  has been approved.    PA# [id number] Effective dates: 12/15/2018 through 12/15/2019  Patients co-pay is $0.00   Confirmed that it was ready at [facility].  Charlann Boxer, CPhT

## 2018-12-17 NOTE — TOC Progression Note (Signed)
Transition of Care [facility]) - Progression Note    Patient Details  Name: Fernando Williams MRN: [id number] Date of Birth: Jan 11, 1961  [facility]) CM/SW Contact  Neylan Koroma, Chauncey Reading, RN Phone Number: 12/17/2018, 2:30 PM  Clinical Narrative:   Jeannene Patella has received consult, agreeable to patient being able to self administer antibiotic. Will followu up when we have culture results.   Dr. Benny Lennert is agreeable to establishing PCP but defers osteo treatment to surgery/podiatry, she will contact Dr. Luan Pulling to discuss.     Expected Discharge Plan: [place] Barriers to Discharge: Continued Medical Work up  Expected Discharge Plan and Services Expected Discharge Plan: [place]   Discharge Planning Services: CM Consult Post Acute Care Choice: Home Health                             HH Arranged: IV Antibiotics HH Agency: Other - See comment([facility])         Social Determinants of Health (SDOH) Interventions    Readmission Risk Interventions No flowsheet data found.

## 2018-12-17 NOTE — TOC Initial Note (Addendum)
Transition of Care [facility]) - Initial/Assessment Note    Patient Details  Name: Fernando Williams MRN: [id number] Date of Birth: 08-16-1960  Transition [facility] Care [facility]) CM/SW Contact:    Priseis Cratty, Chauncey Reading, RN Phone Number: 12/17/2018, 2:10 PM  Clinical Narrative:      Discussed IV antibiotics at home with patient. 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 He is eager to get back to work. Currently he is getting two antibiotics, TID. Awaiting culture results, hopeful to switch to a once daily antibiotic. He would need to be able to administer the antibiotic himself.   Agreeable to referral with [facility]. Call to Carolynn Sayers to discuss.             Also patient will need to establish care with another PCP. Contacted Dr. Benny Lennert, who will review chart and get back with CM.    Expected Discharge Plan: [place] Barriers to Discharge: Continued Medical Work up     Expected Discharge Plan and Services Expected Discharge Plan: [place]   Discharge Planning Services: CM Consult Post Acute Care Choice: Home Health                             HH Arranged: IV Antibiotics HH Agency: Other - See comment([facility])        Prior Living Arrangements/Services   Lives with:: Self                   Activities of Daily Living Home Assistive Devices/Equipment: None ADL Screening (condition at time of admission) Patient's cognitive ability adequate to safely complete daily activities?: Yes Is the patient deaf or have difficulty hearing?: No Does the patient have difficulty seeing, even when wearing glasses/contacts?: No Does the patient have difficulty concentrating, remembering, or making decisions?: No Patient able to express need for assistance with ADLs?: Yes Does the patient have difficulty dressing or bathing?: No Independently performs ADLs?: Yes (appropriate for developmental age) Does the  patient have difficulty walking or climbing stairs?: No Weakness of Legs: None Weakness of Arms/Hands: None  Permission Sought/Granted                  Emotional Assessment     Affect (typically observed): Appropriate        Admission diagnosis:  Osteomyelitis ([place]) [M86.9] Osteomyelitis of right foot, unspecified type [facility]) [M86.9] Patient Active Problem List   Diagnosis Date Noted  . Toe osteomyelitis, right ([place]) 12/15/2018  . CHF exacerbation ([place]) 07/19/2018  . CHF (congestive heart failure) ([place]) 07/18/2018  . Community acquired pneumonia   . Acute respiratory failure with hypoxia ([place]) 04/09/2018  . Interstitial pneumonia ([place]) 04/09/2018  . Tobacco abuse 04/09/2018  . Thrombocytopenia ([place]) 04/09/2018  . Hyperkalemia 04/09/2018  . SPRAIN&STRAIN OTH SPEC SITES SHOULDER&UPPER ARM 07/12/2008   PCP:  Sinda Du, MD Pharmacy:   [place] [place] - [address] AT [address] [place] [address] Phone: [phone] Fax: [phone]     Social Determinants of Health (SDOH) Interventions    Readmission Risk Interventions No flowsheet data found.

## 2018-12-17 NOTE — Progress Notes (Signed)
Subjective: Patient was admitted yesterday for osteomyelitis of third digit of right foot. He was initially started on IV Zosyn then switched to Flagyl and Cefepime following culture results. He reports that toe is improving and would like to go home soon. Denies pain, fever.   Objective: Vital signs in last 24 hours: Temp:  [97.8 F (36.6 C)-99.2 F (37.3 C)] 97.8 F (36.6 C) (12/02 0622) Pulse Rate:  [60-66] 60 (12/02 0622) Resp:  [18] 18 (12/02 0453) BP: (95-131)/(62-70) 107/62 (12/02 0622) SpO2:  [94 %-99 %] 97 % (12/02 0745) Last BM Date: 12/17/18  Intake/Output from previous day: 12/01 0701 - 12/02 0700 In: 200 [IV Piggyback:200] Out: 500 [Urine:500] Intake/Output this shift: Total I/O In: 240 [P.O.:240] Out: -   General appearance: alert, cooperative, and no distress Extremities: 2+ right-sided dorsalis pedis and posterior tibialis pulses; mild purulent drainage of distal third toe of right foot noted; moderate erythema and swelling of toe also noted; base of toe and foot are spared    Lab Results:  Recent Labs    12/15/18 1406  WBC 12.1*  HGB 14.8  HCT 43.8  PLT 289   BMET Recent Labs    12/15/18 1406  NA 130*  K 4.6  CL 96*  CO2 26  GLUCOSE 275*  BUN 20  CREATININE 0.85  CALCIUM 9.2   PT/INR No results for input(s): LABPROT, INR in the last 72 hours.  Studies/Results: Mr Foot Right W Wo Contrast  Result Date: 12/16/2018 CLINICAL DATA:  Diabetic patient with pain and swelling of the right third toe. Pain and swelling began after the patient clipped is toenails 3-4 weeks ago. EXAM: MRI OF THE RIGHT FOREFOOT WITHOUT AND WITH CONTRAST TECHNIQUE: Multiplanar, multisequence MR imaging of the right forefoot was performed before and after the administration of intravenous contrast. CONTRAST:  7 mL GADAVIST  IV COMPARISON:  Plain films of the right foot 12/15/2018. FINDINGS: Bones/Joint/Cartilage There is edema and enhancement throughout the middle and  distal phalanges of the third toe consistent with osteomyelitis. Marrow signal is otherwise unremarkable. Ligaments Intact. Muscles and Tendons No intramuscular fluid collection or focal lesion. Soft tissues A fluid collection consistent with abscess just off the tuft of the distal phalanx of the third toe measures approximately 1 cm transverse by 0.7 cm craniocaudal by 0.4 cm long. Soft tissues of the third toe are edematous and enhancing. IMPRESSION: 1. Findings consistent with osteomyelitis throughout the middle and distal phalanges of the third toe. 2. Small abscess just off the tuft of the distal phalanx of the third toe. Electronically Signed   By: Inge Rise M.D.   On: 12/16/2018 11:57   US Arterial Abi (screening Lower Extremity)  Result Date: 12/16/2018 CLINICAL DATA:  58 year old male with right third toe wound and osteomyelitis. EXAM: NONINVASIVE PHYSIOLOGIC VASCULAR STUDY OF BILATERAL LOWER EXTREMITIES TECHNIQUE: Evaluation of both lower extremities were performed at rest, including calculation of ankle-brachial indices with single level Doppler, pressure and pulse volume recording. COMPARISON:  None. FINDINGS: Right ABI:  1.2 Left ABI:  1.1 Right Lower Extremity: Relatively normal posterior tibial arterial waveform. Mildly blunted biphasic dorsalis pedis waveform. Left Lower Extremity: Relatively normal posterior tibial arterial waveform. Blunted monophasic dorsalis pedis waveform. 1.0-1.4 Normal IMPRESSION: 1. The bilateral resting ankle-brachial indices are within normal limits. 2. Abnormal dorsalis pedis arterial waveforms, worse on the left than the right. This suggests an element of underlying runoff (below the knee) disease. Patient may benefit from referral to Interventional Radiology or other endovascular  specialist. Signed, Criselda Peaches, MD, [place] [facility] Electronically Signed   By: Jacqulynn Cadet M.D.   On:  12/16/2018 12:41   Dg Foot Complete Right  Result Date: 12/15/2018 CLINICAL DATA:  Swelling of right middle toe EXAM: RIGHT FOOT COMPLETE - 3+ VIEW COMPARISON:  None. FINDINGS: There is swelling of the right third digit. The third distal phalanx is eroded. The DIP joint appears preserved. No additional significant osseous abnormality. IMPRESSION: Right third digit soft tissue swelling with erosion of the distal phalanx suspicious for osteomyelitis. Electronically Signed   By: Macy Mis M.D.   On: 12/15/2018 11:49   [place] Ekg Site Rite  Result Date: 12/17/2018 If Site Rite image not attached, placement could not be confirmed due to current cardiac rhythm.   Anti-infectives: Anti-infectives (From admission, onward)   Start     Dose/Rate Route Frequency Ordered Stop   12/16/18 0900  metroNIDAZOLE (FLAGYL) IVPB 500 mg     500 mg 100 mL/hr over 60 Minutes Intravenous Every 8 hours 12/16/18 0843     12/16/18 0845  ceFEPIme (MAXIPIME) 2 g in sodium chloride 0.9 % 100 mL IVPB     2 g 200 mL/hr over 30 Minutes Intravenous Every 8 hours 12/16/18 0843     12/15/18 1315  vancomycin (VANCOCIN) IVPB 1000 mg/200 mL premix  Status:  Discontinued     1,000 mg 200 mL/hr over 60 Minutes Intravenous Every 1 hr x 2 12/15/18 1307 12/15/18 1514      Assessment/Plan: Patient diagnosed with osteomyelitis of right third toe following recent MRI. Swelling, erythema have improved with IV antibiotics following admission yesterdary. Consulted with hospitalist and recommended continued management with IV antibiotics via PICC line. Will hold off on surgery for now.  - Re-evaluate toe in 2 weeks and consider amputation depending on progression     LOS: 2 days    Gerlean Ren 12/17/2018

## 2018-12-18 LAB — GLUCOSE, CAPILLARY
Glucose-Capillary: 139 mg/dL — ABNORMAL HIGH (ref 70–99)
Glucose-Capillary: 170 mg/dL — ABNORMAL HIGH (ref 70–99)
Glucose-Capillary: 175 mg/dL — ABNORMAL HIGH (ref 70–99)
Glucose-Capillary: 182 mg/dL — ABNORMAL HIGH (ref 70–99)
Glucose-Capillary: 182 mg/dL — ABNORMAL HIGH (ref 70–99)
Glucose-Capillary: 228 mg/dL — ABNORMAL HIGH (ref 70–99)
Glucose-Capillary: 252 mg/dL — ABNORMAL HIGH (ref 70–99)

## 2018-12-18 MED ORDER — CHLORHEXIDINE GLUCONATE CLOTH 2 % EX PADS
6.0000 | MEDICATED_PAD | Freq: Every day | CUTANEOUS | Status: DC
  Administered 2018-12-18: 15:00:00 6 via TOPICAL

## 2018-12-18 MED ORDER — SODIUM CHLORIDE 0.9% FLUSH: 10.0000 mL | INTRAVENOUS | Status: DC | PRN

## 2018-12-18 MED ORDER — SODIUM CHLORIDE 0.9% FLUSH
10.0000 mL | Freq: Two times a day (BID) | INTRAVENOUS | Status: DC
  Administered 2018-12-18 – 2018-12-19 (×2): 10 mL

## 2018-12-18 NOTE — Progress Notes (Signed)
Inpatient Diabetes Program Recommendations  AACE/ADA: New Consensus Statement on Inpatient Glycemic Control   Target Ranges:  Prepandial:   less than 140 mg/dL      Peak postprandial:   less than 180 mg/dL (1-2 hours)      Critically ill patients:  140 - 180 mg/dL   Results for Fernando Williams, Fernando Williams (MRN [id number]) as of 12/18/2018 12:47  Ref. Range 12/17/2018 07:32 12/17/2018 10:22 12/17/2018 11:14 12/17/2018 16:16 12/17/2018 20:12 12/17/2018 23:55 12/18/2018 04:12 12/18/2018 07:42 12/18/2018 12:03  Glucose-Capillary Latest Ref Range: 70 - 99 mg/dL 197 (H)  Novolog 2 units      Lantus 10 units 321 (H)  Novolog 7 units 271 (H)  Novolog 5 units 321 (H)  Novolog 7 units 182 (H)  Novolog 2 units 139 (H) 170 (H)  Novolog 2 units  Lantus 10 units 228 (H)   Review of Glycemic Control  Diabetes history:DM2 (dx in July 2020) Outpatient Diabetes medications:None Current orders for Inpatient glycemic control: Lantus 10 units daily,Novolog 0-9 units Q4H  Inpatient Diabetes Program Recommendations:  Insulin-Basal: Please consider increasing Lantus to 14 units daily.  Insulin-Meal Coverage: Please consider ordering Novolog 4 units TID with meals for meal coverage if patient eats at least 50% of meals.  HgbA1C:A1C 10.9% on 12/15/18 indicating an average glucose of 266 mg/dl over the past 2-3 months.Patient will need DM medications prescribed at discharge.  Thanks, Barnie Alderman, RN, MSN, CDE Diabetes Coordinator Inpatient Diabetes Program [phone] (Team Pager from 8am to 5pm)

## 2018-12-18 NOTE — Progress Notes (Signed)
Subjective: He has been admitted with osteomyelitis of the third toe on his right foot.  He has started on IV antibiotics and his toe looks much better.  He had culture done of the drainage from his toe and it shows gram-positive cocci and gram-negative rods but it has been reintubated so we do not have a diagnostic culture yet.  I would like to see what he grows so that we can adjust his antibiotics and hopefully find something that he will not have to take 3 times a day. Objective: Vital signs in last 24 hours: Temp:  [97.8 F (36.6 C)-98.4 F (36.9 C)] 97.8 F (36.6 C) (12/03 0415) Pulse Rate:  [60-78] 67 (12/03 0415) Resp:  [16-17] 16 (12/03 0415) BP: (117-135)/(65-78) 117/67 (12/03 0415) SpO2:  [97 %-98 %] 98 % (12/03 0415) Weight change:  Last BM Date: 12/17/18  Intake/Output from previous day: 12/02 0701 - 12/03 0700 In: 720 [P.O.:720] Out: 300 [Urine:300]  PHYSICAL EXAM General appearance: alert, cooperative and no distress Resp: clear to auscultation bilaterally Cardio: regular rate and rhythm, S1, S2 normal, no murmur, click, rub or gallop GI: soft, non-tender; bowel sounds normal; no masses,  no organomegaly Extremities: His toe looks much better  Lab Results:  Results for orders placed or performed during the hospital encounter of 12/15/18 (from the past 48 hour(s))  Aerobic Culture (superficial specimen)     Status: None (Preliminary result)   Collection Time: 12/16/18  8:13 AM   Specimen: Toe; Wound  Result Value Ref Range   Specimen Description      TOE RIGHT Performed at [facility], [address] [place], [place] [address]    Special Requests      NONE Performed at [facility], [address] [place], [place] [address]    Gram Stain      NO WBC SEEN ABUNDANT GRAM POSITIVE COCCI RARE GRAM POSITIVE RODS    Culture      CULTURE REINCUBATED FOR BETTER GROWTH Performed at [facility], [place] [address], [place], [place] [address]    Report Status  PENDING   Glucose, capillary     Status: Abnormal   Collection Time: 12/16/18 11:16 AM  Result Value Ref Range   Glucose-Capillary 309 (H) 70 - 99 mg/dL  Glucose, capillary     Status: Abnormal   Collection Time: 12/16/18  4:56 PM  Result Value Ref Range   Glucose-Capillary 283 (H) 70 - 99 mg/dL   Comment 1 Notify RN    Comment 2 Document in Chart   Glucose, capillary     Status: Abnormal   Collection Time: 12/16/18 10:03 PM  Result Value Ref Range   Glucose-Capillary 165 (H) 70 - 99 mg/dL  Glucose, capillary     Status: Abnormal   Collection Time: 12/17/18 12:59 AM  Result Value Ref Range   Glucose-Capillary 133 (H) 70 - 99 mg/dL  Glucose, capillary     Status: Abnormal   Collection Time: 12/17/18  4:54 AM  Result Value Ref Range   Glucose-Capillary 180 (H) 70 - 99 mg/dL  Glucose, capillary     Status: Abnormal   Collection Time: 12/17/18  7:32 AM  Result Value Ref Range   Glucose-Capillary 197 (H) 70 - 99 mg/dL   Comment 1 QC Due   Glucose, capillary     Status: Abnormal   Collection Time: 12/17/18 11:14 AM  Result Value Ref Range   Glucose-Capillary 321 (H) 70 - 99 mg/dL  Glucose, capillary  Status: Abnormal   Collection Time: 12/17/18  4:16 PM  Result Value Ref Range   Glucose-Capillary 271 (H) 70 - 99 mg/dL  Glucose, capillary     Status: Abnormal   Collection Time: 12/17/18  8:12 PM  Result Value Ref Range   Glucose-Capillary 321 (H) 70 - 99 mg/dL   Comment 1 Notify RN   Glucose, capillary     Status: Abnormal   Collection Time: 12/17/18 11:55 PM  Result Value Ref Range   Glucose-Capillary 182 (H) 70 - 99 mg/dL   Comment 1 Notify RN   Glucose, capillary     Status: Abnormal   Collection Time: 12/18/18  4:12 AM  Result Value Ref Range   Glucose-Capillary 139 (H) 70 - 99 mg/dL   Comment 1 Notify RN   Glucose, capillary     Status: Abnormal   Collection Time: 12/18/18  7:42 AM  Result Value Ref Range   Glucose-Capillary 170 (H) 70 - 99 mg/dL    ABGS No  results for input(s): PHART, PO2ART, TCO2, HCO3 in the last 72 hours.  Invalid input(s): PCO2 CULTURES Recent Results (from the past 240 hour(s))  Blood Cultures x 2 sites     Status: None (Preliminary result)   Collection Time: 12/15/18  2:06 PM   Specimen: BLOOD RIGHT ARM  Result Value Ref Range Status   Specimen Description BLOOD RIGHT ARM  Final   Special Requests   Final    BOTTLES DRAWN AEROBIC AND ANAEROBIC Blood Culture adequate volume   Culture   Final    NO GROWTH 3 DAYS Performed at [facility], [address], [place], [place] [address]    Report Status PENDING  Incomplete  Blood Cultures x 2 sites     Status: None (Preliminary result)   Collection Time: 12/15/18  2:17 PM   Specimen: BLOOD LEFT ARM  Result Value Ref Range Status   Specimen Description BLOOD LEFT ARM  Final   Special Requests   Final    BOTTLES DRAWN AEROBIC AND ANAEROBIC Blood Culture adequate volume   Culture   Final    NO GROWTH 3 DAYS Performed at [facility], [address], [place], [place] [address]    Report Status PENDING  Incomplete  Aerobic Culture (superficial specimen)     Status: None (Preliminary result)   Collection Time: 12/16/18  8:13 AM   Specimen: Toe; Wound  Result Value Ref Range Status   Specimen Description   Final    TOE RIGHT Performed at [facility], [address], [place], [place] [address]    Special Requests   Final    NONE Performed at [facility], [address], [place], [place] [address]    Gram Stain   Final    NO WBC SEEN ABUNDANT GRAM POSITIVE COCCI RARE GRAM POSITIVE RODS    Culture   Final    CULTURE REINCUBATED FOR BETTER GROWTH Performed at [facility], [place] [address], [place], [place] [address]    Report Status PENDING  Incomplete   Studies/Results: Mr Foot Right W Wo Contrast  Result Date: 12/16/2018 CLINICAL DATA:  Diabetic patient with pain and swelling of the right third toe. Pain and swelling began after the patient clipped is  toenails 3-4 weeks ago. EXAM: MRI OF THE RIGHT FOREFOOT WITHOUT AND WITH CONTRAST TECHNIQUE: Multiplanar, multisequence MR imaging of the right forefoot was performed before and after the administration of intravenous contrast. CONTRAST:  7 mL GADAVIST  IV COMPARISON:  Plain films of the right foot 12/15/2018. FINDINGS: Bones/Joint/Cartilage There is edema and enhancement throughout the middle and distal phalanges of the third toe consistent with osteomyelitis. Marrow signal is otherwise unremarkable. Ligaments Intact. Muscles and Tendons No intramuscular fluid collection or focal lesion. Soft tissues A fluid collection consistent with abscess just off the tuft of the distal phalanx of the third toe measures approximately 1 cm transverse by 0.7 cm craniocaudal by 0.4 cm long. Soft tissues of the third toe are edematous and enhancing. IMPRESSION: 1. Findings consistent with osteomyelitis throughout the middle and distal phalanges of the third toe. 2. Small abscess just off the tuft of the distal phalanx of the third toe. Electronically Signed   By: Inge Rise M.D.   On: 12/16/2018 11:57   US Arterial Abi (screening Lower Extremity)  Result Date: 12/16/2018 CLINICAL DATA:  58 year old male with right third toe wound and osteomyelitis. EXAM: NONINVASIVE PHYSIOLOGIC VASCULAR STUDY OF BILATERAL LOWER EXTREMITIES TECHNIQUE: Evaluation of both lower extremities were performed at rest, including calculation of ankle-brachial indices with single level Doppler, pressure and pulse volume recording. COMPARISON:  None. FINDINGS: Right ABI:  1.2 Left ABI:  1.1 Right Lower Extremity: Relatively normal posterior tibial arterial waveform. Mildly blunted biphasic dorsalis pedis waveform. Left Lower Extremity: Relatively normal posterior tibial arterial waveform. Blunted monophasic dorsalis pedis waveform. 1.0-1.4 Normal IMPRESSION: 1. The bilateral resting ankle-brachial indices are within normal limits. 2. Abnormal  dorsalis pedis arterial waveforms, worse on the left than the right. This suggests an element of underlying runoff (below the knee) disease. Patient may benefit from referral to Interventional Radiology or other endovascular specialist. Signed, Criselda Peaches, MD, [place] [facility] Radiology Electronically Signed   By: Jacqulynn Cadet M.D.   On: 12/16/2018 12:41   [place] Ekg Site Rite  Result Date: 12/17/2018 If Site Rite image not attached, placement could not be confirmed due to current cardiac rhythm.   Medications:  Prior to Admission:  Medications Prior to Admission  Medication Sig Dispense Refill Last Dose  . albuterol (PROVENTIL HFA;VENTOLIN HFA) 108 (90 Base) MCG/ACT inhaler Inhale 2 puffs into the lungs 3 (three) times daily. (Patient taking differently: Inhale 2 puffs into the lungs every 6 (six) hours as needed for wheezing or shortness of breath. ) 1 Inhaler 1 unknown  . aspirin EC 81 MG EC tablet Take 1 tablet (81 mg total) by mouth daily. 30 tablet 6 12/15/2018 at Unknown time  . atorvastatin (LIPITOR) 20 MG tablet Take 1 tablet (20 mg total) by mouth daily at 6 PM. 30 tablet 6 12/14/2018 at Unknown time  . B Complex-C-Folic Acid (SUPER B COMPLEX/FA/VIT C PO) Take 1 tablet by mouth daily.    12/14/2018 at Unknown time  . carvedilol (COREG) 6.25 MG tablet Take 1 tablet (6.25 mg total) by mouth 2 (two) times daily with a meal. 180 tablet 3 12/15/2018 at 643  . digoxin (LANOXIN) 0.125 MG tablet Take 1 tablet (0.125 mg total) by mouth daily. 30 tablet 6 12/15/2018 at Unknown time  . furosemide (LASIX) 40 MG tablet Take 1 tablet (40 mg total) by mouth daily. 90 tablet 3 12/14/2018 at Unknown time  . glucosamine-chondroitin 500-400 MG tablet Take 2 tablets by mouth daily.    12/14/2018 at Unknown time  . Magnesium 250 MG TABS Take 1 tablet by mouth every evening.    12/14/2018 at Unknown time  . Multiple Vitamin (MULTIVITAMIN) tablet Take  1 tablet by mouth every evening.  12/14/2018 at Unknown time  . potassium chloride SA (KLOR-CON) 20 MEQ tablet Take 20 mEq by mouth daily.   12/15/2018 at Unknown time  . sacubitril-valsartan (ENTRESTO) 24-26 MG Take 1 tablet by mouth 2 (two) times daily. 60 tablet 11 12/15/2018 at Unknown time  . spironolactone (ALDACTONE) 25 MG tablet Take 1 tablet (25 mg total) by mouth daily. 30 tablet 6 12/14/2018 at Unknown time  . vitamin B-12 (CYANOCOBALAMIN) 500 MCG tablet Take 1,000 mcg by mouth daily.   12/14/2018 at Unknown time   Scheduled: . aspirin EC  81 mg Oral Daily  . atorvastatin  20 mg Oral q1800  . B-complex with vitamin C  1 tablet Oral Daily  . carvedilol  6.25 mg Oral BID WC  . digoxin  0.125 mg Oral Daily  . furosemide  40 mg Oral Daily  . insulin aspart  0-9 Units Subcutaneous Q4H  . insulin glargine  10 Units Subcutaneous Daily  . magnesium oxide  200 mg Oral QPM  . multivitamin with minerals  1 tablet Oral QPM  . potassium chloride SA  20 mEq Oral Daily  . sacubitril-valsartan  1 tablet Oral BID  . spironolactone  25 mg Oral Daily   Continuous: . ceFEPime (MAXIPIME) IV 2 g (12/18/18 0649)  . metronidazole 500 mg (12/18/18 [id number])   [id number] **OR** acetaminophen, albuterol, ondansetron **OR** ondansetron (ZOFRAN) IV, polyethylene glycol  Assesment: He has osteomyelitis of his right middle toe.  This is improving on current treatments.  He has diabetes and has not been taking medications at home so that will need to be started when he leaves the hospital  He has chronic systolic heart failure and his cardiac ejection fraction had improved Active Problems:   Toe osteomyelitis, right (HCC)    Plan: Continue IV antibiotics.  Await culture results.  Home with home IV antibiotics for a total of 6 weeks once we get the culture results and can decide what antibiotics he needs    LOS: 3 days   Alonza Bogus 12/18/2018, 8:07 AM

## 2018-12-18 NOTE — Progress Notes (Signed)
Subjective: Patient was admitted on 12/01 for osteomyelitis of right third toe following MRI. He endorses continued improvement of apperance of toe since starting IV antibiotics with reduced erythema and swelling. Notes that he has been able to walk without pain. Denies fever. States that he would like to go home.   Objective: Vital signs in last 24 hours: Temp:  [97.8 F (36.6 C)-98.4 F (36.9 C)] 97.8 F (36.6 C) (12/03 0415) Pulse Rate:  [60-78] 67 (12/03 0415) Resp:  [16-17] 16 (12/03 0415) BP: (117-135)/(65-78) 117/67 (12/03 0415) SpO2:  [97 %-98 %] 98 % (12/03 0415) Last BM Date: 12/17/18  Intake/Output from previous day: 12/02 0701 - 12/03 0700 In: 720 [P.O.:720] Out: 300 [Urine:300] Intake/Output this shift: No intake/output data recorded.  General appearance: alert, cooperative, no distress Extremities: 2+ right-sided dorsalis pedis and posterior tibialis pulses; mild purulent drainage of distal right third toe; mild erythema and swelling of toe also noted; base of toe and foot are spared   Lab Results:  Recent Labs    12/15/18 1406  WBC 12.1*  HGB 14.8  HCT 43.8  PLT 289   BMET Recent Labs    12/15/18 1406  NA 130*  K 4.6  CL 96*  CO2 26  GLUCOSE 275*  BUN 20  CREATININE 0.85  CALCIUM 9.2   PT/INR No results for input(s): LABPROT, INR in the last 72 hours.  Studies/Results: Mr Foot Right W Wo Contrast  Result Date: 12/16/2018 CLINICAL DATA:  Diabetic patient with pain and swelling of the right third toe. Pain and swelling began after the patient clipped is toenails 3-4 weeks ago. EXAM: MRI OF THE RIGHT FOREFOOT WITHOUT AND WITH CONTRAST TECHNIQUE: Multiplanar, multisequence MR imaging of the right forefoot was performed before and after the administration of intravenous contrast. CONTRAST:  7 mL GADAVIST  IV COMPARISON:  Plain films of the right foot 12/15/2018. FINDINGS: Bones/Joint/Cartilage There is edema and enhancement throughout the middle and  distal phalanges of the third toe consistent with osteomyelitis. Marrow signal is otherwise unremarkable. Ligaments Intact. Muscles and Tendons No intramuscular fluid collection or focal lesion. Soft tissues A fluid collection consistent with abscess just off the tuft of the distal phalanx of the third toe measures approximately 1 cm transverse by 0.7 cm craniocaudal by 0.4 cm long. Soft tissues of the third toe are edematous and enhancing. IMPRESSION: 1. Findings consistent with osteomyelitis throughout the middle and distal phalanges of the third toe. 2. Small abscess just off the tuft of the distal phalanx of the third toe. Electronically Signed   By: Inge Rise M.D.   On: 12/16/2018 11:57   US Arterial Abi (screening Lower Extremity)  Result Date: 12/16/2018 CLINICAL DATA:  58 year old male with right third toe wound and osteomyelitis. EXAM: NONINVASIVE PHYSIOLOGIC VASCULAR STUDY OF BILATERAL LOWER EXTREMITIES TECHNIQUE: Evaluation of both lower extremities were performed at rest, including calculation of ankle-brachial indices with single level Doppler, pressure and pulse volume recording. COMPARISON:  None. FINDINGS: Right ABI:  1.2 Left ABI:  1.1 Right Lower Extremity: Relatively normal posterior tibial arterial waveform. Mildly blunted biphasic dorsalis pedis waveform. Left Lower Extremity: Relatively normal posterior tibial arterial waveform. Blunted monophasic dorsalis pedis waveform. 1.0-1.4 Normal IMPRESSION: 1. The bilateral resting ankle-brachial indices are within normal limits. 2. Abnormal dorsalis pedis arterial waveforms, worse on the left than the right. This suggests an element of underlying runoff (below the knee) disease. Patient may benefit from referral to Interventional Radiology or other endovascular specialist. Signed, Antonietta Jewel.  Laurence Ferrari, MD, [place] [facility] Radiology Electronically Signed   By: Jacqulynn Cadet M.D.   On:  12/16/2018 12:41   [place] Ekg Site Rite  Result Date: 12/17/2018 If Site Rite image not attached, placement could not be confirmed due to current cardiac rhythm.   Anti-infectives: Anti-infectives (From admission, onward)   Start     Dose/Rate Route Frequency Ordered Stop   12/16/18 0900  metroNIDAZOLE (FLAGYL) IVPB 500 mg     500 mg 100 mL/hr over 60 Minutes Intravenous Every 8 hours 12/16/18 0843     12/16/18 0845  ceFEPIme (MAXIPIME) 2 g in sodium chloride 0.9 % 100 mL IVPB     2 g 200 mL/hr over 30 Minutes Intravenous Every 8 hours 12/16/18 0843     12/15/18 1315  vancomycin (VANCOCIN) IVPB 1000 mg/200 mL premix  Status:  Discontinued     1,000 mg 200 mL/hr over 60 Minutes Intravenous Every 1 hr x 2 12/15/18 1307 12/15/18 1514      Assessment/Plan: Patient diagnosed with osteomyelitis of right third toe. Swelling, erythema, and drainage have improved with IV antibiotics since admission. Recommended continued management with IV antibiotics via PICC line. Holding off on surgery to monitor response to medication.  - Culture collected, awaiting results. Patient will be discharged with IV antibiotics as informed by culture results.  - Re-evaluate in 2 weeks and consider amputation     LOS: 3 days    Gerlean Ren 12/18/2018

## 2018-12-18 NOTE — Progress Notes (Signed)
Peripherally Inserted Central Catheter/Midline Placement  The IV Nurse has discussed with the patient and/or persons authorized to consent for the patient, the purpose of this procedure and the potential benefits and risks involved with this procedure.  The benefits include less needle sticks, lab draws from the catheter, and the patient may be discharged home with the catheter. Risks include, but not limited to, infection, bleeding, blood clot (thrombus formation), and puncture of an artery; nerve damage and irregular heartbeat and possibility to perform a PICC exchange if needed/ordered by physician.  Alternatives to this procedure were also discussed.  Bard Power PICC patient education guide, fact sheet on infection prevention and patient information card has been provided to patient /or left at bedside.    PICC/Midline Placement Documentation  PICC Single Lumen [id number] PICC Right Basilic 41 cm 0 cm (Active)  Indication for Insertion or Continuance of Line Home intravenous therapies (PICC only) 12/18/18 1337  Exposed Catheter (cm) 0 cm 12/18/18 1337  Site Assessment Clean;Dry;Intact 12/18/18 1337  Line Status Flushed;Blood return noted 12/18/18 1337  Dressing Type Transparent 12/18/18 1337  Dressing Status Clean;Dry;Intact;Antimicrobial disc in place 12/18/18 1337  Dressing Intervention New dressing 12/18/18 1337  Dressing Change Due 12/25/18 12/18/18 1337       Christella Noa Albarece 12/18/2018, 1:38 PM

## 2018-12-18 NOTE — TOC Progression Note (Signed)
Transition of Care [facility]) - Progression Note    Patient Details  Name: Fernando Williams MRN: [id number] Date of Birth: [birth year]  [facility] CM/SW Contact  Ean Gettel, Chauncey Reading, RN Phone Number: 12/18/2018, 3:04 PM  Clinical Narrative:    Dr. Luan Pulling will follow patient until December 18th for IV antibiotics, then patient will be followed by Dr. Arnoldo Morale and will have a follow up appt in 2-3 weeks.  Patient will establish primary care with Dr. Benny Lennert in February.    Expected Discharge Plan: [place] Barriers to Discharge: Continued Medical Work up  Expected Discharge Plan and Services Expected Discharge Plan: [place]   Discharge Planning Services: CM Consult Post Acute Care Choice: Home Health                             HH Arranged: IV Antibiotics HH Agency: Other - See comment([facility])         Social Determinants of Health (SDOH) Interventions    Readmission Risk Interventions No flowsheet data found.

## 2018-12-19 LAB — GLUCOSE, CAPILLARY
Glucose-Capillary: 151 mg/dL — ABNORMAL HIGH (ref 70–99)
Glucose-Capillary: 187 mg/dL — ABNORMAL HIGH (ref 70–99)
Glucose-Capillary: 262 mg/dL — ABNORMAL HIGH (ref 70–99)

## 2018-12-19 MED ORDER — METRONIDAZOLE 500 MG PO TABS: 500.0000 mg | ORAL_TABLET | Freq: Three times a day (TID) | ORAL | 0 refills | Status: AC

## 2018-12-19 MED ORDER — LEVOFLOXACIN 500 MG PO TABS
750.0000 mg | ORAL_TABLET | Freq: Every day | ORAL | Status: DC
  Administered 2018-12-19: 11:00:00 750 mg via ORAL
  Filled 2018-12-19: qty 2

## 2018-12-19 MED ORDER — METRONIDAZOLE 500 MG PO TABS
500.0000 mg | ORAL_TABLET | Freq: Three times a day (TID) | ORAL | Status: DC
  Administered 2018-12-19: 11:00:00 500 mg via ORAL
  Filled 2018-12-19: qty 1

## 2018-12-19 MED ORDER — VANCOMYCIN HCL 10 G IV SOLR
1250.0000 mg | Freq: Two times a day (BID) | INTRAVENOUS | Status: DC
  Filled 2018-12-19 (×6): qty 1250

## 2018-12-19 MED ORDER — METFORMIN HCL 500 MG PO TABS: 1000.0000 mg | ORAL_TABLET | Freq: Two times a day (BID) | ORAL | 11 refills | Status: DC

## 2018-12-19 MED ORDER — VANCOMYCIN HCL 10 G IV SOLR: 1250.0000 mg | Freq: Two times a day (BID) | INTRAVENOUS | 0 refills | Status: DC

## 2018-12-19 MED ORDER — VANCOMYCIN IV (FOR PTA / DISCHARGE USE ONLY): 1250.0000 mg | Freq: Two times a day (BID) | INTRAVENOUS | 0 refills | Status: AC

## 2018-12-19 MED ORDER — LEVOFLOXACIN 750 MG PO TABS: 750.0000 mg | ORAL_TABLET | Freq: Every day | ORAL | 0 refills | Status: AC

## 2018-12-19 MED ORDER — HEPARIN SOD (PORK) LOCK FLUSH 100 UNIT/ML IV SOLN: 250.0000 [IU] | INTRAVENOUS | Status: DC | PRN

## 2018-12-19 MED ORDER — JARDIANCE 10 MG PO TABS: 10.0000 mg | ORAL_TABLET | Freq: Every day | ORAL | 5 refills | Status: DC

## 2018-12-19 MED ORDER — VANCOMYCIN HCL IN DEXTROSE 1-5 GM/200ML-% IV SOLN
1000.0000 mg | INTRAVENOUS | Status: AC
  Administered 2018-12-19 (×2): 1000 mg via INTRAVENOUS
  Filled 2018-12-19 (×2): qty 200

## 2018-12-19 NOTE — Progress Notes (Signed)
Subjective: We have culture results now.  I am awaiting help from pharmacy/infectious disease on what antibiotics to send him home on.  I think he should be able to be discharged today  Objective: Vital signs in last 24 hours: Temp:  [98.1 F (36.7 C)-98.2 F (36.8 C)] 98.1 F (36.7 C) (12/04 0404) Pulse Rate:  [60-71] 71 (12/04 0404) Resp:  [17] 17 (12/04 0404) BP: (111-126)/(68-70) 111/70 (12/04 0404) SpO2:  [97 %-99 %] 99 % (12/04 0404) Weight change:  Last BM Date: 12/17/18  Intake/Output from previous day: 12/03 0701 - 12/04 0700 In: 24555 [P.O.:24555] Out: -   PHYSICAL EXAM General appearance: alert, cooperative and no distress Resp: clear to auscultation bilaterally Cardio: regular rate and rhythm, S1, S2 normal, no murmur, click, rub or gallop GI: soft, non-tender; bowel sounds normal; no masses,  no organomegaly Extremities: extremities normal, atraumatic, no cyanosis or edema  Lab Results:  Results for orders placed or performed during the hospital encounter of 12/15/18 (from the past 48 hour(s))  Glucose, capillary     Status: Abnormal   Collection Time: 12/17/18 11:14 AM  Result Value Ref Range   Glucose-Capillary 321 (H) 70 - 99 mg/dL  Glucose, capillary     Status: Abnormal   Collection Time: 12/17/18  4:16 PM  Result Value Ref Range   Glucose-Capillary 271 (H) 70 - 99 mg/dL  Glucose, capillary     Status: Abnormal   Collection Time: 12/17/18  8:12 PM  Result Value Ref Range   Glucose-Capillary 321 (H) 70 - 99 mg/dL   Comment 1 Notify RN   Glucose, capillary     Status: Abnormal   Collection Time: 12/17/18 11:55 PM  Result Value Ref Range   Glucose-Capillary 182 (H) 70 - 99 mg/dL   Comment 1 Notify RN   Glucose, capillary     Status: Abnormal   Collection Time: 12/18/18  4:12 AM  Result Value Ref Range   Glucose-Capillary 139 (H) 70 - 99 mg/dL   Comment 1 Notify RN   Glucose, capillary     Status: Abnormal   Collection Time: 12/18/18  7:42 AM   Result Value Ref Range   Glucose-Capillary 170 (H) 70 - 99 mg/dL  Glucose, capillary     Status: Abnormal   Collection Time: 12/18/18 12:03 PM  Result Value Ref Range   Glucose-Capillary 228 (H) 70 - 99 mg/dL  Glucose, capillary     Status: Abnormal   Collection Time: 12/18/18  4:58 PM  Result Value Ref Range   Glucose-Capillary 182 (H) 70 - 99 mg/dL   Comment 1 Notify RN    Comment 2 Document in Chart   Glucose, capillary     Status: Abnormal   Collection Time: 12/18/18  7:53 PM  Result Value Ref Range   Glucose-Capillary 252 (H) 70 - 99 mg/dL   Comment 1 Notify RN   Glucose, capillary     Status: Abnormal   Collection Time: 12/18/18 11:47 PM  Result Value Ref Range   Glucose-Capillary 175 (H) 70 - 99 mg/dL   Comment 1 Notify RN   Glucose, capillary     Status: Abnormal   Collection Time: 12/19/18  4:02 AM  Result Value Ref Range   Glucose-Capillary 151 (H) 70 - 99 mg/dL   Comment 1 Notify RN   Glucose, capillary     Status: Abnormal   Collection Time: 12/19/18  7:54 AM  Result Value Ref Range   Glucose-Capillary 187 (H) 70 - 99  mg/dL   Comment 1 Notify RN     ABGS No results for input(s): PHART, PO2ART, TCO2, HCO3 in the last 72 hours.  Invalid input(s): PCO2 CULTURES Recent Results (from the past 240 hour(s))  Blood Cultures x 2 sites     Status: None (Preliminary result)   Collection Time: 12/15/18  2:06 PM   Specimen: BLOOD RIGHT ARM  Result Value Ref Range Status   Specimen Description BLOOD RIGHT ARM  Final   Special Requests   Final    BOTTLES DRAWN AEROBIC AND ANAEROBIC Blood Culture adequate volume   Culture   Final    NO GROWTH 4 DAYS Performed at [facility], [address] [place], [place] [address]    Report Status PENDING  Incomplete  Blood Cultures x 2 sites     Status: None (Preliminary result)   Collection Time: 12/15/18  2:17 PM   Specimen: BLOOD LEFT ARM  Result Value Ref Range Status   Specimen Description BLOOD LEFT ARM  Final    Special Requests   Final    BOTTLES DRAWN AEROBIC AND ANAEROBIC Blood Culture adequate volume   Culture   Final    NO GROWTH 4 DAYS Performed at [facility], [address], [place], [place] [address]    Report Status PENDING  Incomplete  Aerobic Culture (superficial specimen)     Status: None (Preliminary result)   Collection Time: 12/16/18  8:13 AM   Specimen: Toe; Wound  Result Value Ref Range Status   Specimen Description   Final    TOE RIGHT Performed at [facility], [address], [place], [place] [address]    Special Requests   Final    NONE Performed at [facility], [address], [place], [place] [address]    Gram Stain   Final    NO WBC SEEN ABUNDANT GRAM POSITIVE COCCI RARE GRAM POSITIVE RODS Performed at [facility], [place] [address] [place], [place] [address]    Culture   Final    FEW ENTEROCOCCUS FAECALIS FEW STREPTOCOCCUS MITIS/ORALIS FEW STENOTROPHOMONAS MALTOPHILIA    Report Status PENDING  Incomplete   Organism ID, Bacteria ENTEROCOCCUS FAECALIS  Final   Organism ID, Bacteria STENOTROPHOMONAS MALTOPHILIA  Final      Susceptibility   Enterococcus faecalis - MIC*    AMPICILLIN <=2 SENSITIVE Sensitive     VANCOMYCIN 1 SENSITIVE Sensitive     GENTAMICIN SYNERGY SENSITIVE Sensitive     * FEW ENTEROCOCCUS FAECALIS   Stenotrophomonas maltophilia - MIC*    LEVOFLOXACIN 0.5 SENSITIVE Sensitive     TRIMETH/SULFA <=20 SENSITIVE Sensitive     * FEW STENOTROPHOMONAS MALTOPHILIA   Studies/Results: No results found.  Medications:  Prior to Admission:  Medications Prior to Admission  Medication Sig Dispense Refill Last Dose  . albuterol (PROVENTIL HFA;VENTOLIN HFA) 108 (90 Base) MCG/ACT inhaler Inhale 2 puffs into the lungs 3 (three) times daily. (Patient taking differently: Inhale 2 puffs into the lungs every 6 (six) hours as needed for wheezing or shortness of breath. ) 1 Inhaler 1 unknown  . aspirin EC 81 MG EC tablet Take 1 tablet (81 mg total) by  mouth daily. 30 tablet 6 12/15/2018 at Unknown time  . atorvastatin (LIPITOR) 20 MG tablet Take 1 tablet (20 mg total) by mouth daily at 6 PM. 30 tablet 6 12/14/2018 at Unknown time  . B Complex-C-Folic Acid (SUPER B COMPLEX/FA/VIT C PO) Take 1 tablet by mouth daily.    12/14/2018 at Unknown  time  . carvedilol (COREG) 6.25 MG tablet Take 1 tablet (6.25 mg total) by mouth 2 (two) times daily with a meal. 180 tablet 3 12/15/2018 at 643  . digoxin (LANOXIN) 0.125 MG tablet Take 1 tablet (0.125 mg total) by mouth daily. 30 tablet 6 12/15/2018 at Unknown time  . furosemide (LASIX) 40 MG tablet Take 1 tablet (40 mg total) by mouth daily. 90 tablet 3 12/14/2018 at Unknown time  . glucosamine-chondroitin 500-400 MG tablet Take 2 tablets by mouth daily.    12/14/2018 at Unknown time  . Magnesium 250 MG TABS Take 1 tablet by mouth every evening.    12/14/2018 at Unknown time  . Multiple Vitamin (MULTIVITAMIN) tablet Take 1 tablet by mouth every evening.    12/14/2018 at Unknown time  . potassium chloride SA (KLOR-CON) 20 MEQ tablet Take 20 mEq by mouth daily.   12/15/2018 at Unknown time  . sacubitril-valsartan (ENTRESTO) 24-26 MG Take 1 tablet by mouth 2 (two) times daily. 60 tablet 11 12/15/2018 at Unknown time  . spironolactone (ALDACTONE) 25 MG tablet Take 1 tablet (25 mg total) by mouth daily. 30 tablet 6 12/14/2018 at Unknown time  . vitamin B-12 (CYANOCOBALAMIN) 500 MCG tablet Take 1,000 mcg by mouth daily.   12/14/2018 at Unknown time   Scheduled: . aspirin EC  81 mg Oral Daily  . atorvastatin  20 mg Oral q1800  . B-complex with vitamin C  1 tablet Oral Daily  . carvedilol  6.25 mg Oral BID WC  . Chlorhexidine Gluconate Cloth  6 each Topical Daily  . digoxin  0.125 mg Oral Daily  . furosemide  40 mg Oral Daily  . insulin aspart  0-9 Units Subcutaneous Q4H  . insulin glargine  10 Units Subcutaneous Daily  . magnesium oxide  200 mg Oral QPM  . multivitamin with minerals  1 tablet Oral QPM  .  potassium chloride SA  20 mEq Oral Daily  . sacubitril-valsartan  1 tablet Oral BID  . sodium chloride flush  10-40 mL Intracatheter Q12H  . spironolactone  25 mg Oral Daily   Continuous: . ceFEPime (MAXIPIME) IV 2 g (12/18/18 2209)  . metronidazole 500 mg (12/19/18 0009)   [id number] **OR** acetaminophen, albuterol, ondansetron **OR** ondansetron (ZOFRAN) IV, polyethylene glycol, sodium chloride flush  Assesment: He has osteomyelitis of the toe which looks much better.  Discussed with Dr. Baxter Flattery and plans are going to be to send him home on oral Levaquin, oral Flagyl and IV vancomycin.  Per Dr. Baxter Flattery there is a new study out that shows that we may be able to get by with 3 weeks of treatment with IV.  She is going to arrange follow-up in the ID clinic.    He has heart failure which is improved  He has diabetes and has not been taking any medication at home and that will be started Active Problems:   Toe osteomyelitis, right [facility])    Plan: Pharmacy will help calculate a dose of vancomycin and given the first dose here and then he can be discharged home    LOS: 4 days   Alonza Bogus 12/19/2018, 8:20 AM

## 2018-12-19 NOTE — Progress Notes (Signed)
PHARMACY CONSULT NOTE FOR:  OUTPATIENT  PARENTERAL ANTIBIOTIC THERAPY (OPAT)  Indication: osteomyelitis- enterococcus faecalis wound cx Regimen: Vancomycin 1250 mg IV every 12 hours. Start date: 12/19/2018 @ 2200 End date: 01/09/2019 last day of therapy  IV antibiotic discharge orders are pended. To discharging provider:  please sign these orders via discharge navigator,  Select New Orders & click on the button choice - Manage This Unsigned Work.     Thank you for allowing pharmacy to be a part of this patient's care.  Ramond Craver 12/19/2018, 8:47 AM

## 2018-12-19 NOTE — TOC Transition Note (Addendum)
Transition of Care [facility]) - CM/SW Discharge Note   Patient Details  Name: Fernando Williams MRN: [id number] Date of Birth: 1961/01/04  Transition of Care [facility]) CM/SW Contact:  Alva Broxson, Chauncey Reading, RN Phone Number: 12/19/2018, 10:43 AM   Clinical Narrative:   Patient discharging today. IV antibiotics arranged with [facility]. Patient has had DC teaching. Will follow with Dr. Luan Pulling until 12/18, then Dr. Arnoldo Morale. Will also follow up with infectious disease also. Home health RN arranged by [facility].  Discussed with patient, all questions answered.     Final next level of care: [place] Barriers to Discharge: Barriers Resolved        Discharge Plan and Services   Discharge Planning Services: CM Consult Post Acute Care Choice: Home Health                    HH Arranged: IV Antibiotics, RN [place] Agency: Other - See comment([facility])        Social Determinants of Health (SDOH) Interventions     Readmission Risk Interventions No flowsheet data found.

## 2018-12-19 NOTE — Discharge Summary (Signed)
Physician Discharge Summary  Patient ID: Fernando Williams MRN: [id number] DOB/AGE: 59-Oct-1962 59 y.o. Primary Care Physician:Charday Capetillo, Percell Miller, MD Admit date: 12/15/2018 Discharge date: 12/19/2018    Discharge Diagnoses:   Active Problems:   Toe osteomyelitis, right (HCC) Chronic systolic heart failure Hypertension Hyperlipidemia Diabetes mellitus type 2 uncontrolled  Allergies as of 12/19/2018      Reactions   Erythromycin Nausea And Vomiting   "Oxygen level went down"   Penicillins Other (See Comments)   Did it involve swelling of the face/tongue/throat, SOB, or low BP? No Did it involve sudden or severe rash/hives, skin peeling, or any reaction on the inside of your mouth or nose? Unknown Did you need to seek medical attention at a hospital or doctor's office? Unknown When did it last happen?childhood If all above answers are "NO", may proceed with cephalosporin use. "[name]" - STATES THIS OCCURS WITH ALL  "[name]"      Medication List    TAKE these medications   albuterol 108 (90 Base) MCG/ACT inhaler Commonly known as: VENTOLIN HFA Inhale 2 puffs into the lungs 3 (three) times daily. What changed:   when to take this  reasons to take this   aspirin 81 MG EC tablet Take 1 tablet (81 mg total) by mouth daily.   atorvastatin 20 MG tablet Commonly known as: LIPITOR Take 1 tablet (20 mg total) by mouth daily at 6 PM.   carvedilol 6.25 MG tablet Commonly known as: COREG Take 1 tablet (6.25 mg total) by mouth 2 (two) times daily with a meal.   digoxin 0.125 MG tablet Commonly known as: LANOXIN Take 1 tablet (0.125 mg total) by mouth daily.   Entresto 24-26 MG Generic drug: sacubitril-valsartan Take 1 tablet by mouth 2 (two) times daily.   furosemide 40 MG tablet Commonly known as: LASIX Take 1 tablet (40 mg total) by mouth daily.   glucosamine-chondroitin 500-400 MG tablet Take 2 tablets by mouth daily.   Jardiance 10 MG Tabs  tablet Generic drug: empagliflozin Take 10 mg by mouth daily before breakfast.   levofloxacin 750 MG tablet Commonly known as: LEVAQUIN Take 1 tablet (750 mg total) by mouth daily for 21 days.   Magnesium 250 MG Tabs Take 1 tablet by mouth every evening.   metFORMIN 500 MG tablet Commonly known as: Glucophage Take 2 tablets (1,000 mg total) by mouth 2 (two) times daily with a meal.   metroNIDAZOLE 500 MG tablet Commonly known as: FLAGYL Take 1 tablet (500 mg total) by mouth every 8 (eight) hours for 21 days.   multivitamin tablet Take 1 tablet by mouth every evening.   potassium chloride SA 20 MEQ tablet Commonly known as: KLOR-CON Take 20 mEq by mouth daily.   spironolactone 25 MG tablet Commonly known as: ALDACTONE Take 1 tablet (25 mg total) by mouth daily.   SUPER B COMPLEX/FA/VIT C PO Take 1 tablet by mouth daily.   vancomycin  IVPB Inject 1,250 mg into the vein every 12 (twelve) hours for 21 days. Indication:  Osteomyelitis- enterococcus faecalis wound  First dose: 12/19/18 @ 2200 Last Day of Therapy:  01/09/19 Labs - Sunday/Monday:  CBC/D, BMP, and vancomycin trough. Labs - Thursday:  BMP and vancomycin trough Labs - Every other week:  ESR and CRP   vancomycin 1,250 mg in sodium chloride 0.9 % 250 mL Inject 1,250 mg into the vein every 12 (twelve) hours.   vitamin B-12 500 MCG tablet Commonly known as: CYANOCOBALAMIN Take 1,000 mcg  by mouth daily.            Home Infusion Instuctions  (From admission, onward)         Start     Ordered   12/19/18 0000  Home infusion instructions [facility] May follow [place] Dosing Protocol; May administer Cathflo as needed to maintain patency of vascular access device.; Flushing of vascular access device: per [facility] Protocol: 0.9% NaCl pre/post medica...    Question Answer Comment  Instructions May follow [place] Dosing Protocol   Instructions May administer Cathflo as needed to maintain patency of  vascular access device.   Instructions Flushing of vascular access device: per [facility] Protocol: 0.9% NaCl pre/post medication administration and prn patency; Heparin 100 u/ml, 30m for implanted ports and Heparin 10u/ml, 551mfor all other central venous catheters.   Instructions May follow [facility] Anaphylaxis Protocol for First Dose Administration in the home: 0.9% NaCl at 25-50 ml/hr to maintain IV access for protocol meds. Epinephrine 0.3 ml IV/IM PRN and Benadryl 25-50 IV/IM PRN s/s of anaphylaxis.   Instructions [facility] Infusion Coordinator (RN) to assist per patient IV care needs in the home PRN.      12/19/18 0856          Discharged Condition: Improved    Consults: General surgery infectious disease by phone  Significant Diagnostic Studies: Mr Foot Right W Wo Contrast  Result Date: 12/16/2018 CLINICAL DATA:  Diabetic patient with pain and swelling of the right third toe. Pain and swelling began after the patient clipped is toenails 3-4 weeks ago. EXAM: MRI OF THE RIGHT FOREFOOT WITHOUT AND WITH CONTRAST TECHNIQUE: Multiplanar, multisequence MR imaging of the right forefoot was performed before and after the administration of intravenous contrast. CONTRAST:  7 mL GADAVIST  IV COMPARISON:  Plain films of the right foot 12/15/2018. FINDINGS: Bones/Joint/Cartilage There is edema and enhancement throughout the middle and distal phalanges of the third toe consistent with osteomyelitis. Marrow signal is otherwise unremarkable. Ligaments Intact. Muscles and Tendons No intramuscular fluid collection or focal lesion. Soft tissues A fluid collection consistent with abscess just off the tuft of the distal phalanx of the third toe measures approximately 1 cm transverse by 0.7 cm craniocaudal by 0.4 cm long. Soft tissues of the third toe are edematous and enhancing. IMPRESSION: 1. Findings consistent with osteomyelitis throughout the middle and distal phalanges of the third toe. 2. Small abscess just  off the tuft of the distal phalanx of the third toe. Electronically Signed   By: ThInge Rise.D.   On: 12/16/2018 11:57   UsKorearterial Abi (screening Lower Extremity)  Result Date: 12/16/2018 CLINICAL DATA:  89ear old male with right third toe wound and osteomyelitis. EXAM: NONINVASIVE PHYSIOLOGIC VASCULAR STUDY OF BILATERAL LOWER EXTREMITIES TECHNIQUE: Evaluation of both lower extremities were performed at rest, including calculation of ankle-brachial indices with single level Doppler, pressure and pulse volume recording. COMPARISON:  None. FINDINGS: Right ABI:  1.2 Left ABI:  1.1 Right Lower Extremity: Relatively normal posterior tibial arterial waveform. Mildly blunted biphasic dorsalis pedis waveform. Left Lower Extremity: Relatively normal posterior tibial arterial waveform. Blunted monophasic dorsalis pedis waveform. 1.0-1.4 Normal IMPRESSION: 1. The bilateral resting ankle-brachial indices are within normal limits. 2. Abnormal dorsalis pedis arterial waveforms, worse on the left than the right. This suggests an element of underlying runoff (below the knee) disease. Patient may benefit from referral to Interventional Radiology or other endovascular specialist. Signed, HeCriselda PeachesMD, RPMayflower Villageascular and Interventional Radiology  Specialists [facility] Radiology Electronically Signed   By: Jacqulynn Cadet M.D.   On: 12/16/2018 12:41   Dg Foot Complete Right  Result Date: 12/15/2018 CLINICAL DATA:  Swelling of right middle toe EXAM: RIGHT FOOT COMPLETE - 3+ VIEW COMPARISON:  None. FINDINGS: There is swelling of the right third digit. The third distal phalanx is eroded. The DIP joint appears preserved. No additional significant osseous abnormality. IMPRESSION: Right third digit soft tissue swelling with erosion of the distal phalanx suspicious for osteomyelitis. Electronically Signed   By: Macy Mis M.D.   On: 12/15/2018 11:49   [place] Ekg Site Rite  Result Date: 12/17/2018 If Site  Rite image not attached, placement could not be confirmed due to current cardiac rhythm.   Lab Results: Basic Metabolic Panel: No results for input(s): NA, K, CL, CO2, GLUCOSE, BUN, CREATININE, CALCIUM, MG, PHOS in the last 72 hours. Liver Function Tests: No results for input(s): AST, ALT, ALKPHOS, BILITOT, PROT, ALBUMIN in the last 72 hours.   CBC: No results for input(s): WBC, NEUTROABS, HGB, HCT, MCV, PLT in the last 72 hours.  Recent Results (from the past 240 hour(s))  Blood Cultures x 2 sites     Status: None (Preliminary result)   Collection Time: 12/15/18  2:06 PM   Specimen: BLOOD RIGHT ARM  Result Value Ref Range Status   Specimen Description BLOOD RIGHT ARM  Final   Special Requests   Final    BOTTLES DRAWN AEROBIC AND ANAEROBIC Blood Culture adequate volume   Culture   Final    NO GROWTH 4 DAYS Performed at [facility] [address] [place], [place] [address]    Report Status PENDING  Incomplete  Blood Cultures x 2 sites     Status: None (Preliminary result)   Collection Time: 12/15/18  2:17 PM   Specimen: BLOOD LEFT ARM  Result Value Ref Range Status   Specimen Description BLOOD LEFT ARM  Final   Special Requests   Final    BOTTLES DRAWN AEROBIC AND ANAEROBIC Blood Culture adequate volume   Culture   Final    NO GROWTH 4 DAYS Performed at [facility], [address] [place], [place] [address]    Report Status PENDING  Incomplete  Aerobic Culture (superficial specimen)     Status: None (Preliminary result)   Collection Time: 12/16/18  8:13 AM   Specimen: Toe; Wound  Result Value Ref Range Status   Specimen Description   Final    TOE RIGHT Performed at [facility], [address] [place], [place] [address]    Special Requests   Final    NONE Performed at [facility], [address] [place], [place] [address]    Gram Stain   Final    NO WBC SEEN ABUNDANT GRAM POSITIVE COCCI RARE GRAM POSITIVE RODS Performed at [facility], [address], [place], [place] [address]    Culture   Final    FEW ENTEROCOCCUS FAECALIS FEW STREPTOCOCCUS MITIS/ORALIS FEW STENOTROPHOMONAS MALTOPHILIA    Report Status PENDING  Incomplete   Organism ID, Bacteria ENTEROCOCCUS FAECALIS  Final   Organism ID, Bacteria STENOTROPHOMONAS MALTOPHILIA  Final      Susceptibility   Enterococcus faecalis - MIC*    AMPICILLIN <=2 SENSITIVE Sensitive     VANCOMYCIN 1 SENSITIVE Sensitive     GENTAMICIN SYNERGY SENSITIVE Sensitive     * FEW ENTEROCOCCUS FAECALIS   Stenotrophomonas maltophilia - MIC*    LEVOFLOXACIN 0.5 SENSITIVE Sensitive  TRIMETH/SULFA <=20 SENSITIVE Sensitive     * FEW STENOTROPHOMONAS MALTOPHILIA     Hospital Course: This is a 58 year old who came to the emergency department because of a sore on his foot.  This is been present for about 2 weeks and got worse and started draining purulent material.  He was evaluated in the emergency room and his x-ray of his foot showed osteomyelitis which was confirmed by his MRI.  He had culture done and grew organisms as listed above.  Telephone consult with infectious disease was obtained and it was elected to send him home on vancomycin IV and Flagyl and Levaquin p.o.  He has not been taking anything for his diabetes because he says he was never told to start.  He was on insulin in the hospital well to try him on oral meds at home.  New evidence indicates that 21 days may be enough IV and p.o. antibiotics and he will follow-up with general surgery and in the infectious disease clinic  Discharge Exam: Blood pressure 111/70, pulse 71, temperature 98.1 F (36.7 C), resp. rate 17, height '5\' 9"'$  (1.753 m), weight 97.7 kg, SpO2 99 %. He is awake and alert.  His toe looks much better.  Chest is clear.  No edema  Disposition: Home with home health services  Discharge Instructions    Face-to-face encounter (required for Medicare/Medicaid patients)   Complete by: As directed    I Alonza Bogus certify that  this patient is under my care and that I, or a nurse practitioner or physician's assistant working with me, had a face-to-face encounter that meets the physician face-to-face encounter requirements with this patient on 12/19/2018. The encounter with the patient was in whole, or in part for the following medical condition(s) which is the primary reason for home health care (List medical condition): Osteomyelitis of the foot   The encounter with the patient was in whole, or in part, for the following medical condition, which is the primary reason for home health care: Osteomyelitis of toe   I certify that, based on my findings, the following services are medically necessary home health services: Nursing   Reason for Medically Necessary Home Health Services: Skilled Nursing- Change/Decline in Patient Status   My clinical findings support the need for the above services: Pain interferes with ambulation/mobility   Further, I certify that my clinical findings support that this patient is homebound due to: Open/draining pressure/stasis ulcer   Home Health   Complete by: As directed    To provide the following care/treatments: RN   This is for IV antibiotics for 3 weeks.  Line care per agency protocol.  Laboratory work per agency protocol   Home infusion instructions [place] May follow [place] Dosing Protocol; May administer Cathflo as needed to maintain patency of vascular access device.; Flushing of vascular access device: per [facility] Protocol: 0.9% NaCl pre/post medica...   Complete by: As directed    Instructions: May follow [place] Dosing Protocol   Instructions: May administer Cathflo as needed to maintain patency of vascular access device.   Instructions: Flushing of vascular access device: per [facility] Protocol: 0.9% NaCl pre/post medication administration and prn patency; Heparin 100 u/ml, 38m for implanted ports and Heparin 10u/ml, 55mfor all other central venous catheters.    Instructions: May follow [facility] Anaphylaxis Protocol for First Dose Administration in the home: 0.9% NaCl at 25-50 ml/hr to maintain IV access for protocol meds. Epinephrine 0.3 ml IV/IM PRN and Benadryl  25-50 IV/IM PRN s/s of anaphylaxis.   Instructions: [place] Infusion Coordinator (RN) to assist per patient IV care needs in the home PRN.      Follow-up Information    Corum, Rex Kras, MD Follow up on 02/25/2019.   Specialty: Family Medicine Why: You are scheduled for an appointment with Dr. Holly Bodily on Wednesday, February 10th at 10:00am. Contact information: [place] [address] [phone]        Aviva Signs, MD. Schedule an appointment as soon as possible for a visit on 01/01/2019.   Specialty: General Surgery Contact information: [id number] [address] [phone]           Signed: Alonza Bogus   12/19/2018, 8:57 AM

## 2018-12-20 LAB — CULTURE, BLOOD (ROUTINE X 2)
Culture: NO GROWTH
Culture: NO GROWTH
Special Requests: ADEQUATE
Special Requests: ADEQUATE

## 2018-12-20 LAB — AEROBIC CULTURE W GRAM STAIN (SUPERFICIAL SPECIMEN): Gram Stain: NONE SEEN

## 2018-12-21 ENCOUNTER — Other Ambulatory Visit (HOSPITAL_COMMUNITY)
Admission: AD | Admit: 2018-12-21 | Discharge: 2018-12-21 | Disposition: A | Discharge status: Home / Self Care | Payer: 59 | Source: Other Acute Inpatient Hospital | Attending: Pulmonary Disease | Admitting: Pulmonary Disease

## 2018-12-21 DIAGNOSIS — M869 Osteomyelitis, unspecified: Secondary | ICD-10-CM | POA: Diagnosis present

## 2018-12-21 DIAGNOSIS — B952 Enterococcus as the cause of diseases classified elsewhere: Secondary | ICD-10-CM | POA: Insufficient documentation

## 2018-12-21 LAB — CBC WITH DIFFERENTIAL/PLATELET
Abs Immature Granulocytes: 0.25 10*3/uL — ABNORMAL HIGH (ref 0.00–0.07)
Basophils Absolute: 0.1 10*3/uL (ref 0.0–0.1)
Basophils Relative: 1 %
Eosinophils Absolute: 0.4 10*3/uL (ref 0.0–0.5)
Eosinophils Relative: 3 %
HCT: 43.9 % (ref 39.0–52.0)
Hemoglobin: 14.9 g/dL (ref 13.0–17.0)
Immature Granulocytes: 2 %
Lymphocytes Relative: 18 %
Lymphs Abs: 2.7 10*3/uL (ref 0.7–4.0)
MCH: 32.7 pg (ref 26.0–34.0)
MCHC: 33.9 g/dL (ref 30.0–36.0)
MCV: 96.3 fL (ref 80.0–100.0)
Monocytes Absolute: 1.1 10*3/uL — ABNORMAL HIGH (ref 0.1–1.0)
Monocytes Relative: 8 %
Neutro Abs: 10 10*3/uL — ABNORMAL HIGH (ref 1.7–7.7)
Neutrophils Relative %: 68 %
Platelets: 286 10*3/uL (ref 150–400)
RBC: 4.56 MIL/uL (ref 4.22–5.81)
RDW: 11.4 % — ABNORMAL LOW (ref 11.5–15.5)
WBC: 14.5 10*3/uL — ABNORMAL HIGH (ref 4.0–10.5)
nRBC: 0 % (ref 0.0–0.2)

## 2018-12-21 LAB — VANCOMYCIN, TROUGH: Vancomycin Tr: 17 ug/mL (ref 15–20)

## 2018-12-21 LAB — BASIC METABOLIC PANEL
Anion gap: 11 (ref 5–15)
BUN: 19 mg/dL (ref 6–20)
CO2: 24 mmol/L (ref 22–32)
Calcium: 7.7 mg/dL — ABNORMAL LOW (ref 8.9–10.3)
Chloride: 95 mmol/L — ABNORMAL LOW (ref 98–111)
Creatinine, Ser: 1.09 mg/dL (ref 0.61–1.24)
GFR calc Af Amer: >60 mL/min (ref >60)
GFR calc non Af Amer: >60 mL/min (ref >60)
Glucose, Bld: 137 mg/dL — ABNORMAL HIGH (ref 70–99)
Potassium: 3.5 mmol/L (ref 3.5–5.1)
Sodium: 130 mmol/L — ABNORMAL LOW (ref 135–145)

## 2018-12-22 ENCOUNTER — Encounter (HOSPITAL_COMMUNITY): Payer: Self-pay

## 2018-12-22 ENCOUNTER — Ambulatory Visit (HOSPITAL_COMMUNITY)
Admission: RE | Admit: 2018-12-22 | Discharge: 2018-12-22 | Disposition: A | Discharge status: Home / Self Care | Payer: 59 | Source: Ambulatory Visit | Attending: Cardiology | Admitting: Cardiology

## 2018-12-22 ENCOUNTER — Other Ambulatory Visit: Payer: Self-pay

## 2018-12-22 VITALS — BP 118/68 | HR 86 | Wt 216.4 lb

## 2018-12-22 DIAGNOSIS — M86071 Acute hematogenous osteomyelitis, right ankle and foot: Secondary | ICD-10-CM | POA: Insufficient documentation

## 2018-12-22 DIAGNOSIS — E1122 Type 2 diabetes mellitus with diabetic chronic kidney disease: Secondary | ICD-10-CM | POA: Insufficient documentation

## 2018-12-22 DIAGNOSIS — I428 Other cardiomyopathies: Secondary | ICD-10-CM | POA: Diagnosis not present

## 2018-12-22 DIAGNOSIS — Z79899 Other long term (current) drug therapy: Secondary | ICD-10-CM | POA: Diagnosis not present

## 2018-12-22 DIAGNOSIS — Z7984 Long term (current) use of oral hypoglycemic drugs: Secondary | ICD-10-CM | POA: Insufficient documentation

## 2018-12-22 DIAGNOSIS — Z87891 Personal history of nicotine dependence: Secondary | ICD-10-CM | POA: Insufficient documentation

## 2018-12-22 DIAGNOSIS — Z792 Long term (current) use of antibiotics: Secondary | ICD-10-CM | POA: Diagnosis not present

## 2018-12-22 DIAGNOSIS — Z7982 Long term (current) use of aspirin: Secondary | ICD-10-CM | POA: Diagnosis not present

## 2018-12-22 DIAGNOSIS — Z833 Family history of diabetes mellitus: Secondary | ICD-10-CM | POA: Insufficient documentation

## 2018-12-22 DIAGNOSIS — I5022 Chronic systolic (congestive) heart failure: Secondary | ICD-10-CM

## 2018-12-22 DIAGNOSIS — I2510 Atherosclerotic heart disease of native coronary artery without angina pectoris: Secondary | ICD-10-CM | POA: Insufficient documentation

## 2018-12-22 DIAGNOSIS — N182 Chronic kidney disease, stage 2 (mild): Secondary | ICD-10-CM | POA: Diagnosis not present

## 2018-12-22 DIAGNOSIS — E669 Obesity, unspecified: Secondary | ICD-10-CM | POA: Diagnosis not present

## 2018-12-22 DIAGNOSIS — I313 Pericardial effusion (noninflammatory): Secondary | ICD-10-CM | POA: Insufficient documentation

## 2018-12-22 DIAGNOSIS — Z809 Family history of malignant neoplasm, unspecified: Secondary | ICD-10-CM | POA: Diagnosis not present

## 2018-12-22 MED ORDER — ENTRESTO 49-51 MG PO TABS: 1.0000 | ORAL_TABLET | Freq: Two times a day (BID) | ORAL | 6 refills | Status: DC

## 2018-12-22 NOTE — Patient Instructions (Signed)
STOP Lasix STOP Digoxin INCREASE Entresto to 49/51 mg ,one tab twice daily  Labs needed in one week by home health  Your physician recommends that you schedule a follow-up appointment in: 3-4 weeks with Pharmacy Team  Your physician recommends that you schedule a follow-up appointment in: 8 weeks with Dr Haroldine Laws  Do the following things EVERYDAY: 1) Weigh yourself in the morning before breakfast. Write it down and keep it in a log. 2) Take your medicines as prescribed 3) Eat low salt foods-Limit salt (sodium) to 2000 mg per day.  4) Stay as active as you can everyday 5) Limit all fluids for the day to less than 2 liters  At the [facility], you and your health needs are our priority. As part of our continuing mission to provide you with exceptional heart care, we have created designated Provider Care Teams. These Care Teams include your primary Cardiologist (physician) and Advanced Practice Providers (APPs- Physician Assistants and Nurse Practitioners) who all work together to provide you with the care you need, when you need it.   You may see any of the following providers on your designated Care Team at your next follow up: Marland Kitchen Dr Glori Bickers . Dr Loralie Champagne . Darrick Grinder, NP . Lyda Jester, PA . Audry Riles, PharmD   Please be sure to bring in all your medications bottles to every appointment.

## 2018-12-22 NOTE — Progress Notes (Signed)
ADVANCED HF CLINIC NOTE PCP: Dr Luan Pulling Primary HF Cardiologist: Dr Haroldine Laws   HPI: Fernando Williams a 58 y.o.malewith hx of tobacco abuse, SOB, obesity and new onset systolic HF diagnosed in July 2020.   Admitted with increased dyspnea and volume overload in the setting of acute systolic heart failure. Diuresed > 20 pounds and underwent cath that showed NICM, min obstructive cad, and mild-mod elevated filling pressures. CMRI completed and showed LVEF 17% , RV EF 28% and LGE in basal septum with possible viral myocarditis. HF medications initiated.   Last clinic visit 10/2018, he was doing well. Was symptomatically much improved, NYHA I. Losartan changed to Entresto. Echo was repeated at visit and EF was 25-30%.     Recently admitted to [facility] 11/30-12/4 for rt toe osteomyelitis and treated w/ IV antibiotics. Was discharged home w/ PICC and plan to is continue abx x 3 wks. On vancomycin. He was also started on Jardiance during hospitalization.   Presents to clinic for HF f/u. Doing well from a HF standpoint. Wt stable. 216 lb today (215 lb at last visit). BP 118/68. Pulse 86 bpm. No symptoms. Denies exertional dyspnea. No orthopnea/ PND or LEE. Denies palpitations, dizziness, syncope/ near syncope. Tolerating meds ok, except for mild GI upset w/ Metformin. Followed closely by home health given IV abx. Denies fever and chills. No toe pain. Had BMP done yesterday. I have personally reviewed results. SCr 1.09. K 3.5.    ECHO 7/1/20202 EF 15-20%   Echo 11/12/2018  25 to 30%   CMRI 07/23/2018  1.  Severely dilated LV with EF 17%, diffuse hypokinesis. 2. Mildly dilated RV with EF 28%, moderate to severe systolic dysfunction. 3. Primarily mid-wall LGE in the basal septum, but also rim of subendocardial LGE in the basal inferolateral wall. This is probably a non-coronary pattern, possible prior viral myocarditis.  ROS: All systems negative except as listed in HPI, PMH and Problem List.   SH:  Social History   Socioeconomic History  . Marital status: Single    Spouse name: Not on file  . Number of children: Not on file  . Years of education: Not on file  . Highest education level: Not on file  Occupational History  . Not on file  Social Needs  . Financial resource strain: Not on file  . Food insecurity    Worry: Not on file    Inability: Not on file  . Transportation needs    Medical: Not on file    Non-medical: Not on file  Tobacco Use  . Smoking status: Former Smoker    Types: Cigars    Quit date: 04/07/2018    Years since quitting: 0.7  . Smokeless tobacco: Never Used  Substance and Sexual Activity  . Alcohol use: No  . Drug use: No  . Sexual activity: Yes    Birth control/protection: None  Lifestyle  . Physical activity    Days per week: Not on file    Minutes per session: Not on file  . Stress: Not on file  Relationships  . Social Herbalist on phone: Not on file    Gets together: Not on file    Attends religious service: Not on file    Active member of club or organization: Not on file    Attends meetings of clubs or organizations: Not on file    Relationship status: Not on file  . Intimate partner violence    Fear of current or  ex partner: Not on file    Emotionally abused: Not on file    Physically abused: Not on file    Forced sexual activity: Not on file  Other Topics Concern  . Not on file  Social History Narrative  . Not on file    FH:  Family History  Problem Relation Age of Onset  . Diabetes Mother   . Cancer Father     Past Medical History:  Diagnosis Date  . CHF (congestive heart failure) ([place])   . Pneumonia     Current Outpatient Medications  Medication Sig Dispense Refill  . albuterol (PROVENTIL HFA;VENTOLIN HFA) 108 (90 Base) MCG/ACT inhaler Inhale 2 puffs into the lungs 3 (three) times daily. 1 Inhaler 1  . aspirin EC 81 MG EC tablet Take 1 tablet (81 mg total) by mouth daily. 30 tablet 6  .  atorvastatin (LIPITOR) 20 MG tablet Take 1 tablet (20 mg total) by mouth daily at 6 PM. 30 tablet 6  . B Complex-C-Folic Acid (SUPER B COMPLEX/FA/VIT C PO) Take 1 tablet by mouth daily.     . carvedilol (COREG) 6.25 MG tablet Take 1 tablet (6.25 mg total) by mouth 2 (two) times daily with a meal. 180 tablet 3  . empagliflozin (JARDIANCE) 10 MG TABS tablet Take 10 mg by mouth daily before breakfast. 30 tablet 5  . glucosamine-chondroitin 500-400 MG tablet Take 2 tablets by mouth daily.     Marland Kitchen levofloxacin (LEVAQUIN) 750 MG tablet Take 1 tablet (750 mg total) by mouth daily for 21 days. 21 tablet 0  . Magnesium 250 MG TABS Take 1 tablet by mouth every evening.     . metFORMIN (GLUCOPHAGE) 500 MG tablet Take 2 tablets (1,000 mg total) by mouth 2 (two) times daily with a meal. 120 tablet 11  . metroNIDAZOLE (FLAGYL) 500 MG tablet Take 1 tablet (500 mg total) by mouth every 8 (eight) hours for 21 days. 63 tablet 0  . Multiple Vitamin (MULTIVITAMIN) tablet Take 1 tablet by mouth every evening.     . potassium chloride SA (KLOR-CON) 20 MEQ tablet Take 20 mEq by mouth daily.    Marland Kitchen spironolactone (ALDACTONE) 25 MG tablet Take 1 tablet (25 mg total) by mouth daily. 30 tablet 6  . vancomycin 1,250 mg in sodium chloride 0.9 % 250 mL Inject 1,250 mg into the vein every 12 (twelve) hours. 42 Dose 0  . vancomycin IVPB Inject 1,250 mg into the vein every 12 (twelve) hours for 21 days. Indication:  Osteomyelitis- enterococcus faecalis wound  First dose: 12/19/18 @ 2200 Last Day of Therapy:  01/09/19 Labs - Sunday/Monday:  CBC/D, BMP, and vancomycin trough. Labs - Thursday:  BMP and vancomycin trough Labs - Every other week:  ESR and CRP 43 Units 0  . vitamin B-12 (CYANOCOBALAMIN) 500 MCG tablet Take 1,000 mcg by mouth daily.    . sacubitril-valsartan (ENTRESTO) 49-51 MG Take 1 tablet by mouth 2 (two) times daily. 60 tablet 6   No current facility-administered medications for this encounter.     Vitals:    12/22/18 0859  BP: 118/68  Pulse: 86  SpO2: 97%  Weight: 98.2 kg (216 lb 6.4 oz)   Wt Readings from Last 3 Encounters:  12/22/18 98.2 kg (216 lb 6.4 oz)  12/15/18 97.7 kg (215 lb 6.2 oz)  11/12/18 97.7 kg (215 lb 4.8 oz)    PHYSICAL EXAM: PHYSICAL EXAM: General:  Well appearing middle aged WM. No respiratory difficulty HEENT: normal Neck: supple.  no JVD. Carotids 2+ bilat; no bruits. No lymphadenopathy or thyromegaly appreciated. Cor: PMI nondisplaced. Regular rate & rhythm. No rubs, gallops or murmurs. Lungs: clear Abdomen: soft, nontender, nondistended. No hepatosplenomegaly. No bruits or masses. Good bowel sounds. Extremities: no cyanosis, clubbing, rash, edema. PICC in RUE Neuro: alert & oriented x 3, cranial nerves grossly intact. moves all 4 extremities w/o difficulty. Affect pleasant.   ASSESSMENT & PLAN: 1. Chronic Systolic HF with biventricular dysfunction - Echo 07/16/18 EF 15-20%Restricitve filling pattern RVEF mild to moderately depressed There was moderate pericardial effusion - RHC/LHC mild non obs CAD, severe NICM, elevated filling pressures.  - 07/22/2018 CMRI completed and showed LVEF 17% , RV EF 28% and LGE in basal septum with possible viral myocarditis.  - repeat echo 10/20 w/ EF 25-30% (mild improvement) - Symptomatically much improved NYHA I .  - Will stop digoxin.  - Continue coreg 6.25 mg tiwce a day.     - Continue spiro 58m daily. -Increase Entresto to 49/51 mg bid - Started on Jardiance recent admit. Tolerating ok.   - Repeat BMP again in 7 days. Personally reviewed BMP done yesterday (SCr and K stable)  - Refuses lifeVest.  - EF improving. No ICD yet until meds fully titrated. Will also need resolution of infection prior to potential implant (current treatment for toe osteomyelitis) .   2. CKD Stage II - personally reviewed BMP done yesterday. SCr stable on IV vanc + HF meds. Repeat again in 7 days (increasing Entresto dose).   3. DMII -  HGb A1C 8.6.  - Continue Jardiance 10 mg daily - Continue Metformin 1000 mg bid  - Advised to take Metformin w/ meals to reduce GI upset   4. CAD -Nonobstructive CAD . Mid LAD 40% stenoses, Prox RCA, Mid RCA 20%, Prox CX-Min CX 30 %.  - No s/s ischemia. Continue lipitor and aspirin.  5. [facility] Cult[facility] grew  Organism ID, Bacteria ENTEROCOCCUS FAECALIS   Organism ID, Bacteria STENOTROPHOMONAS MALTOPHILIA   Organism ID, Bacteria STREPTOCOCCUS MITIS/ORALIS   - Has PICC line. On IV vanc. Plan to continue abx x 3 weeks. He is being followed by ID.   F/u: return in 3 weeks for f/u w/ pharm D for further titration of HF regimen. F/u w/ Dr. BHaroldine Lawsin 8 wks.   Brittainy Simmons PA-C 9:30 AM

## 2018-12-25 ENCOUNTER — Other Ambulatory Visit (HOSPITAL_COMMUNITY)
Admission: RE | Admit: 2018-12-25 | Discharge: 2018-12-25 | Disposition: A | Discharge status: Home / Self Care | Payer: 59 | Source: Skilled Nursing Facility | Attending: Pulmonary Disease | Admitting: Pulmonary Disease

## 2018-12-25 DIAGNOSIS — M869 Osteomyelitis, unspecified: Secondary | ICD-10-CM | POA: Insufficient documentation

## 2018-12-25 DIAGNOSIS — B952 Enterococcus as the cause of diseases classified elsewhere: Secondary | ICD-10-CM | POA: Insufficient documentation

## 2018-12-25 LAB — CBC WITH DIFFERENTIAL/PLATELET
Abs Immature Granulocytes: 0.14 10*3/uL — ABNORMAL HIGH (ref 0.00–0.07)
Basophils Absolute: 0.1 10*3/uL (ref 0.0–0.1)
Basophils Relative: 1 %
Eosinophils Absolute: 0.5 10*3/uL (ref 0.0–0.5)
Eosinophils Relative: 4 %
HCT: 43 % (ref 39.0–52.0)
Hemoglobin: 14.1 g/dL (ref 13.0–17.0)
Immature Granulocytes: 1 %
Lymphocytes Relative: 17 %
Lymphs Abs: 1.8 10*3/uL (ref 0.7–4.0)
MCH: 32.2 pg (ref 26.0–34.0)
MCHC: 32.8 g/dL (ref 30.0–36.0)
MCV: 98.2 fL (ref 80.0–100.0)
Monocytes Absolute: 0.8 10*3/uL (ref 0.1–1.0)
Monocytes Relative: 7 %
Neutro Abs: 7.8 10*3/uL — ABNORMAL HIGH (ref 1.7–7.7)
Neutrophils Relative %: 70 %
Platelets: 268 10*3/uL (ref 150–400)
RBC: 4.38 MIL/uL (ref 4.22–5.81)
RDW: 11.7 % (ref 11.5–15.5)
WBC: 11 10*3/uL — ABNORMAL HIGH (ref 4.0–10.5)
nRBC: 0 % (ref 0.0–0.2)

## 2018-12-25 LAB — BASIC METABOLIC PANEL
Anion gap: 7 (ref 5–15)
BUN: 20 mg/dL (ref 6–20)
CO2: 26 mmol/L (ref 22–32)
Calcium: 9.1 mg/dL (ref 8.9–10.3)
Chloride: 100 mmol/L (ref 98–111)
Creatinine, Ser: 1.08 mg/dL (ref 0.61–1.24)
GFR calc Af Amer: >60 mL/min (ref >60)
GFR calc non Af Amer: >60 mL/min (ref >60)
Glucose, Bld: 164 mg/dL — ABNORMAL HIGH (ref 70–99)
Potassium: 5.3 mmol/L — ABNORMAL HIGH (ref 3.5–5.1)
Sodium: 133 mmol/L — ABNORMAL LOW (ref 135–145)

## 2018-12-25 LAB — VANCOMYCIN, TROUGH: Vancomycin Tr: 15 ug/mL (ref 15–20)

## 2018-12-28 ENCOUNTER — Other Ambulatory Visit (HOSPITAL_COMMUNITY)
Admission: AD | Admit: 2018-12-28 | Discharge: 2018-12-28 | Disposition: A | Discharge status: Home / Self Care | Payer: 59 | Source: Other Acute Inpatient Hospital | Attending: Pulmonary Disease | Admitting: Pulmonary Disease

## 2018-12-28 DIAGNOSIS — B952 Enterococcus as the cause of diseases classified elsewhere: Secondary | ICD-10-CM | POA: Diagnosis present

## 2018-12-28 DIAGNOSIS — M869 Osteomyelitis, unspecified: Secondary | ICD-10-CM | POA: Diagnosis present

## 2018-12-28 LAB — BASIC METABOLIC PANEL
Anion gap: 10 (ref 5–15)
BUN: 18 mg/dL (ref 6–20)
CO2: 27 mmol/L (ref 22–32)
Calcium: 9.2 mg/dL (ref 8.9–10.3)
Chloride: 97 mmol/L — ABNORMAL LOW (ref 98–111)
Creatinine, Ser: 0.96 mg/dL (ref 0.61–1.24)
GFR calc Af Amer: >60 mL/min (ref >60)
GFR calc non Af Amer: >60 mL/min (ref >60)
Glucose, Bld: 153 mg/dL — ABNORMAL HIGH (ref 70–99)
Potassium: 4.4 mmol/L (ref 3.5–5.1)
Sodium: 134 mmol/L — ABNORMAL LOW (ref 135–145)

## 2018-12-28 LAB — CBC WITH DIFFERENTIAL/PLATELET
Abs Immature Granulocytes: 0.18 10*3/uL — ABNORMAL HIGH (ref 0.00–0.07)
Basophils Absolute: 0.1 10*3/uL (ref 0.0–0.1)
Basophils Relative: 1 %
Eosinophils Absolute: 0.4 10*3/uL (ref 0.0–0.5)
Eosinophils Relative: 4 %
HCT: 39.9 % (ref 39.0–52.0)
Hemoglobin: 13.8 g/dL (ref 13.0–17.0)
Immature Granulocytes: 2 %
Lymphocytes Relative: 25 %
Lymphs Abs: 2.9 10*3/uL (ref 0.7–4.0)
MCH: 33.8 pg (ref 26.0–34.0)
MCHC: 34.6 g/dL (ref 30.0–36.0)
MCV: 97.8 fL (ref 80.0–100.0)
Monocytes Absolute: 0.9 10*3/uL (ref 0.1–1.0)
Monocytes Relative: 8 %
Neutro Abs: 7.1 10*3/uL (ref 1.7–7.7)
Neutrophils Relative %: 60 %
Platelets: 267 10*3/uL (ref 150–400)
RBC: 4.08 MIL/uL — ABNORMAL LOW (ref 4.22–5.81)
RDW: 11.7 % (ref 11.5–15.5)
WBC: 11.5 10*3/uL — ABNORMAL HIGH (ref 4.0–10.5)
nRBC: 0 % (ref 0.0–0.2)

## 2018-12-28 LAB — VANCOMYCIN, TROUGH: Vancomycin Tr: 14 ug/mL — ABNORMAL LOW (ref 15–20)

## 2019-01-01 ENCOUNTER — Encounter: Payer: Self-pay | Admitting: General Surgery

## 2019-01-01 ENCOUNTER — Ambulatory Visit (INDEPENDENT_AMBULATORY_CARE_PROVIDER_SITE_OTHER): Payer: 59 | Admitting: General Surgery

## 2019-01-01 ENCOUNTER — Other Ambulatory Visit: Payer: Self-pay

## 2019-01-01 ENCOUNTER — Other Ambulatory Visit (HOSPITAL_COMMUNITY)
Admission: RE | Admit: 2019-01-01 | Discharge: 2019-01-01 | Disposition: A | Discharge status: Home / Self Care | Payer: 59 | Source: Skilled Nursing Facility | Attending: Pulmonary Disease | Admitting: Pulmonary Disease

## 2019-01-01 VITALS — BP 116/77 | HR 80 | Temp 98.2°F | Resp 16 | Ht 69.0 in | Wt 220.0 lb

## 2019-01-01 DIAGNOSIS — C8331 Diffuse large B-cell lymphoma, lymph nodes of head, face, and neck: Secondary | ICD-10-CM | POA: Insufficient documentation

## 2019-01-01 DIAGNOSIS — M869 Osteomyelitis, unspecified: Secondary | ICD-10-CM | POA: Insufficient documentation

## 2019-01-01 LAB — CBC WITH DIFFERENTIAL/PLATELET
Abs Immature Granulocytes: 0.12 10*3/uL — ABNORMAL HIGH (ref 0.00–0.07)
Basophils Absolute: 0.1 10*3/uL (ref 0.0–0.1)
Basophils Relative: 1 %
Eosinophils Absolute: 0.5 10*3/uL (ref 0.0–0.5)
Eosinophils Relative: 5 %
HCT: 43.1 % (ref 39.0–52.0)
Hemoglobin: 14.4 g/dL (ref 13.0–17.0)
Immature Granulocytes: 1 %
Lymphocytes Relative: 17 %
Lymphs Abs: 1.6 10*3/uL (ref 0.7–4.0)
MCH: 33 pg (ref 26.0–34.0)
MCHC: 33.4 g/dL (ref 30.0–36.0)
MCV: 98.6 fL (ref 80.0–100.0)
Monocytes Absolute: 0.8 10*3/uL (ref 0.1–1.0)
Monocytes Relative: 9 %
Neutro Abs: 5.9 10*3/uL (ref 1.7–7.7)
Neutrophils Relative %: 67 %
Platelets: 248 10*3/uL (ref 150–400)
RBC: 4.37 MIL/uL (ref 4.22–5.81)
RDW: 11.9 % (ref 11.5–15.5)
WBC: 8.9 10*3/uL (ref 4.0–10.5)
nRBC: 0 % (ref 0.0–0.2)

## 2019-01-01 LAB — BASIC METABOLIC PANEL
Anion gap: 10 (ref 5–15)
BUN: 14 mg/dL (ref 6–20)
CO2: 27 mmol/L (ref 22–32)
Calcium: 9.3 mg/dL (ref 8.9–10.3)
Chloride: 101 mmol/L (ref 98–111)
Creatinine, Ser: 1.02 mg/dL (ref 0.61–1.24)
GFR calc Af Amer: >60 mL/min (ref >60)
GFR calc non Af Amer: >60 mL/min (ref >60)
Glucose, Bld: 198 mg/dL — ABNORMAL HIGH (ref 70–99)
Potassium: 5.3 mmol/L — ABNORMAL HIGH (ref 3.5–5.1)
Sodium: 138 mmol/L (ref 135–145)

## 2019-01-01 LAB — VANCOMYCIN, TROUGH: Vancomycin Tr: 14 ug/mL — ABNORMAL LOW (ref 15–20)

## 2019-01-01 NOTE — Progress Notes (Signed)
Subjective:     Fernando Williams  Patient here for follow-up of osteomyelitis of the right third toe.  Patient has been undergoing IV antibiotic therapy.  It ends December 25.  He denies any drainage from the toe.  He has had no pain in the toe. Objective:    BP 116/77 (BP Location: Left Arm, Patient Position: Sitting, Cuff Size: Normal)   Pulse 80   Temp 98.2 F (36.8 C) (Oral)   Resp 16   Ht 5\' 9"  (1.753 m)   Wt 220 lb (99.8 kg)   SpO2 98%   BMI 32.49 kg/m   General:  alert, cooperative and no distress  Right third toe much improved since I last saw him.  Less swelling is noted.  Erythema still present.  Small healed ulcerations are noted on the skin, but no open active drainage is noted.  Dorsalis pedis pulse intact.     Assessment:    Osteomyelitis of right third toe, resolving.  No need for toe amputation at this time.    Plan:   Follow-up here in 3 weeks to assess toe once patient has finished antibiotic course.  Patient was instructed to call me sooner should any complications arise.

## 2019-01-05 ENCOUNTER — Other Ambulatory Visit (HOSPITAL_COMMUNITY)
Admission: RE | Admit: 2019-01-05 | Discharge: 2019-01-05 | Disposition: A | Discharge status: Home / Self Care | Payer: 59 | Source: Skilled Nursing Facility | Attending: Pulmonary Disease | Admitting: Pulmonary Disease

## 2019-01-05 DIAGNOSIS — M869 Osteomyelitis, unspecified: Secondary | ICD-10-CM | POA: Insufficient documentation

## 2019-01-05 LAB — BASIC METABOLIC PANEL
Anion gap: 9 (ref 5–15)
BUN: 20 mg/dL (ref 6–20)
CO2: 24 mmol/L (ref 22–32)
Calcium: 9.1 mg/dL (ref 8.9–10.3)
Chloride: 99 mmol/L (ref 98–111)
Creatinine, Ser: 1.16 mg/dL (ref 0.61–1.24)
GFR calc Af Amer: >60 mL/min (ref >60)
GFR calc non Af Amer: >60 mL/min (ref >60)
Glucose, Bld: 236 mg/dL — ABNORMAL HIGH (ref 70–99)
Potassium: 4.2 mmol/L (ref 3.5–5.1)
Sodium: 132 mmol/L — ABNORMAL LOW (ref 135–145)

## 2019-01-05 LAB — CBC WITH DIFFERENTIAL/PLATELET
Abs Immature Granulocytes: 0.12 10*3/uL — ABNORMAL HIGH (ref 0.00–0.07)
Basophils Absolute: 0.1 10*3/uL (ref 0.0–0.1)
Basophils Relative: 1 %
Eosinophils Absolute: 0.6 10*3/uL — ABNORMAL HIGH (ref 0.0–0.5)
Eosinophils Relative: 6 %
HCT: 42.2 % (ref 39.0–52.0)
Hemoglobin: 13.8 g/dL (ref 13.0–17.0)
Immature Granulocytes: 1 %
Lymphocytes Relative: 21 %
Lymphs Abs: 2.1 10*3/uL (ref 0.7–4.0)
MCH: 32.5 pg (ref 26.0–34.0)
MCHC: 32.7 g/dL (ref 30.0–36.0)
MCV: 99.3 fL (ref 80.0–100.0)
Monocytes Absolute: 0.8 10*3/uL (ref 0.1–1.0)
Monocytes Relative: 8 %
Neutro Abs: 6 10*3/uL (ref 1.7–7.7)
Neutrophils Relative %: 63 %
Platelets: 222 10*3/uL (ref 150–400)
RBC: 4.25 MIL/uL (ref 4.22–5.81)
RDW: 11.9 % (ref 11.5–15.5)
WBC: 9.7 10*3/uL (ref 4.0–10.5)
nRBC: 0 % (ref 0.0–0.2)

## 2019-01-05 LAB — VANCOMYCIN, TROUGH: Vancomycin Tr: 16 ug/mL (ref 15–20)

## 2019-01-08 ENCOUNTER — Other Ambulatory Visit (HOSPITAL_COMMUNITY)
Admission: RE | Admit: 2019-01-08 | Discharge: 2019-01-08 | Disposition: A | Discharge status: Home / Self Care | Payer: 59 | Source: Ambulatory Visit | Attending: Pulmonary Disease | Admitting: Pulmonary Disease

## 2019-01-08 DIAGNOSIS — B952 Enterococcus as the cause of diseases classified elsewhere: Secondary | ICD-10-CM | POA: Insufficient documentation

## 2019-01-08 DIAGNOSIS — M869 Osteomyelitis, unspecified: Secondary | ICD-10-CM | POA: Insufficient documentation

## 2019-01-08 LAB — CBC WITH DIFFERENTIAL/PLATELET
Abs Immature Granulocytes: 0.12 10*3/uL — ABNORMAL HIGH (ref 0.00–0.07)
Basophils Absolute: 0.1 10*3/uL (ref 0.0–0.1)
Basophils Relative: 1 %
Eosinophils Absolute: 0.7 10*3/uL — ABNORMAL HIGH (ref 0.0–0.5)
Eosinophils Relative: 9 %
HCT: 43.6 % (ref 39.0–52.0)
Hemoglobin: 14.4 g/dL (ref 13.0–17.0)
Immature Granulocytes: 2 %
Lymphocytes Relative: 16 %
Lymphs Abs: 1.3 10*3/uL (ref 0.7–4.0)
MCH: 32.6 pg (ref 26.0–34.0)
MCHC: 33 g/dL (ref 30.0–36.0)
MCV: 98.6 fL (ref 80.0–100.0)
Monocytes Absolute: 0.8 10*3/uL (ref 0.1–1.0)
Monocytes Relative: 10 %
Neutro Abs: 5.2 10*3/uL (ref 1.7–7.7)
Neutrophils Relative %: 62 %
Platelets: 173 10*3/uL (ref 150–400)
RBC: 4.42 MIL/uL (ref 4.22–5.81)
RDW: 12.1 % (ref 11.5–15.5)
WBC: 8.2 10*3/uL (ref 4.0–10.5)
nRBC: 0 % (ref 0.0–0.2)

## 2019-01-08 LAB — BASIC METABOLIC PANEL
Anion gap: 8 (ref 5–15)
BUN: 18 mg/dL (ref 6–20)
CO2: 26 mmol/L (ref 22–32)
Calcium: 8.9 mg/dL (ref 8.9–10.3)
Chloride: 102 mmol/L (ref 98–111)
Creatinine, Ser: 1.01 mg/dL (ref 0.61–1.24)
GFR calc Af Amer: >60 mL/min (ref >60)
GFR calc non Af Amer: >60 mL/min (ref >60)
Glucose, Bld: 174 mg/dL — ABNORMAL HIGH (ref 70–99)
Potassium: 5.3 mmol/L — ABNORMAL HIGH (ref 3.5–5.1)
Sodium: 136 mmol/L (ref 135–145)

## 2019-01-08 LAB — VANCOMYCIN, TROUGH: Vancomycin Tr: 16 ug/mL (ref 15–20)

## 2019-01-21 ENCOUNTER — Inpatient Hospital Stay (HOSPITAL_COMMUNITY)
Admission: RE | Admit: 2019-01-21 | Discharge: 2019-01-21 | Disposition: A | Discharge status: Home / Self Care | Payer: 59 | Source: Ambulatory Visit

## 2019-01-22 ENCOUNTER — Encounter: Payer: Self-pay | Admitting: General Surgery

## 2019-01-22 ENCOUNTER — Other Ambulatory Visit: Payer: Self-pay

## 2019-01-22 ENCOUNTER — Ambulatory Visit (INDEPENDENT_AMBULATORY_CARE_PROVIDER_SITE_OTHER): Payer: 59 | Admitting: General Surgery

## 2019-01-22 VITALS — BP 124/81 | HR 74 | Temp 98.4°F | Resp 16 | Ht 69.0 in | Wt 217.0 lb

## 2019-01-22 DIAGNOSIS — M869 Osteomyelitis, unspecified: Secondary | ICD-10-CM | POA: Diagnosis not present

## 2019-01-22 NOTE — Progress Notes (Signed)
Subjective:     Fernando Williams  Here for follow-up of IV antibiotic treatment of right third toe osteomyelitis.  His regimen of IV antibiotics stopped several weeks ago.  He has had no drainage from his right third toe.  He has no pain in the toe.  He denies any fevers or chills. Objective:    BP 124/81 (BP Location: Left Arm, Patient Position: Sitting, Cuff Size: Normal)   Pulse 74   Temp 98.4 F (36.9 C) (Oral)   Resp 16   Ht 5\' 9"  (1.753 m)   Wt 217 lb (98.4 kg)   SpO2 95%   BMI 32.05 kg/m   General:  alert, cooperative and no distress  Right third toe only slightly swollen and erythematous.  A healed eschar is noted on the distal tip.  No open wounds or drainage is noted. The right-sided PICC line was removed and a pressure dressing applied.     Assessment:    Osteomyelitis of right third toe, resolved    Plan:   Patient was told that this could recur.  He should avoid any trauma to the right third toe.  He understands this and agrees.  Follow-up here as needed.

## 2019-02-06 NOTE — Progress Notes (Signed)
PCP: Dr Luan Pulling Primary HF Cardiologist: Dr Haroldine Laws   HPI:  Fernando Allsbrook Wagoneris a 58 y.o.malewith hx of tobacco abuse, SOB, obesity and new onset systolic HF diagnosed in July 2020.   Admitted with increased dyspnea and volume overload in the setting of acute systolic heart failure. Diuresed > 20 pounds and underwent cath that showed NICM, min obstructive CAD, and mild-mod elevated filling pressures. CMRI completed and showedLVEF 17% , RV EF 28% and LGE in basal septum with possible viral myocarditis.HF medications initiated.   Last clinic visit 10/2018, he was doing well. Was symptomatically much improved, NYHA I. Losartan changed to Entresto. Echo was repeated at visit and EF was 25-30%.     Recently admitted to [facility] 11/30-12/4 for rt toe osteomyelitis and treated w/ IV antibiotics. Was discharged home w/ PICC and plan to is continue abx x 3 wks. On vancomycin. He was also started on Jardiance during hospitalization.   Recently presented to HF Clinic for follow up with Lyda Jester on 12/22/18. Was doing well from a HF standpoint. Weight stable. 216 lb today (215 lb at last visit). BP 118/68. Pulse 86 bpm. No symptoms. Denied exertional dyspnea. No orthopnea/ PND or LEE. Denied palpitations, dizziness, syncope/ near syncope. Tolerating meds ok, except for mild GI upset w/ Metformin. Followed closely by home health given IV abx. Denied fever and chills. No toe pain.   Today he returns to HF clinic for pharmacist medication titration. At last visit with MD, Delene Loll was increased to 49/51 mg BID and digoxin was discontinued given improvement in HF symptoms. Overall, he is feeling good today. No lightheadedness/dizziness. No fatigue, chest pain or palpitations. Breathing is good. No SOB/DOE. He keeps active and works full-time. No LEE, PND, or orthopnea. He weighs himself daily at home, and his normal weight is 211-212 lbs (212 lbs today). His clinic weight today was 226 lb. (220 lb. at last  visit). Appetite is good. He follows a low sodium diet. He is taking all medications as prescribed.     . Shortness of breath/dyspnea on exertion? no  . Orthopnea/PND? no . Edema? no . Lightheadedness/dizziness? no . Daily weights at home? Yes-keeps daily logbook  . Blood pressure/heart rate monitoring at home? no . Following low-sodium/fluid-restricted diet? yes  HF Medications: Carvedilol 6.25 mg BID Entresto 49/51 mg BID Spironolactone 25 mg daily Empagliflozin 10 mg daily Potassium 20 mEq daily    Has the patient been experiencing any side effects to the medications prescribed?  no  Does the patient have any problems obtaining medications due to transportation or finances?   No - has Pharmacist, community  Understanding of regimen: good Understanding of indications: good Potential of compliance: good Patient understands to avoid NSAIDs. Patient understands to avoid decongestants.    Pertinent Lab Values: . Serum creatinine 1.12, BUN 22, Potassium 5.4, Sodium 136  Vital Signs: . Weight: 226 lbs (last clinic weight: 220) . Blood pressure: 128/74  . Heart rate: 75   Assessment: 1. Chronic Systolic HF with biventricular dysfunction - Echo 07/16/18 EF 15-20%Restrictive filling pattern RVEF mild to moderately depressed.There was moderate pericardial effusion - RHC/LHC mild non obs CAD, severe NICM, elevated filling pressures.  -7/7/2020CMRI completed and showedLVEF 17% , RV EF 28% and LGE in basal septum with possible viral myocarditis. - repeat echo 10/20 w/ EF 25-30% (mild improvement) - Symptomatically much improved NYHA I .  - Vitals: BP 128/74, HR 75 - Labs reviewed in clinic: Scr stable at 1.12, K elevated at 5.4.  Will have him discontinue potassium chloride 20 mEq daily and start low potassium diet. Repeat BMET in 10 days.  - Not currently taking any diuretics.  - Increase carvedilol to 12.5 mg twice a day.  - Continue Entresto 49/51 mg BID. Consider  increasing next visit if potassium level allows.  - Continue spironolactone 25mg  daily. - Continue empagliflozin 10 mg daily. Could consider increasing to 25 mg daily at future visit given concomitant diabetes.  - Digoxin discontinued on 12/22/18 given improvement in heart failure Sx (now NYHA class 1) - Refuses lifeVest.  - EF improving. No ICD yet until meds fully titrated. Will also need resolution of infection prior to potential implant (current treatment for toe osteomyelitis) .   2. CKD Stage II - SCr 1.12, GFR >60. Has now completed course of IV vancomycin for osteomyelitis.   3. DMII - HGb A1C 10.9 on 12/15/18.  - Continue Jardiance 10 mg daily - Continue Metformin 500 mg BID. Pt unable to tolerate 1000 mg BID due to GI upset.    - Continue atorvastatin 20 mg daily  4. CAD -Nonobstructive CAD . Mid LAD 40% stenoses, Prox RCA, Mid RCA 20%, Prox CX-Min CX 30 %. - No s/s ischemia. Continue atorvastatin 20 mg daily and aspirin 81 mg daily  5. Rt Toe Osteomyelitis: recent admit to [facility] 11/30-12/4 Cultures grew  Organism ID, Bacteria ENTEROCOCCUS FAECALIS   Organism ID, Bacteria STENOTROPHOMONAS MALTOPHILIA   Organism ID, Bacteria STREPTOCOCCUS MITIS/ORALIS   - Completed 3 weeks of IV vancomycin on 01/09/19. Now off antibiotics.     Plan: 1) Medication changes: Based on clinical presentation, vital signs and recent labs will increase carvedilol to 12.5 mg BID and discontinue potassium chloride 20 mEq daily.   2) Labs: Scr 1.12, K 5.4 3) Follow-up: 2 weeks with Dr. Haroldine Laws.    Audry Riles, PharmD, BCPS, BCCP, CPP Heart Failure Clinic Pharmacist [phone]

## 2019-02-11 ENCOUNTER — Ambulatory Visit (HOSPITAL_COMMUNITY)
Admission: RE | Admit: 2019-02-11 | Discharge: 2019-02-11 | Disposition: A | Discharge status: Home / Self Care | Payer: 59 | Source: Ambulatory Visit | Attending: Internal Medicine | Admitting: Internal Medicine

## 2019-02-11 ENCOUNTER — Other Ambulatory Visit: Payer: Self-pay

## 2019-02-11 VITALS — BP 128/74 | HR 75 | Wt 226.0 lb

## 2019-02-11 DIAGNOSIS — N182 Chronic kidney disease, stage 2 (mild): Secondary | ICD-10-CM | POA: Insufficient documentation

## 2019-02-11 DIAGNOSIS — Z7901 Long term (current) use of anticoagulants: Secondary | ICD-10-CM | POA: Diagnosis not present

## 2019-02-11 DIAGNOSIS — I2510 Atherosclerotic heart disease of native coronary artery without angina pectoris: Secondary | ICD-10-CM | POA: Insufficient documentation

## 2019-02-11 DIAGNOSIS — Z79899 Other long term (current) drug therapy: Secondary | ICD-10-CM | POA: Insufficient documentation

## 2019-02-11 DIAGNOSIS — M868X8 Other osteomyelitis, other site: Secondary | ICD-10-CM | POA: Diagnosis not present

## 2019-02-11 DIAGNOSIS — E118 Type 2 diabetes mellitus with unspecified complications: Secondary | ICD-10-CM | POA: Insufficient documentation

## 2019-02-11 DIAGNOSIS — I5022 Chronic systolic (congestive) heart failure: Secondary | ICD-10-CM

## 2019-02-11 DIAGNOSIS — I313 Pericardial effusion (noninflammatory): Secondary | ICD-10-CM | POA: Diagnosis not present

## 2019-02-11 DIAGNOSIS — Z7984 Long term (current) use of oral hypoglycemic drugs: Secondary | ICD-10-CM | POA: Diagnosis not present

## 2019-02-11 DIAGNOSIS — Z87891 Personal history of nicotine dependence: Secondary | ICD-10-CM | POA: Insufficient documentation

## 2019-02-11 DIAGNOSIS — I428 Other cardiomyopathies: Secondary | ICD-10-CM | POA: Diagnosis not present

## 2019-02-11 DIAGNOSIS — Z7982 Long term (current) use of aspirin: Secondary | ICD-10-CM | POA: Insufficient documentation

## 2019-02-11 LAB — BASIC METABOLIC PANEL
Anion gap: 7 (ref 5–15)
BUN: 22 mg/dL — ABNORMAL HIGH (ref 6–20)
CO2: 27 mmol/L (ref 22–32)
Calcium: 9.4 mg/dL (ref 8.9–10.3)
Chloride: 102 mmol/L (ref 98–111)
Creatinine, Ser: 1.12 mg/dL (ref 0.61–1.24)
GFR calc Af Amer: >60 mL/min (ref >60)
GFR calc non Af Amer: >60 mL/min (ref >60)
Glucose, Bld: 211 mg/dL — ABNORMAL HIGH (ref 70–99)
Potassium: 5.4 mmol/L — ABNORMAL HIGH (ref 3.5–5.1)
Sodium: 136 mmol/L (ref 135–145)

## 2019-02-11 MED ORDER — CARVEDILOL 12.5 MG PO TABS: 12.5000 mg | ORAL_TABLET | Freq: Two times a day (BID) | ORAL | 3 refills | Status: DC

## 2019-02-11 MED ORDER — SPIRONOLACTONE 25 MG PO TABS: 25.0000 mg | ORAL_TABLET | Freq: Every day | ORAL | 3 refills | Status: DC

## 2019-02-11 NOTE — Patient Instructions (Addendum)
It was a pleasure seeing you today!  MEDICATIONS: -We are changing your medications today -Increase carvedilol to 12.5 mg (1 tablet) twice daily. You may take 2 tablets of the carvedilol 6.25 mg strength twice daily until you pick up the higher dose.  -Call if you have questions about your medications.  LABS:  -We will call you if your labs need attention.  NEXT APPOINTMENT: Return to clinic in 2 weeks with Dr. Haroldine Laws.  In general, to take care of your heart failure: -Limit your fluid intake to 2 Liters (half-gallon) per day.   -Limit your salt intake to ideally 2-3 grams (2000-3000 mg) per day. -Weigh yourself daily and record, and bring that "weight diary" to your next appointment.  (Weight gain of 2-3 pounds in 1 day typically means fluid weight.) -The medications for your heart are to help your heart and help you live longer.   -Please contact us before stopping any of your heart medications.  Call the clinic at [phone] with questions or to reschedule future appointments.

## 2019-02-14 ENCOUNTER — Other Ambulatory Visit (HOSPITAL_COMMUNITY): Payer: Self-pay | Admitting: Adult Health

## 2019-02-16 ENCOUNTER — Other Ambulatory Visit (HOSPITAL_COMMUNITY): Payer: Self-pay

## 2019-02-16 MED ORDER — ATORVASTATIN CALCIUM 20 MG PO TABS: 20.0000 mg | ORAL_TABLET | Freq: Every day | ORAL | 6 refills | Status: DC

## 2019-02-25 ENCOUNTER — Encounter (HOSPITAL_COMMUNITY): Payer: 59 | Admitting: Internal Medicine

## 2019-02-25 ENCOUNTER — Ambulatory Visit: Payer: 59 | Admitting: Family Medicine

## 2019-03-08 NOTE — Progress Notes (Signed)
ADVANCED HF CLINIC NOTE PCP: Dr Sharion Balloon  Primary HF Cardiologist: Dr Haroldine Laws   HPI: Fernando Sarabia Wagoneris a 59 y.o.malewith hx of tobacco abuse, SOB, obesity and new onset systolic HF diagnosed in July 2020.   Admitted with increased dyspnea and volume overload in the setting of acute systolic heart failure. Diuresed > 20 pounds and underwent cath that showed NICM, min obstructive cad, and mild-mod elevated filling pressures. CMRI completed and showed LVEF 17% , RV EF 28% and LGE in basal septum with possible viral myocarditis. HF medications initiated.   Last clinic visit 10/2018, he was doing well. Was symptomatically much improved, NYHA I. Losartan changed to Entresto. Echo was repeated at visit and EF was 25-30%.     Recently admitted to [facility] 11/30-12/4 for rt toe osteomyelitis and treated w/ IV antibiotics. Was discharged home w/ PICC and plan to is continue abx x 3 wks. Was on vancomycin. He was also started on Jardiance during hospitalization.   He was COVID + 02/17/19 but had no symptoms.   Today he returns for HF follow up. Last visit carvedilol was increased to 12.5 mg twice a day. Overall feeling fine. Denies SOB/PND/Orthopnea. Appetite ok. No fever or chills. Weight at home has been trending up 211-->216 pounds. Taking all medications. Continues to work full time.   ECHO 7/1/20202 EF 60-20%  Echo 11/12/2018  25 to 30%   CMRI 07/23/2018  1.  Severely dilated LV with EF 17%, diffuse hypokinesis. 2. Mildly dilated RV with EF 28%, moderate to severe systolic dysfunction. 3. Primarily mid-wall LGE in the basal septum, but also rim of subendocardial LGE in the basal inferolateral wall. This is probably a non-coronary pattern, possible prior viral myocarditis.  ROS: All systems negative except as listed in HPI, PMH and Problem List.  SH:  Social History   Socioeconomic History  . Marital status: Single    Spouse name: Not on file  . Number of children: Not on file  .  Years of education: Not on file  . Highest education level: Not on file  Occupational History  . Not on file  Tobacco Use  . Smoking status: Former Smoker    Types: Cigars    Quit date: 04/07/2018    Years since quitting: 0.9  . Smokeless tobacco: Never Used  Substance and Sexual Activity  . Alcohol use: No  . Drug use: No  . Sexual activity: Yes    Birth control/protection: None  Other Topics Concern  . Not on file  Social History Narrative  . Not on file   Social Determinants of Health   Financial Resource Strain:   . Difficulty of Paying Living Expenses: Not on file  Food Insecurity:   . Worried About Charity fundraiser in the Last Year: Not on file  . Ran Out of Food in the Last Year: Not on file  Transportation Needs:   . Lack of Transportation (Medical): Not on file  . Lack of Transportation (Non-Medical): Not on file  Physical Activity:   . Days of Exercise per Week: Not on file  . Minutes of Exercise per Session: Not on file  Stress:   . Feeling of Stress : Not on file  Social Connections:   . Frequency of Communication with Friends and Family: Not on file  . Frequency of Social Gatherings with Friends and Family: Not on file  . Attends Religious Services: Not on file  . Active Member of Clubs or Organizations: Not on  file  . Attends Archivist Meetings: Not on file  . Marital Status: Not on file  Intimate Partner Violence:   . Fear of Current or Ex-Partner: Not on file  . Emotionally Abused: Not on file  . Physically Abused: Not on file  . Sexually Abused: Not on file    FH:  Family History  Problem Relation Age of Onset  . Diabetes Mother   . Cancer Father     Past Medical History:  Diagnosis Date  . CHF (congestive heart failure) ([place])   . Pneumonia     Current Outpatient Medications  Medication Sig Dispense Refill  . albuterol (PROVENTIL HFA;VENTOLIN HFA) 108 (90 Base) MCG/ACT inhaler Inhale 2 puffs into the lungs 3 (three) times  daily. 1 Inhaler 1  . aspirin EC 81 MG EC tablet Take 1 tablet (81 mg total) by mouth daily. 30 tablet 6  . atorvastatin (LIPITOR) 20 MG tablet Take 1 tablet (20 mg total) by mouth daily at 6 PM. 30 tablet 6  . B Complex-C-Folic Acid (SUPER B COMPLEX/FA/VIT C PO) Take 1 tablet by mouth daily.     . carvedilol (COREG) 12.5 MG tablet Take 1 tablet (12.5 mg total) by mouth 2 (two) times daily with a meal. 180 tablet 3  . empagliflozin (JARDIANCE) 10 MG TABS tablet Take 10 mg by mouth daily before breakfast. 30 tablet 5  . glucosamine-chondroitin 500-400 MG tablet Take 2 tablets by mouth daily.     . Magnesium 250 MG TABS Take 1 tablet by mouth every evening.     . metFORMIN (GLUCOPHAGE) 500 MG tablet Take 2 tablets (1,000 mg total) by mouth 2 (two) times daily with a meal. (Patient taking differently: Take 1,000 mg by mouth 1 day or 1 dose. ) 120 tablet 11  . Multiple Vitamin (MULTIVITAMIN) tablet Take 1 tablet by mouth every evening.     Glory Rosebush VERIO test strip USE TO CHECK BLOOD SUGAR TWICE DAILY    . sacubitril-valsartan (ENTRESTO) 49-51 MG Take 1 tablet by mouth 2 (two) times daily. 60 tablet 6  . spironolactone (ALDACTONE) 25 MG tablet Take 1 tablet (25 mg total) by mouth daily. 90 tablet 3  . vitamin B-12 (CYANOCOBALAMIN) 500 MCG tablet Take 1,000 mcg by mouth daily.     No current facility-administered medications for this encounter.    Vitals:   03/09/19 0846  BP: 120/78  Pulse: 68  SpO2: 98%  Weight: 103.3 kg (227 lb 12.8 oz)   Wt Readings from Last 3 Encounters:  03/09/19 103.3 kg (227 lb 12.8 oz)  02/11/19 102.5 kg (226 lb)  01/22/19 98.4 kg (217 lb)    PHYSICAL EXAM: General:  Well appearing. No resp difficulty HEENT: normal Neck: supple. no JVD. Carotids 2+ bilat; no bruits. No lymphadenopathy or thryomegaly appreciated. Cor: PMI nondisplaced. Regular rate & rhythm. No rubs, gallops or murmurs. Lungs: clear Abdomen: soft, nontender, nondistended. No  hepatosplenomegaly. No bruits or masses. Good bowel sounds. Extremities: no cyanosis, clubbing, rash, edema Neuro: alert & orientedx3, cranial nerves grossly intact. moves all 4 extremities w/o difficulty. Affect pleasant   ASSESSMENT & PLAN: 1. Chronic Systolic HF with biventricular dysfunction - Echo 07/16/18 EF 15-20%Restricitve filling pattern RVEF mild to moderately depressed There was moderate pericardial effusion - RHC/LHC mild non obs CAD, severe NICM, elevated filling pressures.  - 07/22/2018 CMRI completed and showed LVEF 17% , RV EF 28% and LGE in basal septum with possible viral myocarditis.  - repeat echo 10/20  w/ EF 25-30% (mild improvement) NYHA I. Volume status stable.   Continue coreg 12.5 mg tiwce a day.     - Continue spiro 25mg  daily. -Increase entresto to 97-103 mg twice a day. Check BMET today and in 7 days.  - Continue  Jardiance 10 mg daily.     -  EF improving. No ICD yet until meds fully titrated. Will also need resolution of infection prior to potential implant (current treatment for toe osteomyelitis) .  - Repeat ECHO in 6 weeks.   2. CKD Stage II - Creatinine baseline 1-1.2 - Check BMET today.   3. DMII - HGb A1C 10.9 on 12/15/18  - Continue Jardiance 10 mg daily - Continue Metformin 500 mg bid  - He has follow up with PCP next month.   4. CAD -Nonobstructive CAD . Mid LAD 40% stenoses, Prox RCA, Mid RCA 20%, Prox CX-Min CX 30 %.  - No chest pain.  Continue lipitor and aspirin.  5. Rt Toe Osteomyelitis: recent admit to [facility] 11/30-12/4 Cultures grew  Organism ID, Bacteria ENTEROCOCCUS FAECALIS   Organism ID, Bacteria STENOTROPHOMONAS MALTOPHILIA   Organism ID, Bacteria STREPTOCOCCUS MITIS/ORALIS   - Completed antibiotic course.   6. Hyperkalemia K 5.3 last check but has been eating high potassium foods. Advised to limit high potassium foods.   Follow up in 6 weeks with Dr Haroldine Laws and an ECHO.   Hasan Douse NP-C  8:53 AM

## 2019-03-09 ENCOUNTER — Other Ambulatory Visit: Payer: Self-pay

## 2019-03-09 ENCOUNTER — Encounter (HOSPITAL_COMMUNITY): Payer: Self-pay

## 2019-03-09 ENCOUNTER — Ambulatory Visit (HOSPITAL_COMMUNITY)
Admission: RE | Admit: 2019-03-09 | Discharge: 2019-03-09 | Disposition: A | Discharge status: Home / Self Care | Payer: 59 | Source: Ambulatory Visit | Attending: Internal Medicine | Admitting: Internal Medicine

## 2019-03-09 ENCOUNTER — Telehealth (HOSPITAL_COMMUNITY): Payer: Self-pay

## 2019-03-09 VITALS — BP 120/78 | HR 68 | Wt 227.8 lb

## 2019-03-09 DIAGNOSIS — N182 Chronic kidney disease, stage 2 (mild): Secondary | ICD-10-CM | POA: Insufficient documentation

## 2019-03-09 DIAGNOSIS — I509 Heart failure, unspecified: Secondary | ICD-10-CM | POA: Diagnosis not present

## 2019-03-09 DIAGNOSIS — I2510 Atherosclerotic heart disease of native coronary artery without angina pectoris: Secondary | ICD-10-CM | POA: Diagnosis not present

## 2019-03-09 DIAGNOSIS — I5022 Chronic systolic (congestive) heart failure: Secondary | ICD-10-CM | POA: Diagnosis not present

## 2019-03-09 DIAGNOSIS — Z87891 Personal history of nicotine dependence: Secondary | ICD-10-CM | POA: Diagnosis not present

## 2019-03-09 DIAGNOSIS — Z7982 Long term (current) use of aspirin: Secondary | ICD-10-CM | POA: Insufficient documentation

## 2019-03-09 DIAGNOSIS — E1122 Type 2 diabetes mellitus with diabetic chronic kidney disease: Secondary | ICD-10-CM | POA: Insufficient documentation

## 2019-03-09 DIAGNOSIS — Z7984 Long term (current) use of oral hypoglycemic drugs: Secondary | ICD-10-CM | POA: Diagnosis not present

## 2019-03-09 DIAGNOSIS — Z833 Family history of diabetes mellitus: Secondary | ICD-10-CM | POA: Diagnosis not present

## 2019-03-09 DIAGNOSIS — E875 Hyperkalemia: Secondary | ICD-10-CM | POA: Diagnosis not present

## 2019-03-09 DIAGNOSIS — I428 Other cardiomyopathies: Secondary | ICD-10-CM | POA: Diagnosis not present

## 2019-03-09 DIAGNOSIS — Z79899 Other long term (current) drug therapy: Secondary | ICD-10-CM | POA: Diagnosis not present

## 2019-03-09 DIAGNOSIS — Z6833 Body mass index (BMI) 33.0-33.9, adult: Secondary | ICD-10-CM | POA: Diagnosis not present

## 2019-03-09 DIAGNOSIS — E669 Obesity, unspecified: Secondary | ICD-10-CM | POA: Diagnosis not present

## 2019-03-09 LAB — BASIC METABOLIC PANEL
Anion gap: 10 (ref 5–15)
BUN: 21 mg/dL — ABNORMAL HIGH (ref 6–20)
CO2: 25 mmol/L (ref 22–32)
Calcium: 9.6 mg/dL (ref 8.9–10.3)
Chloride: 101 mmol/L (ref 98–111)
Creatinine, Ser: 1.16 mg/dL (ref 0.61–1.24)
GFR calc Af Amer: >60 mL/min (ref >60)
GFR calc non Af Amer: >60 mL/min (ref >60)
Glucose, Bld: 173 mg/dL — ABNORMAL HIGH (ref 70–99)
Potassium: 5.6 mmol/L — ABNORMAL HIGH (ref 3.5–5.1)
Sodium: 136 mmol/L (ref 135–145)

## 2019-03-09 MED ORDER — SACUBITRIL-VALSARTAN 97-103 MG PO TABS: 1.0000 | ORAL_TABLET | Freq: Two times a day (BID) | ORAL | 6 refills | Status: DC

## 2019-03-09 NOTE — Progress Notes (Signed)
During OV he reported eating high potassium foods. Potassium high. Please call and tell him to avoid all high potassium foods. Stop Mrs Deliah Boston. Please start lokelma 10 grams daily for 3 days.   Check BMET Thursday.

## 2019-03-09 NOTE — Telephone Encounter (Signed)
Spoke with pt about lab results. Pt agreeable to pick up samples of lokelma tomorrow morning. Per amy NP-C pt is to hold his night time dose of Entresto tonight and resume tomorrow morning after he picks up his samples. Pt agreeable. Lab work to be drawn Friday. Instead of next Monday.

## 2019-03-09 NOTE — Telephone Encounter (Signed)
-----   Message from Conrad Lincoln, NP sent at 03/09/2019 11:14 AM EST ----- During OV he reported eating high potassium foods. Potassium high. Please call and tell him to avoid all high potassium foods. Stop Mrs Deliah Boston. Please start lokelma 10 grams daily for 3 days.   Check BMET Thursday.

## 2019-03-09 NOTE — Patient Instructions (Signed)
Lab work done today. We will notify you of any abnormal lab work. No news is good news!  Lab work will need to be done again in 7 days.  INCREASE Entresto to 97/103mg  tab twice daily.  Your physician has requested that you have an echocardiogram. Echocardiography is a painless test that uses sound waves to create images of your heart. It provides your doctor with information about the size and shape of your heart and how well your heart's chambers and valves are working. This procedure takes approximately one hour. There are no restrictions for this procedure. This will be done at your follow up appointment.  Please follow up with the [facility] in 6 weeks with an echocardiogram.  At the [facility], you and your health needs are our priority. As part of our continuing mission to provide you with exceptional heart care, we have created designated Provider Care Teams. These Care Teams include your primary Cardiologist (physician) and Advanced Practice Providers (APPs- Physician Assistants and Nurse Practitioners) who all work together to provide you with the care you need, when you need it.   You may see any of the following providers on your designated Care Team at your next follow up: Marland Kitchen Dr Glori Bickers . Dr Loralie Champagne . Darrick Grinder, NP . Lyda Jester, PA . Audry Riles, PharmD   Please be sure to bring in all your medications bottles to every appointment.

## 2019-03-13 ENCOUNTER — Ambulatory Visit (HOSPITAL_COMMUNITY)
Admission: RE | Admit: 2019-03-13 | Discharge: 2019-03-13 | Disposition: A | Discharge status: Home / Self Care | Payer: 59 | Source: Ambulatory Visit | Attending: Cardiology | Admitting: Cardiology

## 2019-03-13 ENCOUNTER — Other Ambulatory Visit: Payer: Self-pay

## 2019-03-13 DIAGNOSIS — I509 Heart failure, unspecified: Secondary | ICD-10-CM

## 2019-03-13 LAB — BASIC METABOLIC PANEL
Anion gap: 7 (ref 5–15)
BUN: 17 mg/dL (ref 6–20)
CO2: 27 mmol/L (ref 22–32)
Calcium: 9.3 mg/dL (ref 8.9–10.3)
Chloride: 103 mmol/L (ref 98–111)
Creatinine, Ser: 1.1 mg/dL (ref 0.61–1.24)
GFR calc Af Amer: >60 mL/min (ref >60)
GFR calc non Af Amer: >60 mL/min (ref >60)
Glucose, Bld: 183 mg/dL — ABNORMAL HIGH (ref 70–99)
Potassium: 5 mmol/L (ref 3.5–5.1)
Sodium: 137 mmol/L (ref 135–145)

## 2019-03-16 ENCOUNTER — Other Ambulatory Visit (HOSPITAL_COMMUNITY): Payer: 59

## 2019-03-17 ENCOUNTER — Ambulatory Visit: Payer: 59 | Admitting: Family Medicine

## 2019-04-22 ENCOUNTER — Encounter (HOSPITAL_COMMUNITY): Payer: Self-pay | Admitting: Internal Medicine

## 2019-04-22 ENCOUNTER — Ambulatory Visit (HOSPITAL_BASED_OUTPATIENT_CLINIC_OR_DEPARTMENT_OTHER)
Admission: RE | Admit: 2019-04-22 | Discharge: 2019-04-22 | Disposition: A | Discharge status: Home / Self Care | Payer: 59 | Source: Ambulatory Visit | Attending: Internal Medicine | Admitting: Internal Medicine

## 2019-04-22 ENCOUNTER — Other Ambulatory Visit: Payer: Self-pay

## 2019-04-22 ENCOUNTER — Ambulatory Visit (HOSPITAL_COMMUNITY)
Admission: RE | Admit: 2019-04-22 | Discharge: 2019-04-22 | Disposition: A | Discharge status: Home / Self Care | Payer: 59 | Source: Ambulatory Visit | Attending: Adult Health | Admitting: Adult Health

## 2019-04-22 VITALS — BP 130/84 | HR 60 | Wt 230.0 lb

## 2019-04-22 DIAGNOSIS — I2510 Atherosclerotic heart disease of native coronary artery without angina pectoris: Secondary | ICD-10-CM | POA: Diagnosis not present

## 2019-04-22 DIAGNOSIS — Z7984 Long term (current) use of oral hypoglycemic drugs: Secondary | ICD-10-CM | POA: Diagnosis not present

## 2019-04-22 DIAGNOSIS — E1122 Type 2 diabetes mellitus with diabetic chronic kidney disease: Secondary | ICD-10-CM | POA: Diagnosis not present

## 2019-04-22 DIAGNOSIS — I5022 Chronic systolic (congestive) heart failure: Secondary | ICD-10-CM | POA: Insufficient documentation

## 2019-04-22 DIAGNOSIS — I313 Pericardial effusion (noninflammatory): Secondary | ICD-10-CM | POA: Diagnosis not present

## 2019-04-22 DIAGNOSIS — I509 Heart failure, unspecified: Secondary | ICD-10-CM

## 2019-04-22 DIAGNOSIS — N182 Chronic kidney disease, stage 2 (mild): Secondary | ICD-10-CM | POA: Diagnosis not present

## 2019-04-22 DIAGNOSIS — Z87891 Personal history of nicotine dependence: Secondary | ICD-10-CM | POA: Diagnosis not present

## 2019-04-22 DIAGNOSIS — E669 Obesity, unspecified: Secondary | ICD-10-CM | POA: Diagnosis not present

## 2019-04-22 DIAGNOSIS — I428 Other cardiomyopathies: Secondary | ICD-10-CM | POA: Insufficient documentation

## 2019-04-22 DIAGNOSIS — E875 Hyperkalemia: Secondary | ICD-10-CM

## 2019-04-22 DIAGNOSIS — Z7901 Long term (current) use of anticoagulants: Secondary | ICD-10-CM | POA: Diagnosis not present

## 2019-04-22 DIAGNOSIS — Z79899 Other long term (current) drug therapy: Secondary | ICD-10-CM | POA: Diagnosis not present

## 2019-04-22 DIAGNOSIS — Z7982 Long term (current) use of aspirin: Secondary | ICD-10-CM | POA: Diagnosis not present

## 2019-04-22 LAB — BASIC METABOLIC PANEL
Anion gap: 8 (ref 5–15)
BUN: 20 mg/dL (ref 6–20)
CO2: 25 mmol/L (ref 22–32)
Calcium: 9.6 mg/dL (ref 8.9–10.3)
Chloride: 103 mmol/L (ref 98–111)
Creatinine, Ser: 1 mg/dL (ref 0.61–1.24)
GFR calc Af Amer: >60 mL/min (ref >60)
GFR calc non Af Amer: >60 mL/min (ref >60)
Glucose, Bld: 143 mg/dL — ABNORMAL HIGH (ref 70–99)
Potassium: 4.9 mmol/L (ref 3.5–5.1)
Sodium: 136 mmol/L (ref 135–145)

## 2019-04-22 LAB — BRAIN NATRIURETIC PEPTIDE: B Natriuretic Peptide: 48.3 pg/mL (ref 0.0–100.0)

## 2019-04-22 NOTE — Progress Notes (Signed)
  Echocardiogram 2D Echocardiogram has been performed.  Wanya Bangura A Rollyn Scialdone 04/22/2019, 9:39 AM

## 2019-04-22 NOTE — Progress Notes (Addendum)
ADVANCED HF CLINIC NOTE PCP: Dr Luan Pulling Primary HF Cardiologist: Dr Haroldine Laws   HPI: Fernando Radach Wagoneris a 58 y.o.malewith hx of tobacco abuse, SOB, obesity and new onset systolic HF July [id number].   Admitted with increased dyspnea and volume overload in the setting of acute systolic heart failure. Diuresed > 20 pounds and underwent cath that showed NICM, min obstructive cad, and mild-mod elevated filling pressures.cMRI completed and showed LVEF 17% , RV EF 28% and LGE in basal septum with possible viral myocarditis. HF medications initiated.   Today he returns for HF follow up. Working FT at [employer]. No CP or SOB. No orthopnea, PND or edema. Compliant with all meds. No low BPs.  Echo today EF 40-45% RV normal Personally reviewed  Echo 12/20  EF 25-30%  ECHO 07/16/2018 EF 15-20%   CMRI 07/23/2018  1.  Severely dilated LV with EF 17%, diffuse hypokinesis. 2. Mildly dilated RV with EF 28%, moderate to severe systolic dysfunction. 3. Primarily mid-wall LGE in the basal septum, but also rim of subendocardial LGE in the basal inferolateral wall. This is probably a non-coronary pattern, possible prior viral myocarditis.  ROS: All systems negative except as listed in HPI, PMH and Problem List.  SH:  Social History   Socioeconomic History  . Marital status: Single    Spouse name: Not on file  . Number of children: Not on file  . Years of education: Not on file  . Highest education level: Not on file  Occupational History  . Not on file  Tobacco Use  . Smoking status: Former Smoker    Types: Cigars    Quit date: 04/07/2018    Years since quitting: 1.0  . Smokeless tobacco: Never Used  Substance and Sexual Activity  . Alcohol use: No  . Drug use: No  . Sexual activity: Yes    Birth control/protection: None  Other Topics Concern  . Not on file  Social History Narrative  . Not on file   Social Determinants of Health   Financial Resource Strain:   . Difficulty of Paying  Living Expenses:   Food Insecurity:   . Worried About Charity fundraiser in the Last Year:   . Arboriculturist in the Last Year:   Transportation Needs:   . Film/video editor (Medical):   Marland Kitchen Lack of Transportation (Non-Medical):   Physical Activity:   . Days of Exercise per Week:   . Minutes of Exercise per Session:   Stress:   . Feeling of Stress :   Social Connections:   . Frequency of Communication with Friends and Family:   . Frequency of Social Gatherings with Friends and Family:   . Attends Religious Services:   . Active Member of Clubs or Organizations:   . Attends Archivist Meetings:   Marland Kitchen Marital Status:   Intimate Partner Violence:   . Fear of Current or Ex-Partner:   . Emotionally Abused:   Marland Kitchen Physically Abused:   . Sexually Abused:     FH:  Family History  Problem Relation Age of Onset  . Diabetes Mother   . Cancer Father     Past Medical History:  Diagnosis Date  . CHF (congestive heart failure) ([place])   . Pneumonia     Current Outpatient Medications  Medication Sig Dispense Refill  . aspirin EC 81 MG EC tablet Take 1 tablet (81 mg total) by mouth daily. 30 tablet 6  . atorvastatin (LIPITOR) 20  MG tablet Take 1 tablet (20 mg total) by mouth daily at 6 PM. 30 tablet 6  . B Complex-C-Folic Acid (SUPER B COMPLEX/FA/VIT C PO) Take 1 tablet by mouth daily.     . carvedilol (COREG) 12.5 MG tablet Take 1 tablet (12.5 mg total) by mouth 2 (two) times daily with a meal. 180 tablet 3  . empagliflozin (JARDIANCE) 10 MG TABS tablet Take 10 mg by mouth daily before breakfast. 30 tablet 5  . glucosamine-chondroitin 500-400 MG tablet Take 2 tablets by mouth daily.     . Magnesium 250 MG TABS Take 1 tablet by mouth every evening.     . metFORMIN (GLUCOPHAGE) 500 MG tablet Take 500 mg by mouth 2 (two) times daily with a meal.    . ONETOUCH VERIO test strip USE TO CHECK BLOOD SUGAR TWICE DAILY    . sacubitril-valsartan (ENTRESTO) 97-103 MG Take 1 tablet by  mouth 2 (two) times daily. 60 tablet 6  . spironolactone (ALDACTONE) 25 MG tablet Take 1 tablet (25 mg total) by mouth daily. 90 tablet 3  . vitamin B-12 (CYANOCOBALAMIN) 500 MCG tablet Take 1,000 mcg by mouth daily.     No current facility-administered medications for this encounter.    Vitals:   04/22/19 0952  BP: 130/84  Pulse: 60  SpO2: 99%  Weight: 104.3 kg (230 lb)   Wt Readings from Last 3 Encounters:  04/22/19 104.3 kg (230 lb)  03/09/19 103.3 kg (227 lb 12.8 oz)  02/11/19 102.5 kg (226 lb)    PHYSICAL EXAM: General:  Well appearing. No resp difficulty HEENT: normal Neck: supple. no JVD. Carotids 2+ bilat; no bruits. No lymphadenopathy or thryomegaly appreciated. Cor: PMI nondisplaced. Regular rate & rhythm. No rubs, gallops or murmurs. Lungs: clear Abdomen: soft, nontender, nondistended. No hepatosplenomegaly. No bruits or masses. Good bowel sounds. Extremities: no cyanosis, clubbing, rash, edema Neuro: alert & orientedx3, cranial nerves grossly intact. moves all 4 extremities w/o difficulty. Affect pleasant  ASSESSMENT & PLAN: 1. Chronic Systolic HF with biventricular dysfunction - Echo 07/16/18 EF 15-20%Restricitve filling pattern RVEF mild to moderately depressed There was moderate pericardial effusion - RHC/LHC mild non obs CAD, severe NICM, elevated filling pressures.  - 07/22/2018 CMRI completed and showed LVEF 17% , RV EF 28% and LGE in basal septum with possible viral myocarditis.  - Echo 12/20 EF 30-35% - Echo today 04/22/2019 EF 40-45% - Symptomatically stable NYHA I .  -  Continue coreg 12.5 mg tiwce a day.     - Continue spiro to 25mg  daily. - Entresto 97/103 bid -  Continue Jardiance - EF continues to improve. No indication for ICD  - Continues to have hyperkalemia. May need to reduce or stop spiro - Recheck labs today 2. CKD Stage II - resolved 3. DMII - On Jardiance 4. CAD -Nonobstructive CAD . Mid LAD 40% stenoses, Prox RCA, Mid RCA 20%,  Prox CX-Min CX 30 %.  - No s/s ischemia. Continue lipitor, SGLT2i and aspirin. 5. Hyperkalemia - recheck today - may need to stop spiro  Glori Bickers MD 10:46 AM

## 2019-04-22 NOTE — Addendum Note (Signed)
Encounter addended by: Scarlette Calico, RN on: 04/22/2019 11:01 AM  Actions taken: Order list changed, Diagnosis association updated, Charge Capture section accepted, Clinical Note Signed

## 2019-04-22 NOTE — Patient Instructions (Signed)
Labs done today, we will call you for abnormal results  Please call our office in January 2022 to schedule your next appointment  If you have any questions or concerns before your next appointment please send [place] a message through [place] or call our office at [phone].  At the [facility], you and your health needs are our priority. As part of our continuing mission to provide you with exceptional heart care, we have created designated Provider Care Teams. These Care Teams include your primary Cardiologist (physician) and Advanced Practice Providers (APPs- Physician Assistants and Nurse Practitioners) who all work together to provide you with the care you need, when you need it.   You may see any of the following providers on your designated Care Team at your next follow up: Marland Kitchen Dr Glori Bickers . Dr Loralie Champagne . Darrick Grinder, NP . Lyda Jester, PA . Audry Riles, PharmD   Please be sure to bring in all your medications bottles to every appointment.

## 2019-09-17 ENCOUNTER — Other Ambulatory Visit (HOSPITAL_COMMUNITY): Payer: Self-pay | Admitting: Internal Medicine

## 2019-10-08 ENCOUNTER — Other Ambulatory Visit (HOSPITAL_COMMUNITY): Payer: Self-pay | Admitting: Adult Health

## 2020-01-11 ENCOUNTER — Other Ambulatory Visit (HOSPITAL_COMMUNITY): Payer: Self-pay | Admitting: Internal Medicine

## 2020-01-11 ENCOUNTER — Ambulatory Visit (HOSPITAL_COMMUNITY)
Admission: RE | Admit: 2020-01-11 | Discharge: 2020-01-11 | Disposition: A | Discharge status: Home / Self Care | Payer: 59 | Source: Ambulatory Visit | Attending: Internal Medicine | Admitting: Internal Medicine

## 2020-01-11 ENCOUNTER — Ambulatory Visit (HOSPITAL_COMMUNITY): Payer: 59

## 2020-01-11 ENCOUNTER — Other Ambulatory Visit: Payer: Self-pay

## 2020-01-11 DIAGNOSIS — L089 Local infection of the skin and subcutaneous tissue, unspecified: Secondary | ICD-10-CM

## 2020-01-18 ENCOUNTER — Other Ambulatory Visit (HOSPITAL_COMMUNITY): Payer: Self-pay | Admitting: Orthopedic Surgery

## 2020-01-21 ENCOUNTER — Encounter (HOSPITAL_BASED_OUTPATIENT_CLINIC_OR_DEPARTMENT_OTHER): Payer: Self-pay | Admitting: Orthopedic Surgery

## 2020-01-21 ENCOUNTER — Other Ambulatory Visit: Payer: Self-pay

## 2020-01-25 ENCOUNTER — Encounter (HOSPITAL_BASED_OUTPATIENT_CLINIC_OR_DEPARTMENT_OTHER)
Admission: RE | Admit: 2020-01-25 | Discharge: 2020-01-25 | Disposition: A | Discharge status: Home / Self Care | Payer: 59 | Source: Ambulatory Visit | Attending: Orthopedic Surgery | Admitting: Orthopedic Surgery

## 2020-01-25 ENCOUNTER — Other Ambulatory Visit (HOSPITAL_COMMUNITY)
Admission: RE | Admit: 2020-01-25 | Discharge: 2020-01-25 | Disposition: A | Discharge status: Home / Self Care | Payer: 59 | Source: Ambulatory Visit | Attending: Orthopedic Surgery | Admitting: Orthopedic Surgery

## 2020-01-25 DIAGNOSIS — Z01812 Encounter for preprocedural laboratory examination: Secondary | ICD-10-CM | POA: Insufficient documentation

## 2020-01-25 DIAGNOSIS — Z20822 Contact with and (suspected) exposure to covid-19: Secondary | ICD-10-CM | POA: Insufficient documentation

## 2020-01-25 DIAGNOSIS — Z01818 Encounter for other preprocedural examination: Secondary | ICD-10-CM | POA: Insufficient documentation

## 2020-01-25 LAB — BASIC METABOLIC PANEL
Anion gap: 10 (ref 5–15)
BUN: 21 mg/dL — ABNORMAL HIGH (ref 6–20)
CO2: 26 mmol/L (ref 22–32)
Calcium: 9.4 mg/dL (ref 8.9–10.3)
Chloride: 99 mmol/L (ref 98–111)
Creatinine, Ser: 1.2 mg/dL (ref 0.61–1.24)
GFR, Estimated: >60 mL/min (ref >60)
Glucose, Bld: 213 mg/dL — ABNORMAL HIGH (ref 70–99)
Potassium: 5.6 mmol/L — ABNORMAL HIGH (ref 3.5–5.1)
Sodium: 135 mmol/L (ref 135–145)

## 2020-01-25 LAB — SARS CORONAVIRUS 2 (TAT 6-24 HRS): SARS Coronavirus 2: NEGATIVE

## 2020-01-25 NOTE — Progress Notes (Signed)

## 2020-01-25 NOTE — Progress Notes (Signed)
K+ 5.6, Dr. Sabra Heck ordered BMET day of surgery.

## 2020-01-28 ENCOUNTER — Encounter (HOSPITAL_BASED_OUTPATIENT_CLINIC_OR_DEPARTMENT_OTHER): Payer: Self-pay | Admitting: Orthopedic Surgery

## 2020-01-28 ENCOUNTER — Other Ambulatory Visit: Payer: Self-pay

## 2020-01-28 ENCOUNTER — Ambulatory Visit (HOSPITAL_BASED_OUTPATIENT_CLINIC_OR_DEPARTMENT_OTHER)
Admission: RE | Admit: 2020-01-28 | Discharge: 2020-01-28 | Disposition: A | Discharge status: Home / Self Care | Payer: 59 | Attending: Orthopedic Surgery | Admitting: Orthopedic Surgery

## 2020-01-28 ENCOUNTER — Encounter (HOSPITAL_BASED_OUTPATIENT_CLINIC_OR_DEPARTMENT_OTHER)
Admission: RE | Disposition: A | Discharge status: Home / Self Care | Payer: Self-pay | Source: Home / Self Care | Attending: Orthopedic Surgery

## 2020-01-28 ENCOUNTER — Ambulatory Visit (HOSPITAL_BASED_OUTPATIENT_CLINIC_OR_DEPARTMENT_OTHER): Payer: 59 | Admitting: Certified Registered"

## 2020-01-28 DIAGNOSIS — M869 Osteomyelitis, unspecified: Secondary | ICD-10-CM | POA: Diagnosis not present

## 2020-01-28 DIAGNOSIS — Z87891 Personal history of nicotine dependence: Secondary | ICD-10-CM | POA: Insufficient documentation

## 2020-01-28 DIAGNOSIS — Z881 Allergy status to other antibiotic agents status: Secondary | ICD-10-CM | POA: Insufficient documentation

## 2020-01-28 DIAGNOSIS — Z7982 Long term (current) use of aspirin: Secondary | ICD-10-CM | POA: Diagnosis not present

## 2020-01-28 DIAGNOSIS — E1169 Type 2 diabetes mellitus with other specified complication: Secondary | ICD-10-CM | POA: Insufficient documentation

## 2020-01-28 DIAGNOSIS — Z7984 Long term (current) use of oral hypoglycemic drugs: Secondary | ICD-10-CM | POA: Diagnosis not present

## 2020-01-28 DIAGNOSIS — E11621 Type 2 diabetes mellitus with foot ulcer: Secondary | ICD-10-CM | POA: Insufficient documentation

## 2020-01-28 DIAGNOSIS — L97516 Non-pressure chronic ulcer of other part of right foot with bone involvement without evidence of necrosis: Secondary | ICD-10-CM | POA: Insufficient documentation

## 2020-01-28 DIAGNOSIS — Z880 Allergy status to penicillin: Secondary | ICD-10-CM | POA: Insufficient documentation

## 2020-01-28 DIAGNOSIS — Z79899 Other long term (current) drug therapy: Secondary | ICD-10-CM | POA: Diagnosis not present

## 2020-01-28 HISTORY — PX: AMPUTATION TOE: SHX6595

## 2020-01-28 HISTORY — DX: Type 2 diabetes mellitus without complications: E11.9

## 2020-01-28 LAB — BASIC METABOLIC PANEL
Anion gap: 10 (ref 5–15)
BUN: 22 mg/dL — ABNORMAL HIGH (ref 6–20)
CO2: 26 mmol/L (ref 22–32)
Calcium: 9.3 mg/dL (ref 8.9–10.3)
Chloride: 99 mmol/L (ref 98–111)
Creatinine, Ser: 1.2 mg/dL (ref 0.61–1.24)
GFR, Estimated: >60 mL/min (ref >60)
Glucose, Bld: 149 mg/dL — ABNORMAL HIGH (ref 70–99)
Potassium: 5.1 mmol/L (ref 3.5–5.1)
Sodium: 135 mmol/L (ref 135–145)

## 2020-01-28 LAB — GLUCOSE, CAPILLARY
Glucose-Capillary: 135 mg/dL — ABNORMAL HIGH (ref 70–99)
Glucose-Capillary: 128 mg/dL — ABNORMAL HIGH (ref 70–99)

## 2020-01-28 SURGERY — AMPUTATION, TOE
Anesthesia: Monitor Anesthesia Care | Site: Toe | Laterality: Right

## 2020-01-28 MED ORDER — LACTATED RINGERS IV SOLN: INTRAVENOUS | Status: DC

## 2020-01-28 MED ORDER — OXYCODONE HCL 5 MG PO TABS: 5.0000 mg | ORAL_TABLET | Freq: Once | ORAL | Status: DC | PRN

## 2020-01-28 MED ORDER — VANCOMYCIN HCL 500 MG IV SOLR
INTRAVENOUS | Status: DC | PRN
  Administered 2020-01-28: 500 mg via TOPICAL

## 2020-01-28 MED ORDER — MIDAZOLAM HCL 2 MG/2ML IJ SOLN
INTRAMUSCULAR | Status: AC
  Filled 2020-01-28: qty 2

## 2020-01-28 MED ORDER — FENTANYL CITRATE (PF) 100 MCG/2ML IJ SOLN
INTRAMUSCULAR | Status: AC
  Filled 2020-01-28: qty 2

## 2020-01-28 MED ORDER — CEFAZOLIN SODIUM-DEXTROSE 2-4 GM/100ML-% IV SOLN
INTRAVENOUS | Status: AC
  Filled 2020-01-28: qty 100

## 2020-01-28 MED ORDER — LIDOCAINE HCL (PF) 1 % IJ SOLN
INTRAMUSCULAR | Status: AC
  Filled 2020-01-28: qty 30

## 2020-01-28 MED ORDER — PROPOFOL 500 MG/50ML IV EMUL
INTRAVENOUS | Status: DC | PRN
  Administered 2020-01-28: 50 ug/kg/min via INTRAVENOUS

## 2020-01-28 MED ORDER — OXYCODONE HCL 5 MG/5ML PO SOLN: 5.0000 mg | Freq: Once | ORAL | Status: DC | PRN

## 2020-01-28 MED ORDER — ACETAMINOPHEN 500 MG PO TABS
1000.0000 mg | ORAL_TABLET | Freq: Once | ORAL | Status: AC
  Administered 2020-01-28: 1000 mg via ORAL

## 2020-01-28 MED ORDER — FENTANYL CITRATE (PF) 100 MCG/2ML IJ SOLN
INTRAMUSCULAR | Status: DC | PRN
  Administered 2020-01-28: 50 ug via INTRAVENOUS

## 2020-01-28 MED ORDER — ACETAMINOPHEN 500 MG PO TABS
ORAL_TABLET | ORAL | Status: AC
  Filled 2020-01-28: qty 2

## 2020-01-28 MED ORDER — 0.9 % SODIUM CHLORIDE (POUR BTL) OPTIME
TOPICAL | Status: DC | PRN
  Administered 2020-01-28: 120 mL

## 2020-01-28 MED ORDER — PROPOFOL 10 MG/ML IV BOLUS
INTRAVENOUS | Status: AC
  Filled 2020-01-28: qty 20

## 2020-01-28 MED ORDER — ONDANSETRON HCL 4 MG/2ML IJ SOLN
INTRAMUSCULAR | Status: DC | PRN
  Administered 2020-01-28: 4 mg via INTRAVENOUS

## 2020-01-28 MED ORDER — FENTANYL CITRATE (PF) 100 MCG/2ML IJ SOLN: 25.0000 ug | INTRAMUSCULAR | Status: DC | PRN

## 2020-01-28 MED ORDER — SODIUM CHLORIDE 0.9 % IV SOLN: INTRAVENOUS | Status: DC

## 2020-01-28 MED ORDER — BUPIVACAINE-EPINEPHRINE 0.5% -1:200000 IJ SOLN
INTRAMUSCULAR | Status: DC | PRN
  Administered 2020-01-28: 10 mL

## 2020-01-28 MED ORDER — LIDOCAINE HCL 2 % IJ SOLN
INTRAMUSCULAR | Status: AC
  Filled 2020-01-28: qty 20

## 2020-01-28 MED ORDER — CEFAZOLIN SODIUM-DEXTROSE 2-4 GM/100ML-% IV SOLN
2.0000 g | INTRAVENOUS | Status: AC
  Administered 2020-01-28: 2 g via INTRAVENOUS

## 2020-01-28 MED ORDER — PROMETHAZINE HCL 25 MG/ML IJ SOLN: 6.2500 mg | INTRAMUSCULAR | Status: DC | PRN

## 2020-01-28 SURGICAL SUPPLY — 62 items
APL PRP STRL LF DISP 70% ISPRP (MISCELLANEOUS) ×1
BLADE AVERAGE 25X9 (BLADE) IMPLANT
BLADE OSC/SAG .038X5.5 CUT EDG (BLADE) IMPLANT
BLADE SURG 10 STRL SS (BLADE) IMPLANT
BLADE SURG 15 STRL LF DISP TIS (BLADE) ×1 IMPLANT
BLADE SURG 15 STRL SS (BLADE) ×2
BNDG CMPR 9X4 STRL LF SNTH (GAUZE/BANDAGES/DRESSINGS) ×1
BNDG COHESIVE 4X5 TAN STRL (GAUZE/BANDAGES/DRESSINGS) ×2 IMPLANT
BNDG CONFORM 3 STRL LF (GAUZE/BANDAGES/DRESSINGS) IMPLANT
BNDG ESMARK 4X9 LF (GAUZE/BANDAGES/DRESSINGS) ×2 IMPLANT
CHLORAPREP W/TINT 26 (MISCELLANEOUS) ×2 IMPLANT
COVER BACK TABLE 60X90IN (DRAPES) ×2 IMPLANT
COVER WAND RF STERILE (DRAPES) IMPLANT
CUFF TOURN SGL QUICK 24 (TOURNIQUET CUFF)
CUFF TRNQT CYL 24X4X16.5-23 (TOURNIQUET CUFF) IMPLANT
DECANTER SPIKE VIAL GLASS SM (MISCELLANEOUS) IMPLANT
DRAPE EXTREMITY T 121X128X90 (DISPOSABLE) ×2 IMPLANT
DRAPE SURG 17X23 STRL (DRAPES) ×2 IMPLANT
DRAPE U-SHAPE 47X51 STRL (DRAPES) ×2 IMPLANT
DRSG EMULSION OIL 3X3 NADH (GAUZE/BANDAGES/DRESSINGS) IMPLANT
DRSG MEPITEL 4X7.2 (GAUZE/BANDAGES/DRESSINGS) ×2 IMPLANT
DRSG PAD ABDOMINAL 8X10 ST (GAUZE/BANDAGES/DRESSINGS) IMPLANT
ELECT REM PT RETURN 9FT ADLT (ELECTROSURGICAL) ×2
ELECTRODE REM PT RTRN 9FT ADLT (ELECTROSURGICAL) ×1 IMPLANT
GAUZE SPONGE 4X4 12PLY STRL (GAUZE/BANDAGES/DRESSINGS) ×2 IMPLANT
GLOVE BIO SURGEON STRL SZ8 (GLOVE) ×2 IMPLANT
GLOVE ECLIPSE 8.0 STRL XLNG CF (GLOVE) IMPLANT
GLOVE SRG 8 PF TXTR STRL LF DI (GLOVE) ×1 IMPLANT
GLOVE SURG SS PI 7.0 STRL IVOR (GLOVE) ×2 IMPLANT
GLOVE SURG UNDER POLY LF SZ7 (GLOVE) ×4 IMPLANT
GLOVE SURG UNDER POLY LF SZ8 (GLOVE) ×2
GOWN STRL REUS W/ TWL LRG LVL3 (GOWN DISPOSABLE) ×1 IMPLANT
GOWN STRL REUS W/ TWL XL LVL3 (GOWN DISPOSABLE) ×1 IMPLANT
GOWN STRL REUS W/TWL LRG LVL3 (GOWN DISPOSABLE) ×2
GOWN STRL REUS W/TWL XL LVL3 (GOWN DISPOSABLE) ×2
NDL SAFETY ECLIPSE 18X1.5 (NEEDLE) IMPLANT
NEEDLE HYPO 18GX1.5 SHARP (NEEDLE)
NEEDLE HYPO 25X1 1.5 SAFETY (NEEDLE) ×2 IMPLANT
NS IRRIG 1000ML POUR BTL (IV SOLUTION) ×2 IMPLANT
PACK BASIN DAY SURGERY FS (CUSTOM PROCEDURE TRAY) ×2 IMPLANT
PAD CAST 4YDX4 CTTN HI CHSV (CAST SUPPLIES) ×1 IMPLANT
PADDING CAST COTTON 4X4 STRL (CAST SUPPLIES) ×2
PENCIL SMOKE EVACUATOR (MISCELLANEOUS) ×2 IMPLANT
SANITIZER HAND PURELL 535ML FO (MISCELLANEOUS) ×2 IMPLANT
SHEET MEDIUM DRAPE 40X70 STRL (DRAPES) ×2 IMPLANT
SLEEVE SCD COMPRESS KNEE MED (MISCELLANEOUS) ×2 IMPLANT
SPONGE LAP 18X18 RF (DISPOSABLE) ×2 IMPLANT
STOCKINETTE 6  STRL (DRAPES) ×2
STOCKINETTE 6 STRL (DRAPES) ×1 IMPLANT
SUCTION FRAZIER HANDLE 10FR (MISCELLANEOUS)
SUCTION TUBE FRAZIER 10FR DISP (MISCELLANEOUS) IMPLANT
SUT ETHILON 2 0 FS 18 (SUTURE) ×2 IMPLANT
SUT ETHILON 2 0 FSLX (SUTURE) IMPLANT
SUT ETHILON 3 0 PS 1 (SUTURE) IMPLANT
SUT MNCRL AB 3-0 PS2 18 (SUTURE) IMPLANT
SWAB COLLECTION DEVICE MRSA (MISCELLANEOUS) IMPLANT
SWAB CULTURE ESWAB REG 1ML (MISCELLANEOUS) IMPLANT
SYR BULB EAR ULCER 3OZ GRN STR (SYRINGE) ×2 IMPLANT
SYR CONTROL 10ML LL (SYRINGE) ×2 IMPLANT
TOWEL GREEN STERILE FF (TOWEL DISPOSABLE) ×2 IMPLANT
TUBE CONNECTING 20X1/4 (TUBING) IMPLANT
UNDERPAD 30X36 HEAVY ABSORB (UNDERPADS AND DIAPERS) ×2 IMPLANT

## 2020-01-28 NOTE — Op Note (Signed)
01/28/2020  2:40 PM  PATIENT:  Fernando Williams  60 y.o. male  PRE-OPERATIVE DIAGNOSIS: Nonhealing right second toe diabetic ulcer and osteomyelitis  POST-OPERATIVE DIAGNOSIS: Same  Procedure(s):  Right Second toe amputation through the proximal phalanx  SURGEON:  Wylene Simmer, MD  ASSISTANT:   ANESTHESIA:   MAC, regional  EBL:  minimal   TOURNIQUET:   Total Tourniquet Time Documented: Leg (Right) - 8 minutes Total: Leg (Right) - 8 minutes  COMPLICATIONS:  None apparent  DISPOSITION:  Extubated, awake and stable to recovery.  INDICATION FOR PROCEDURE: The patient is a 60 year old male with a history of diabetes.  He stubbed his right second toe a few months ago creating an ulcer.  It has failed to heal, and he has developed osteomyelitis.  He presents now for right second toe amputation.  The risks and benefits of the alternative treatment options have been discussed in detail.  The patient wishes to proceed with surgery and specifically understands risks of bleeding, infection, nerve damage, blood clots, need for additional surgery, amputation and death.  PROCEDURE IN DETAIL:  After pre operative consent was obtained, and the correct operative site was identified, the patient was brought to the operating room and placed supine on the OR table.  Anesthesia was administered.  Pre-operative antibiotics were administered.  A surgical timeout was taken.  The right foot second toe was anesthetized with an intermetatarsal block of quarter percent Marcaine with epinephrine.  The right foot was then prepped and draped in standard sterile fashion.  The foot was exsanguinated with a 4 inch Esmarch which was then wrapped around the ankle as a tourniquet.  A fishmouth incision was marked on the skin at the proximal phalanx at the demarcation point of healthy skin.  The incision was made and dissection was carried down through the subcutaneous tissue sharply to bone.  A bone cutter was used to cut  through the proximal phalanx and the toe was passed off the field.  Neurovascular bundles were cauterized.  The wound was irrigated copiously.  Vancomycin powder was sprinkled in the wound.  The skin incision was closed with simple and horizontal mattress sutures of 2-0 nylon.  Sterile dressings were applied followed by a compression wrap.  The tourniquet was released after application of the dressings.  The patient was awakened from anesthesia and transported to the recovery room in stable condition.   FOLLOW UP PLAN: Weightbearing as tolerated in a flat postop shoe.  Follow-up in the office in 2 weeks for suture removal.

## 2020-01-28 NOTE — H&P (Signed)
Fernando Williams is an 60 y.o. male.   Chief Complaint:  Right toe nonhealing ulcer HPI: The patient is a 59 year old male with a past medical history significant for coronary artery disease and diabetes.  He has a nonhealing ulcer at the tip of the second toe after injuring it months ago.  He has developed osteomyelitis and presents now for right foot second toe amputation.  Past Medical History:  Diagnosis Date  . CHF (congestive heart failure) ([place])   . Diabetes mellitus without complication ([place])   . Pneumonia     Past Surgical History:  Procedure Laterality Date  . FRACTURE SURGERY    . RIGHT/LEFT HEART CATH AND CORONARY ANGIOGRAPHY N/A 07/21/2018   Procedure: RIGHT/LEFT HEART CATH AND CORONARY ANGIOGRAPHY;  Surgeon: Jolaine Artist, MD;  Location: [facility];  Service: Cardiovascular;  Laterality: N/A;    Family History  Problem Relation Age of Onset  . Diabetes Mother   . Cancer Father    Social History:  reports that he quit smoking about 21 months ago. His smoking use included cigars. He has never used smokeless tobacco. He reports that he does not drink alcohol and does not use drugs.  Allergies:  Allergies  Allergen Reactions  . Erythromycin Nausea And Vomiting    "Oxygen level went down"  . Penicillins Other (See Comments)    Did it involve swelling of the face/tongue/throat, SOB, or low BP? No Did it involve sudden or severe rash/hives, skin peeling, or any reaction on the inside of your mouth or nose? Unknown Did you need to seek medical attention at a hospital or doctor's office? Unknown When did it last happen?childhood If all above answers are "NO", may proceed with cephalosporin use.  "[name]" - STATES THIS OCCURS WITH ALL  "[name]"    Medications Prior to Admission  Medication Sig Dispense Refill  . aspirin EC 81 MG EC tablet Take 1 tablet (81 mg total) by mouth daily. 30 tablet 6  . atorvastatin (LIPITOR) 20 MG tablet TAKE 1  TABLET(20 MG) BY MOUTH DAILY AT 6 PM 30 tablet 11  . B Complex-C-Folic Acid (SUPER B COMPLEX/FA/VIT C PO) Take 1 tablet by mouth daily.     . carvedilol (COREG) 12.5 MG tablet Take 1 tablet (12.5 mg total) by mouth 2 (two) times daily with a meal. 180 tablet 3  . doxycycline (VIBRAMYCIN) 100 MG capsule Take 100 mg by mouth 2 (two) times daily.    . empagliflozin (JARDIANCE) 10 MG TABS tablet Take 10 mg by mouth daily before breakfast. 30 tablet 5  . ENTRESTO 97-103 MG TAKE 1 TABLET BY MOUTH TWICE DAILY 60 tablet 6  . glucosamine-chondroitin 500-400 MG tablet Take 2 tablets by mouth daily.     . Magnesium 250 MG TABS Take 1 tablet by mouth every evening.     . metFORMIN (GLUCOPHAGE) 500 MG tablet Take 500 mg by mouth 2 (two) times daily with a meal.    . ONETOUCH VERIO test strip USE TO CHECK BLOOD SUGAR TWICE DAILY    . spironolactone (ALDACTONE) 25 MG tablet Take 1 tablet (25 mg total) by mouth daily. 90 tablet 3  . vitamin B-12 (CYANOCOBALAMIN) 500 MCG tablet Take 1,000 mcg by mouth daily.      Results for orders placed or performed during the hospital encounter of 01/28/20 (from the past 48 hour(s))  Glucose, capillary     Status: Abnormal   Collection Time: 01/28/20  1:10 PM  Result Value Ref Range   Glucose-Capillary 135 (H) 70 - 99 mg/dL    Comment: Glucose reference range applies only to samples taken after fasting for at least 8 hours.   No results found.  Review of Systems no recent fever, chills, nausea, vomiting or changes in his appetite  Blood pressure (!) 141/78, pulse 74, temperature 98.9 F (37.2 C), temperature source Oral, resp. rate 18, height 5\' 9"  (1.753 m), weight 99.8 kg, SpO2 99 %. Physical Exam  Well-nourished well-developed man in no apparent distress.  Alert and oriented x4.  Normal mood and affect.  Gait is normal.  The right foot second toe is swollen and erythematous.  There is an ulcer at the tip of the toe with exposed distal phalanx.  Diminished  instability to light touch at the forefoot dorsally and plantarly.   Assessment/Plan Chronic right foot second toe diabetic ulcer and osteomyelitis -to the operating room today for right second toe amputation.  The risks and benefits of the alternative treatment options have been discussed in detail.  The patient wishes to proceed with surgery and specifically understands risks of bleeding, infection, nerve damage, blood clots, need for additional surgery, amputation and death.   Wylene Simmer, MD Feb 27, 2020, 1:33 PM

## 2020-01-28 NOTE — Discharge Instructions (Signed)
Wylene Simmer, MD [facility]  Please read the following information regarding your care after surgery.  Medications  You only need a prescription for the narcotic pain medicine (ex. oxycodone, Percocet, Norco).  All of the other medicines listed below are available over the counter. X Aleve 1 pill twice a day for the first 3 days after surgery. X acetominophen (Tylenol) 650 mg every 4-6 hours as you need for minor to moderate pain  Weight Bearing ? Bear weight when you are able on your operated leg or foot. X Bear weight only on your operated foot in the post-op shoe. ? Do not bear any weight on the operated leg or foot.  Cast / Splint / Dressing X Keep your splint, cast or dressing clean and dry.  Don't put anything (coat hanger, pencil, etc) down inside of it.  If it gets damp, use a hair dryer on the cool setting to dry it.  If it gets soaked, call the office to schedule an appointment for a cast change. ? Remove your dressing 3 days after surgery and cover the incisions with dry dressings.    After your dressing, cast or splint is removed; you may shower, but do not soak or scrub the wound.  Allow the water to run over it, and then gently pat it dry.  Swelling It is normal for you to have swelling where you had surgery.  To reduce swelling and pain, keep your toes above your nose for at least 3 days after surgery.  It may be necessary to keep your foot or leg elevated for several weeks.  If it hurts, it should be elevated.  Follow Up Call my office at [phone] when you are discharged from the hospital or surgery center to schedule an appointment to be seen two weeks after surgery.  Call my office at [phone] if you develop a fever >101.5 F, nausea, vomiting, bleeding from the surgical site or severe pain.     Post Anesthesia Home Care Instructions  Activity: Get plenty of rest for the remainder of the day. A responsible individual must stay with you for 24 hours following  the procedure.  For the next 24 hours, DO NOT: -Drive a car -Paediatric nurse -Drink alcoholic beverages -Take any medication unless instructed by your physician -Make any legal decisions or sign important papers.  Meals: Start with liquid foods such as gelatin or soup. Progress to regular foods as tolerated. Avoid greasy, spicy, heavy foods. If nausea and/or vomiting occur, drink only clear liquids until the nausea and/or vomiting subsides. Call your physician if vomiting continues.  Special Instructions/Symptoms: Your throat may feel dry or sore from the anesthesia or the breathing tube placed in your throat during surgery. If this causes discomfort, gargle with warm salt water. The discomfort should disappear within 24 hours.  If you had a scopolamine patch placed behind your ear for the management of post- operative nausea and/or vomiting:  1. The medication in the patch is effective for 72 hours, after which it should be removed.  Wrap patch in a tissue and discard in the trash. Wash hands thoroughly with soap and water. 2. You may remove the patch earlier than 72 hours if you experience unpleasant side effects which may include dry mouth, dizziness or visual disturbances. 3. Avoid touching the patch. Wash your hands with soap and water after contact with the patch.

## 2020-01-28 NOTE — Transfer of Care (Signed)
Immediate Anesthesia Transfer of Care Note  Patient: Fernando Williams  Procedure(s) Performed: Right 2nd toe amputation (Right Toe)  Patient Location: PACU  Anesthesia Type:MAC  Level of Consciousness: awake and alert   Airway & Oxygen Therapy: Patient Spontanous Breathing and Patient connected to face mask oxygen  Post-op Assessment: Report given to RN and Post -op Vital signs reviewed and stable  Post vital signs: Reviewed and stable  Last Vitals:  Vitals Value Taken Time  BP    Temp    Pulse 61 01/28/20 1435  Resp 8 01/28/20 1435  SpO2 100 % 01/28/20 1435  Vitals shown include unvalidated device data.  Last Pain:  Vitals:   01/28/20 1308  TempSrc: Oral  PainSc: 0-No pain         Complications: No complications documented.

## 2020-01-28 NOTE — Anesthesia Preprocedure Evaluation (Addendum)
Anesthesia Evaluation  Patient identified by MRN, date of birth, ID band Patient awake    Reviewed: Allergy & Precautions, NPO status , Patient's Chart, lab work & pertinent test results, reviewed documented beta blocker date and time   History of Anesthesia Complications Negative for: history of anesthetic complications  Airway Mallampati: II  TM Distance: >3 FB Neck ROM: Full    Dental  (+) Missing,    Pulmonary neg pulmonary ROS, former smoker,    Pulmonary exam normal        Cardiovascular hypertension, Pt. on medications and Pt. on home beta blockers +CHF  Normal cardiovascular exam  TTE 04/2019: EF 40-45%, global hypokinesis, moderate LV dilation, moderate asymmetric left ventricular hypertrophy of the basal-septal segment, grade I DD   Neuro/Psych negative neurological ROS  negative psych ROS   GI/Hepatic negative GI ROS, Neg liver ROS,   Endo/Other  diabetes, Type 2, Oral Hypoglycemic Agents  Renal/GU negative Renal ROS  negative genitourinary   Musculoskeletal negative musculoskeletal ROS (+)   Abdominal   Peds  Hematology negative hematology ROS (+)   Anesthesia Other Findings Day of surgery medications reviewed with patient.  Reproductive/Obstetrics negative OB ROS                            Anesthesia Physical Anesthesia Plan  ASA: II  Anesthesia Plan: Regional and MAC   Post-op Pain Management:    Induction:   PONV Risk Score and Plan: 1 and Treatment may vary due to age or medical condition, Ondansetron and Midazolam  Airway Management Planned: Natural Airway and Simple Face Mask  Additional Equipment: None  Intra-op Plan:   Post-operative Plan:   Informed Consent: I have reviewed the patients History and Physical, chart, labs and discussed the procedure including the risks, benefits and alternatives for the proposed anesthesia with the patient or authorized  representative who has indicated his/her understanding and acceptance.       Plan Discussed with: CRNA  Anesthesia Plan Comments:        Anesthesia Quick Evaluation

## 2020-01-29 ENCOUNTER — Encounter (HOSPITAL_BASED_OUTPATIENT_CLINIC_OR_DEPARTMENT_OTHER): Payer: Self-pay | Admitting: Orthopedic Surgery

## 2020-01-29 NOTE — Anesthesia Postprocedure Evaluation (Signed)
Anesthesia Post Note  Patient: Fernando Williams  Procedure(s) Performed: Right Second toe amputation (Right Toe)     Patient location during evaluation: PACU Anesthesia Type: Regional and MAC Level of consciousness: awake and alert and oriented Pain management: pain level controlled Vital Signs Assessment: post-procedure vital signs reviewed and stable Respiratory status: spontaneous breathing, nonlabored ventilation and respiratory function stable Cardiovascular status: blood pressure returned to baseline Postop Assessment: no apparent nausea or vomiting Anesthetic complications: no   No complications documented.  Last Vitals:  Vitals:   01/28/20 1501 01/28/20 1513  BP:  109/65  Pulse: 63 65  Resp: 16 18  Temp:  36.7 C  SpO2: 99% 96%    Last Pain:  Vitals:   01/28/20 1513  TempSrc:   PainSc: 0-No pain   Pain Goal:                   Brennan Bailey

## 2020-02-06 ENCOUNTER — Other Ambulatory Visit (HOSPITAL_COMMUNITY): Payer: Self-pay | Admitting: Internal Medicine

## 2020-02-08 MED ORDER — SPIRONOLACTONE 25 MG PO TABS: ORAL_TABLET | ORAL | 3 refills | Status: DC

## 2020-02-08 NOTE — Addendum Note (Signed)
Addended by: Shela Nevin R on: 6/44/0347 03:16 PM   Modules accepted: Orders

## 2020-03-04 ENCOUNTER — Other Ambulatory Visit (HOSPITAL_COMMUNITY): Payer: Self-pay

## 2020-03-04 MED ORDER — CARVEDILOL 12.5 MG PO TABS: 12.5000 mg | ORAL_TABLET | Freq: Two times a day (BID) | ORAL | 0 refills | Status: DC

## 2020-05-05 ENCOUNTER — Other Ambulatory Visit (HOSPITAL_COMMUNITY): Payer: Self-pay | Admitting: Internal Medicine

## 2020-06-05 ENCOUNTER — Other Ambulatory Visit (HOSPITAL_COMMUNITY): Payer: Self-pay | Admitting: Internal Medicine

## 2020-06-06 ENCOUNTER — Encounter (HOSPITAL_COMMUNITY): Payer: Self-pay | Admitting: Cardiology

## 2020-07-05 ENCOUNTER — Other Ambulatory Visit (HOSPITAL_COMMUNITY): Payer: Self-pay | Admitting: Internal Medicine

## 2020-07-28 ENCOUNTER — Other Ambulatory Visit (HOSPITAL_COMMUNITY): Payer: Self-pay | Admitting: Internal Medicine

## 2020-08-05 ENCOUNTER — Other Ambulatory Visit (HOSPITAL_COMMUNITY): Payer: Self-pay | Admitting: Internal Medicine

## 2020-09-06 ENCOUNTER — Encounter (HOSPITAL_COMMUNITY): Payer: Self-pay | Admitting: Internal Medicine

## 2020-09-06 ENCOUNTER — Ambulatory Visit (HOSPITAL_COMMUNITY)
Admission: RE | Admit: 2020-09-06 | Discharge: 2020-09-06 | Disposition: A | Discharge status: Home / Self Care | Payer: 59 | Source: Ambulatory Visit | Attending: Internal Medicine | Admitting: Internal Medicine

## 2020-09-06 ENCOUNTER — Other Ambulatory Visit: Payer: Self-pay

## 2020-09-06 VITALS — BP 130/78 | HR 72 | Wt 233.2 lb

## 2020-09-06 DIAGNOSIS — Z7982 Long term (current) use of aspirin: Secondary | ICD-10-CM | POA: Insufficient documentation

## 2020-09-06 DIAGNOSIS — I2510 Atherosclerotic heart disease of native coronary artery without angina pectoris: Secondary | ICD-10-CM

## 2020-09-06 DIAGNOSIS — E11621 Type 2 diabetes mellitus with foot ulcer: Secondary | ICD-10-CM | POA: Insufficient documentation

## 2020-09-06 DIAGNOSIS — I428 Other cardiomyopathies: Secondary | ICD-10-CM | POA: Insufficient documentation

## 2020-09-06 DIAGNOSIS — I313 Pericardial effusion (noninflammatory): Secondary | ICD-10-CM | POA: Insufficient documentation

## 2020-09-06 DIAGNOSIS — E669 Obesity, unspecified: Secondary | ICD-10-CM | POA: Insufficient documentation

## 2020-09-06 DIAGNOSIS — Z87891 Personal history of nicotine dependence: Secondary | ICD-10-CM | POA: Diagnosis not present

## 2020-09-06 DIAGNOSIS — Z79899 Other long term (current) drug therapy: Secondary | ICD-10-CM | POA: Diagnosis not present

## 2020-09-06 DIAGNOSIS — E875 Hyperkalemia: Secondary | ICD-10-CM | POA: Diagnosis not present

## 2020-09-06 DIAGNOSIS — I5022 Chronic systolic (congestive) heart failure: Secondary | ICD-10-CM | POA: Insufficient documentation

## 2020-09-06 DIAGNOSIS — Z7901 Long term (current) use of anticoagulants: Secondary | ICD-10-CM | POA: Diagnosis not present

## 2020-09-06 DIAGNOSIS — Z09 Encounter for follow-up examination after completed treatment for conditions other than malignant neoplasm: Secondary | ICD-10-CM | POA: Diagnosis not present

## 2020-09-06 DIAGNOSIS — Z7984 Long term (current) use of oral hypoglycemic drugs: Secondary | ICD-10-CM | POA: Insufficient documentation

## 2020-09-06 LAB — COMPREHENSIVE METABOLIC PANEL
ALT: 31 U/L (ref 0–44)
AST: 20 U/L (ref 15–41)
Albumin: 4.2 g/dL (ref 3.5–5.0)
Alkaline Phosphatase: 46 U/L (ref 38–126)
Anion gap: 7 (ref 5–15)
BUN: 24 mg/dL — ABNORMAL HIGH (ref 8–23)
CO2: 23 mmol/L (ref 22–32)
Calcium: 9.9 mg/dL (ref 8.9–10.3)
Chloride: 103 mmol/L (ref 98–111)
Creatinine, Ser: 1.02 mg/dL (ref 0.61–1.24)
GFR, Estimated: >60 mL/min (ref >60)
Glucose, Bld: 141 mg/dL — ABNORMAL HIGH (ref 70–99)
Potassium: 5.5 mmol/L — ABNORMAL HIGH (ref 3.5–5.1)
Sodium: 133 mmol/L — ABNORMAL LOW (ref 135–145)
Total Bilirubin: 0.8 mg/dL (ref 0.3–1.2)
Total Protein: 7.5 g/dL (ref 6.5–8.1)

## 2020-09-06 LAB — LIPID PANEL
Cholesterol: 195 mg/dL (ref 0–200)
HDL: 34 mg/dL — ABNORMAL LOW (ref >40)
LDL Cholesterol: 112 mg/dL — ABNORMAL HIGH (ref 0–99)
Total CHOL/HDL Ratio: 5.7 RATIO
Triglycerides: 246 mg/dL — ABNORMAL HIGH (ref <150)
VLDL: 49 mg/dL — ABNORMAL HIGH (ref 0–40)

## 2020-09-06 NOTE — Patient Instructions (Signed)
Labs done today, we will call you for abnormal results  Your physician has requested that you have an echocardiogram. Echocardiography is a painless test that uses sound waves to create images of your heart. It provides your doctor with information about the size and shape of your heart and how well your heart's chambers and valves are working. This procedure takes approximately one hour. There are no restrictions for this procedure.  Please call our office in January 2023 to schedule your follow up appointment  If you have any questions or concerns before your next appointment please send [place] a message through [place] or call our office at [phone].    TO LEAVE A MESSAGE FOR THE NURSE SELECT OPTION 2, PLEASE LEAVE A MESSAGE INCLUDING: YOUR NAME DATE OF BIRTH CALL BACK NUMBER REASON FOR CALL**this is important as we prioritize the call backs  YOU WILL RECEIVE A CALL BACK THE SAME DAY AS LONG AS YOU CALL BEFORE 4:00 PM  At the [facility], you and your health needs are our priority. As part of our continuing mission to provide you with exceptional heart care, we have created designated Provider Care Teams. These Care Teams include your primary Cardiologist (physician) and Advanced Practice Providers (APPs- Physician Assistants and Nurse Practitioners) who all work together to provide you with the care you need, when you need it.   You may see any of the following providers on your designated Care Team at your next follow up: Dr Glori Bickers Dr Loralie Champagne Dr Patrice Paradise, NP Lyda Jester, [place] Ginnie Smart Audry Riles, PharmD   Please be sure to bring in all your medications bottles to every appointment.

## 2020-09-06 NOTE — Addendum Note (Signed)
Encounter addended by: Scarlette Calico, RN on: 09/06/2020 11:47 AM  Actions taken: Order list changed, Diagnosis association updated, Clinical Note Signed, Charge Capture section accepted

## 2020-09-06 NOTE — Progress Notes (Signed)
ADVANCED HF CLINIC NOTE PCP: Dr Luan Pulling Primary HF Cardiologist: Dr Haroldine Laws   HPI: Fernando Williams is a 60 y.o. male with hx of tobacco abuse, SOB, obesity and systolic HF    In [id number] admitted with acute systolic heart failure. Diuresed > 20 pounds and underwent cath that showed NICM, min obstructive cad, and mild-mod elevated filling pressures.cMRI completed  LVEF 17% , RV EF 28% and LGE in basal septum with possible viral myocarditis. HF medications initiated.   We have not seen him since 4/21. In 1/22 underwent right 101m toe amputation for nonhealing diabetic ulcer and osteomyelitis  Today he returns for HF follow up. Continue to work FT at [employer]. Feels great. No CP, SOB, orthopnea or PND. No problems with meds.  Echo 4/21 EF 40-45% RV normal Personally reviewed  Echo 12/20  EF 25-30%  ECHO 07/16/2018 EF 15-20%   CMRI 07/23/2018  1.  Severely dilated LV with EF 17%, diffuse hypokinesis.  2. Mildly dilated RV with EF 28%, moderate to severe systolic dysfunction.  3. Primarily mid-wall LGE in the basal septum, but also rim of subendocardial LGE in the basal inferolateral wall. This is probably a non-coronary pattern, possible prior viral myocarditis.  ROS: All systems negative except as listed in HPI, PMH and Problem List.  SH:  Social History   Socioeconomic History   Marital status: Single    Spouse name: Not on file   Number of children: Not on file   Years of education: Not on file   Highest education level: Not on file  Occupational History   Not on file  Tobacco Use   Smoking status: Former    Types: Cigars    Quit date: 04/07/2018    Years since quitting: 2.4   Smokeless tobacco: Never  Vaping Use   Vaping Use: Never used  Substance and Sexual Activity   Alcohol use: No   Drug use: No   Sexual activity: Yes    Birth control/protection: None  Other Topics Concern   Not on file  Social History Narrative   Not on file   Social Determinants of Health    Financial Resource Strain: Not on file  Food Insecurity: Not on file  Transportation Needs: Not on file  Physical Activity: Not on file  Stress: Not on file  Social Connections: Not on file  Intimate Partner Violence: Not on file    FH:  Family History  Problem Relation Age of Onset   Diabetes Mother    Cancer Father     Past Medical History:  Diagnosis Date   [place]    Diabetes mellitus without complication ([facility])    Pneumonia     Current Outpatient Medications  Medication Sig Dispense Refill   aspirin EC 81 MG EC tablet Take 1 tablet (81 mg total) by mouth daily. 30 tablet 6   B Complex-C-Folic Acid (SUPER B COMPLEX/FA/VIT C PO) Take 1 tablet by mouth daily.      carvedilol (COREG) 12.5 MG tablet TAKE 1 TABLET(12.5 MG) BY MOUTH TWICE DAILY WITH A MEAL 60 tablet 0   empagliflozin (JARDIANCE) 10 MG TABS tablet Take 10 mg by mouth daily before breakfast. 30 tablet 5   ENTRESTO 97-103 MG TAKE 1 TABLET BY MOUTH TWICE DAILY 90 tablet 0   glucosamine-chondroitin 500-400 MG tablet Take 2 tablets by mouth daily.      metFORMIN (GLUCOPHAGE) 500 MG tablet Take 500 mg by mouth 2 (two) times  daily with a meal.     ONETOUCH VERIO test strip USE TO CHECK BLOOD SUGAR TWICE DAILY     spironolactone (ALDACTONE) 25 MG tablet TAKE 1 TABLET(25 MG) BY MOUTH DAILY 90 tablet 3   vitamin B-12 (CYANOCOBALAMIN) 500 MCG tablet Take 1,000 mcg by mouth daily.     No current facility-administered medications for this encounter.    Vitals:   09/06/20 1111  BP: 130/78  Pulse: 72  SpO2: 97%  Weight: 105.8 kg (233 lb 3.2 oz)    Wt Readings from Last 3 Encounters:  09/06/20 105.8 kg (233 lb 3.2 oz)  01/28/20 99.8 kg (220 lb 0.3 oz)  04/22/19 104.3 kg (230 lb)    PHYSICAL EXAM: General:  Well appearing. No resp difficulty HEENT: normal Neck: supple. no JVD. Carotids 2+ bilat; no bruits. No lymphadenopathy or thryomegaly appreciated. Cor: PMI nondisplaced.  Regular rate & rhythm. No rubs, gallops or murmurs. Lungs: clear Abdomen: soft, nontender, nondistended. No hepatosplenomegaly. No bruits or masses. Good bowel sounds. Extremities: no cyanosis, clubbing, rash, edema Neuro: alert & orientedx3, cranial nerves grossly intact. moves all 4 extremities w/o difficulty. Affect pleasant   ASSESSMENT & PLAN:  1. Chronic Systolic HF with biventricular dysfunction - Echo 07/16/18 EF 15-20%  Restricitve filling pattern   RVEF mild to moderately depressed  There was moderate pericardial effusion   - RHC/LHC mild non obs CAD, severe NICM, elevated filling pressures.  - 07/22/2018 CMRI completed and showed LVEF 17% , RV EF 28% and LGE in basal septum with possible viral myocarditis.  - Echo 12/20 EF 30-35% - Echo 04/22/2019 EF 40-45% - Symptomatically stable NYHA I - Continue coreg 12.5 mg tiwce a day.     - Continue spiro to 25 mg daily.  - Entresto 97/103 bid - Continue Jardiance - EF continues to improve. No indication for ICD  - Needs repeat echo  - Doing very well. Will repeat echo to assess for full recovery of EF. He is interested in cutting back some meds if possible. If echo completely normal may be able to cut back some with the knowledge that there may be 40% chance of recurrence. If EF is not completely normalized would continue current regimen.   2. DMII - On Jardiance - Followed by Dr. Nevada Crane  3. CAD - Nonobstructive CAD . Mid LAD 40% stenoses, Prox RCA, Mid RCA 20%, Prox CX-Min CX 30 %.  - No s/s ischemia Continue lipitor, SGLT2i and aspirin.  4. Hyperkalemia - recheck today  Glori Bickers MD 11:34 AM

## 2020-09-14 ENCOUNTER — Other Ambulatory Visit (HOSPITAL_COMMUNITY): Payer: Self-pay

## 2020-09-14 MED ORDER — CARVEDILOL 12.5 MG PO TABS: ORAL_TABLET | ORAL | 1 refills | Status: DC

## 2020-09-20 ENCOUNTER — Other Ambulatory Visit (HOSPITAL_COMMUNITY): Payer: Self-pay | Admitting: Internal Medicine

## 2020-09-23 ENCOUNTER — Other Ambulatory Visit: Payer: Self-pay

## 2020-09-23 ENCOUNTER — Ambulatory Visit (HOSPITAL_COMMUNITY)
Admission: RE | Admit: 2020-09-23 | Discharge: 2020-09-23 | Disposition: A | Discharge status: Home / Self Care | Payer: 59 | Source: Ambulatory Visit | Attending: Pulmonary Disease | Admitting: Pulmonary Disease

## 2020-09-23 DIAGNOSIS — I700 Atherosclerosis of aorta: Secondary | ICD-10-CM | POA: Diagnosis not present

## 2020-09-23 DIAGNOSIS — I5022 Chronic systolic (congestive) heart failure: Secondary | ICD-10-CM | POA: Insufficient documentation

## 2020-09-23 DIAGNOSIS — E119 Type 2 diabetes mellitus without complications: Secondary | ICD-10-CM | POA: Insufficient documentation

## 2020-09-23 LAB — ECHOCARDIOGRAM COMPLETE
Area-P 1/2: 2.22 cm2
S' Lateral: 4.1 cm

## 2020-09-23 NOTE — Progress Notes (Signed)
Echocardiogram 2D Echocardiogram has been performed.  Oneal Deputy Fidelia Cathers RDCS 09/23/2020, 8:40 AM

## 2020-11-06 ENCOUNTER — Other Ambulatory Visit (HOSPITAL_COMMUNITY): Payer: Self-pay | Admitting: Internal Medicine

## 2020-11-11 ENCOUNTER — Other Ambulatory Visit (HOSPITAL_COMMUNITY): Payer: Self-pay

## 2020-11-11 NOTE — Telephone Encounter (Signed)
A user error has taken place: encounter opened in error, closed for administrative reasons.

## 2020-11-19 ENCOUNTER — Other Ambulatory Visit (HOSPITAL_COMMUNITY): Payer: Self-pay | Admitting: Internal Medicine

## 2021-02-07 ENCOUNTER — Other Ambulatory Visit (HOSPITAL_COMMUNITY): Payer: Self-pay | Admitting: Internal Medicine

## 2021-03-09 ENCOUNTER — Ambulatory Visit (HOSPITAL_COMMUNITY)
Admission: RE | Admit: 2021-03-09 | Discharge: 2021-03-09 | Disposition: A | Discharge status: Home / Self Care | Payer: 59 | Source: Ambulatory Visit | Attending: Internal Medicine | Admitting: Internal Medicine

## 2021-03-09 ENCOUNTER — Encounter (HOSPITAL_COMMUNITY): Payer: Self-pay | Admitting: Internal Medicine

## 2021-03-09 ENCOUNTER — Other Ambulatory Visit: Payer: Self-pay

## 2021-03-09 VITALS — BP 126/70 | HR 68 | Ht 69.0 in | Wt 216.8 lb

## 2021-03-09 DIAGNOSIS — I3139 Other pericardial effusion (noninflammatory): Secondary | ICD-10-CM | POA: Insufficient documentation

## 2021-03-09 DIAGNOSIS — E782 Mixed hyperlipidemia: Secondary | ICD-10-CM | POA: Diagnosis not present

## 2021-03-09 DIAGNOSIS — I2510 Atherosclerotic heart disease of native coronary artery without angina pectoris: Secondary | ICD-10-CM | POA: Insufficient documentation

## 2021-03-09 DIAGNOSIS — I428 Other cardiomyopathies: Secondary | ICD-10-CM | POA: Insufficient documentation

## 2021-03-09 DIAGNOSIS — R0602 Shortness of breath: Secondary | ICD-10-CM | POA: Diagnosis not present

## 2021-03-09 DIAGNOSIS — E875 Hyperkalemia: Secondary | ICD-10-CM | POA: Insufficient documentation

## 2021-03-09 DIAGNOSIS — Z89421 Acquired absence of other right toe(s): Secondary | ICD-10-CM | POA: Diagnosis not present

## 2021-03-09 DIAGNOSIS — Z87891 Personal history of nicotine dependence: Secondary | ICD-10-CM | POA: Diagnosis not present

## 2021-03-09 DIAGNOSIS — E119 Type 2 diabetes mellitus without complications: Secondary | ICD-10-CM | POA: Diagnosis not present

## 2021-03-09 DIAGNOSIS — Z79899 Other long term (current) drug therapy: Secondary | ICD-10-CM | POA: Insufficient documentation

## 2021-03-09 DIAGNOSIS — Z7984 Long term (current) use of oral hypoglycemic drugs: Secondary | ICD-10-CM | POA: Diagnosis not present

## 2021-03-09 DIAGNOSIS — I5022 Chronic systolic (congestive) heart failure: Secondary | ICD-10-CM | POA: Diagnosis present

## 2021-03-09 DIAGNOSIS — E669 Obesity, unspecified: Secondary | ICD-10-CM | POA: Diagnosis not present

## 2021-03-09 LAB — COMPREHENSIVE METABOLIC PANEL
ALT: 26 U/L (ref 0–44)
AST: 20 U/L (ref 15–41)
Albumin: 4.1 g/dL (ref 3.5–5.0)
Alkaline Phosphatase: 43 U/L (ref 38–126)
Anion gap: 10 (ref 5–15)
BUN: 21 mg/dL (ref 8–23)
CO2: 22 mmol/L (ref 22–32)
Calcium: 9.5 mg/dL (ref 8.9–10.3)
Chloride: 102 mmol/L (ref 98–111)
Creatinine, Ser: 1.11 mg/dL (ref 0.61–1.24)
GFR, Estimated: >60 mL/min (ref >60)
Glucose, Bld: 115 mg/dL — ABNORMAL HIGH (ref 70–99)
Potassium: 4.8 mmol/L (ref 3.5–5.1)
Sodium: 134 mmol/L — ABNORMAL LOW (ref 135–145)
Total Bilirubin: 0.8 mg/dL (ref 0.3–1.2)
Total Protein: 7.3 g/dL (ref 6.5–8.1)

## 2021-03-09 LAB — LIPID PANEL
Cholesterol: 191 mg/dL (ref 0–200)
HDL: 36 mg/dL — ABNORMAL LOW (ref >40)
LDL Cholesterol: 122 mg/dL — ABNORMAL HIGH (ref 0–99)
Total CHOL/HDL Ratio: 5.3 RATIO
Triglycerides: 166 mg/dL — ABNORMAL HIGH (ref <150)
VLDL: 33 mg/dL (ref 0–40)

## 2021-03-09 LAB — BRAIN NATRIURETIC PEPTIDE: B Natriuretic Peptide: 47.6 pg/mL (ref 0.0–100.0)

## 2021-03-09 NOTE — Patient Instructions (Signed)
Thank you for your visit today.  There has been no changes to your medications.  Labs done today, your results will be available in MyChart, we will contact you for abnormal readings.  Your physician has requested that you have an echocardiogram. Echocardiography is a painless test that uses sound waves to create images of your heart. It provides your doctor with information about the size and shape of your heart and how well your hearts chambers and valves are working. This procedure takes approximately one hour. There are no restrictions for this procedure.  Your physician recommends that you schedule a follow-up appointment in: 1 year (February 2024) with an Echocardiogram. **please call the office in December 2023 to arrange your follow up appointment**  If you have any questions or concerns before your next appointment please send [place] a message through [place] or call our office at [phone].    TO LEAVE A MESSAGE FOR THE NURSE SELECT OPTION 2, PLEASE LEAVE A MESSAGE INCLUDING: YOUR NAME DATE OF BIRTH CALL BACK NUMBER REASON FOR CALL**this is important as we prioritize the call backs  YOU WILL RECEIVE A CALL BACK THE SAME DAY AS LONG AS YOU CALL BEFORE 4:00 PM  At the [facility], you and your health needs are our priority. As part of our continuing mission to provide you with exceptional heart care, we have created designated Provider Care Teams. These Care Teams include your primary Cardiologist (physician) and Advanced Practice Providers (APPs- Physician Assistants and Nurse Practitioners) who all work together to provide you with the care you need, when you need it.   You may see any of the following providers on your designated Care Team at your next follow up: Dr Glori Bickers Dr Haynes Kerns, NP Lyda Jester, [place] [facility] [place], [place] Audry Riles, PharmD   Please be sure to bring in all your medications bottles to every  appointment.

## 2021-03-09 NOTE — Progress Notes (Signed)
ADVANCED HF CLINIC NOTE PCP: Dr Luan Pulling Primary HF Cardiologist: Dr Haroldine Laws   HPI: Fernando Williams is a 61 y.o. male with hx of tobacco abuse, SOB, obesity and systolic HF    In 9/83 admitted with acute systolic heart failure. Diuresed > 20 pounds and underwent cath that showed NICM, min obstructive cad, and mild-mod elevated filling pressures.cMRI completed  LVEF 17% , RV EF 28% and LGE in basal septum with possible viral myocarditis. HF medications initiated.   We have not seen him since 4/21. In 1/22 underwent right 62md toe amputation for nonhealing diabetic ulcer and osteomyelitis  Today he returns for HF follow up. Continue to work FT at [employer]. Feels great. Denies CP, SOB, orthopnea or PND.   Echo 8/22 EF 40-45% with inferior HK Personally reviewed  Echo 4/21 EF 40-45% RV normal Personally reviewed Echo 12/20  EF 25-30%  ECHO 07/16/2018 EF 15-20%   CMRI 07/23/2018  1.  Severely dilated LV with EF 17%, diffuse hypokinesis.  2. Mildly dilated RV with EF 28%, moderate to severe systolic dysfunction.  3. Primarily mid-wall LGE in the basal septum, but also rim of subendocardial LGE in the basal inferolateral wall. This is probably a non-coronary pattern, possible prior viral myocarditis.  ROS: All systems negative except as listed in HPI, PMH and Problem List.  SH:  Social History   Socioeconomic History   Marital status: Single    Spouse name: Not on file   Number of children: Not on file   Years of education: Not on file   Highest education level: Not on file  Occupational History   Not on file  Tobacco Use   Smoking status: Former    Types: Cigars    Quit date: 04/07/2018    Years since quitting: 2.9   Smokeless tobacco: Never  Vaping Use   Vaping Use: Never used  Substance and Sexual Activity   Alcohol use: No   Drug use: No   Sexual activity: Yes    Birth control/protection: None  Other Topics Concern   Not on file  Social History Narrative   Not  on file   Social Determinants of Health   Financial Resource Strain: Not on file  Food Insecurity: Not on file  Transportation Needs: Not on file  Physical Activity: Not on file  Stress: Not on file  Social Connections: Not on file  Intimate Partner Violence: Not on file    FH:  Family History  Problem Relation Age of Onset   Diabetes Mother    Cancer Father     Past Medical History:  Diagnosis Date   CHF (congestive heart failure) ([place])    Diabetes mellitus without complication (HCC)    Pneumonia     Current Outpatient Medications  Medication Sig Dispense Refill   aspirin EC 81 MG EC tablet Take 1 tablet (81 mg total) by mouth daily. 30 tablet 6   carvedilol (COREG) 12.5 MG tablet Take 1 tablet (12.5 mg total) by mouth 2 (two) times daily with a meal. 180 tablet 3   empagliflozin (JARDIANCE) 10 MG TABS tablet Take 10 mg by mouth daily before breakfast. 30 tablet 5   metFORMIN (GLUCOPHAGE) 500 MG tablet Take 500 mg by mouth 2 (two) times daily with a meal.     ONETOUCH VERIO test strip USE TO CHECK BLOOD SUGAR TWICE DAILY     sacubitril-valsartan (ENTRESTO) 97-103 MG Take 1 tablet by mouth 2 (two) times daily. 180 tablet 3  spironolactone (ALDACTONE) 25 MG tablet TAKE 1 TABLET(25 MG) BY MOUTH DAILY 90 tablet 3   B Complex-C-Folic Acid (SUPER B COMPLEX/FA/VIT C PO) Take 1 tablet by mouth daily.  (Patient not taking: Reported on 03/09/2021)     glucosamine-chondroitin 500-400 MG tablet Take 2 tablets by mouth daily.  (Patient not taking: Reported on 03/09/2021)     vitamin B-12 (CYANOCOBALAMIN) 500 MCG tablet Take 1,000 mcg by mouth daily. (Patient not taking: Reported on 03/09/2021)     No current facility-administered medications for this encounter.    Vitals:   03/09/21 1017  BP: 126/70  Pulse: 68  SpO2: 98%  Weight: 98.3 kg (216 lb 12.8 oz)  Height: 5\' 9"  (1.753 m)    Wt Readings from Last 3 Encounters:  03/09/21 98.3 kg (216 lb 12.8 oz)  09/06/20 105.8 kg (233  lb 3.2 oz)  01/28/20 99.8 kg (220 lb 0.3 oz)    PHYSICAL EXAM: General:  Well appearing. No resp difficulty HEENT: normal Neck: supple. no JVD. Carotids 2+ bilat; no bruits. No lymphadenopathy or thryomegaly appreciated. Cor: PMI nondisplaced. Regular rate & rhythm. No rubs, gallops or murmurs. Lungs: clear Abdomen: soft, nontender, nondistended. No hepatosplenomegaly. No bruits or masses. Good bowel sounds. Extremities: no cyanosis, clubbing, rash, edema Neuro: alert & orientedx3, cranial nerves grossly intact. moves all 4 extremities w/o difficulty. Affect pleasant   ASSESSMENT & PLAN:  1. Chronic Systolic HF with biventricular dysfunction - Echo 07/16/18 EF 15-20%  Restricitve filling pattern   RVEF mild to moderately depressed  There was moderate pericardial effusion   - RHC/LHC mild non obs CAD, severe NICM, elevated filling pressures.  - 07/22/2018 CMRI completed and showed LVEF 17% , RV EF 28% and LGE in basal septum with possible viral myocarditis.  - Echo 12/20 EF 30-35% - Echo 04/22/2019 EF 40-45% - Echo 8/22 EF 40-45% with inferior HK Personally reviewed - Symptomatically stable NYHA I - Continue coreg 12.5 mg tiwce a day.     - Continue spiro to 25 mg daily.  - Entresto 97/103 bid - Continue Jardiance - Doing very well. EF seems to be stuck at 40-45%. Suspect he has some scarring from myocarditis.  2. DMII - On Jardiance - Followed by PCP  3. CAD - Nonobstructive CAD . Mid LAD 40% stenoses, Prox RCA, Mid RCA 20%, Prox CX-Min CX 30 %.  - No s/s ischemia Continue lipitor, SGLT2i and aspirin. - Goal LDL < 70  4. Hyperkalemia - recheck today - If K > 5.3, will cut spiro back  Glori Bickers MD 10:54 AM

## 2021-03-09 NOTE — Addendum Note (Signed)
Encounter addended by: Jerl Mina, RN on: 03/09/2021 11:07 AM  Actions taken: Order list changed, Diagnosis association updated, Clinical Note Signed, Charge Capture section accepted

## 2021-06-12 ENCOUNTER — Other Ambulatory Visit (HOSPITAL_COMMUNITY): Payer: Self-pay | Admitting: Internal Medicine

## 2021-08-29 ENCOUNTER — Telehealth (HOSPITAL_COMMUNITY): Payer: Self-pay | Admitting: *Deleted

## 2021-08-29 NOTE — Telephone Encounter (Signed)
Pt called stating his pcp checked labs and his K was 6.1 they want to stop spironolactone. Pt wanted to make sure that was ok before he stopped.  Routed to [place]

## 2021-08-30 ENCOUNTER — Other Ambulatory Visit (HOSPITAL_COMMUNITY): Payer: Self-pay | Admitting: *Deleted

## 2021-08-30 NOTE — Telephone Encounter (Signed)
Pt aware. Lab ordered mailed to pt.

## 2021-08-31 ENCOUNTER — Ambulatory Visit
Admission: EM | Admit: 2021-08-31 | Discharge: 2021-08-31 | Disposition: A | Discharge status: Home / Self Care | Payer: 59 | Attending: Family Medicine | Admitting: Family Medicine

## 2021-08-31 ENCOUNTER — Encounter: Payer: Self-pay | Admitting: Emergency Medicine

## 2021-08-31 DIAGNOSIS — S39012A Strain of muscle, fascia and tendon of lower back, initial encounter: Secondary | ICD-10-CM | POA: Diagnosis not present

## 2021-08-31 MED ORDER — MELOXICAM 15 MG PO TABS
15.0000 mg | ORAL_TABLET | Freq: Every day | ORAL | 0 refills | Status: DC
Start: 2021-08-31 — End: 2022-03-07

## 2021-08-31 MED ORDER — CYCLOBENZAPRINE HCL 10 MG PO TABS: ORAL_TABLET | ORAL | 0 refills | Status: DC

## 2021-08-31 NOTE — ED Triage Notes (Signed)
Pt presents with low back pain after feeling pain this morning while lifting heavy object at work.

## 2021-08-31 NOTE — Discharge Instructions (Addendum)
HOME CARE INSTRUCTIONS: I appears you have strained or pulled a muscle in your lower back. Since low back pain is rarely dangerous, it is often a condition that people can learn to manage on their own. Please remain active. It is stressful on the back to sit or stand in one place. Do not sit, drive, or stand in one place for more than 30 minutes at a time. Take short walks on level surfaces as soon as pain allows. Try to increase the length of time you walk each day. Do not stay in bed. Resting more than 1 or 2 days can delay your recovery. Do not avoid exercise or work. Your body is made to move. It is not dangerous to be active, even though your back may hurt. Your back will likely heal faster if you return to being active before your pain is gone. Over-the-counter medicines to reduce pain and inflammation are often the most helpful.  SEEK MEDICAL CARE IF: You have pain that is not relieved with rest or medicine. You have pain that does not improve in 1 week. You have new symptoms. You are generally not feeling well.  SEEK IMMEDIATE MEDICAL CARE IF: You have pain that radiates from your back into your legs. You develop new bowel or bladder control problems. You have unusual weakness or numbness in your arms or legs. You develop nausea or vomiting. You develop abdominal pain. You feel faint.

## 2021-08-31 NOTE — ED Provider Notes (Signed)
[name]   [id number] 08/31/21 Arrival Time: 1761  ASSESSMENT & PLAN:  1. Low back strain, initial encounter    Able to ambulate here and hemodynamically stable. No indication for imaging of back at this time given no trauma and normal neurological exam. Discussed.  Meds ordered this encounter  Medications   cyclobenzaprine (FLEXERIL) 10 MG tablet    Sig: Take 1 tablet by mouth 3 times daily as needed for muscle spasm. Warning: May cause drowsiness.    Dispense:  21 tablet    Refill:  0   meloxicam (MOBIC) 15 MG tablet    Sig: Take 1 tablet (15 mg total) by mouth daily.    Dispense:  7 tablet    Refill:  0   Work/school excuse note: provided. Medication sedation precautions given. Encourage ROM/movement as tolerated.  Recommend:   Reviewed expectations re: course of current medical issues. Questions answered. Outlined signs and symptoms indicating need for more acute intervention. Patient verbalized understanding. After Visit Summary given.   SUBJECTIVE: History from: patient.  Fernando Williams is a 60 y.o. male who presents with complaint of fairly persistent left sided lower back discomfort. Onset abrupt. First noted  today after lifting heavy object at work . No trauma. History of back problems requiring medical care: none reported. Pain described as aching and without radiation. Aggravating factors: certain movements and prolonged walking/standing. Alleviating factors: have not been identified. Progressive LE weakness or saddle anesthesia: none. Extremity sensation changes or weakness: none. Ambulatory without difficulty. Normal bowel/bladder habits: yes; without urinary retention. Normal PO intake without n/v. No associated abdominal pain/n/v. No tx PTA.  Reports no chronic steroid use, fevers, IV drug use, or recent back surgeries or procedures.   OBJECTIVE:  Vitals:   08/31/21 0913  BP: 107/68  Pulse: 74  Resp: 17  Temp: 98.4 F (36.9 C)   TempSrc: Oral  SpO2: 96%    General appearance: alert; no distress HEENT: [name]; AT Neck: supple with FROM; without midline tenderness CV: regular Lungs: unlabored respirations; speaks full sentences without difficulty Abdomen: soft, non-tender; non-distended Back: poorly localized tenderness to palpation over lumbar paraspinal musculature (L>R) ; FROM at waist; bruising: none; without midline tenderness Extremities: without edema; symmetrical without gross deformities; normal ROM of bilateral LE Skin: warm and dry Neurologic: normal gait; normal sensation and strength of bilateral LE Psychological: alert and cooperative; normal mood and affect   Allergies  Allergen Reactions   Erythromycin Nausea And Vomiting    "Oxygen level went down"   Penicillins Other (See Comments)    Did it involve swelling of the face/tongue/throat, SOB, or low BP? No Did it involve sudden or severe rash/hives, skin peeling, or any reaction on the inside of your mouth or nose? Unknown Did you need to seek medical attention at a hospital or doctor's office? Unknown When did it last happen? childhood      If all above answers are "NO", may proceed with cephalosporin use.  "[name]" - STATES THIS OCCURS WITH ALL  "[name]"    Past Medical History:  Diagnosis Date   CHF (congestive heart failure) ([name])    Diabetes mellitus without complication ([name])    Pneumonia    Social History   Socioeconomic History   Marital status: Single    Spouse name: Not on file   Number of children: Not on file   Years of education: Not on file   Highest education level: Not on file  Occupational History  Not on file  Tobacco Use   Smoking status: Former    Types: Cigars    Quit date: 04/07/2018    Years since quitting: 3.4   Smokeless tobacco: Never  Vaping Use   Vaping Use: Never used  Substance and Sexual Activity   Alcohol use: No   Drug use: No   Sexual activity: Yes    Birth  control/protection: None  Other Topics Concern   Not on file  Social History Narrative   Not on file   Social Determinants of Health   Financial Resource Strain: Not on file  Food Insecurity: Not on file  Transportation Needs: Not on file  Physical Activity: Not on file  Stress: Not on file  Social Connections: Not on file  Intimate Partner Violence: Not on file   Family History  Problem Relation Age of Onset   Diabetes Mother    Cancer Father    Past Surgical History:  Procedure Laterality Date   AMPUTATION TOE Right 01/28/2020   Procedure: Right Second toe amputation;  Surgeon: Wylene Simmer, MD;  Location: [facility];  Service: Orthopedics;  Laterality: Right;   FRACTURE SURGERY     RIGHT/LEFT HEART CATH AND CORONARY ANGIOGRAPHY N/A 07/21/2018   Procedure: RIGHT/LEFT HEART CATH AND CORONARY ANGIOGRAPHY;  Surgeon: Jolaine Artist, MD;  Location: [facility];  Service: Cardiovascular;  Laterality: N/A;      Vanessa Kick, MD 08/31/21 [phone]

## 2021-10-27 IMAGING — DX DG FOOT COMPLETE 3+V*R*
3 series · 3 of 3 positions shown · non-contrast
Comparison: None.

CLINICAL DATA: Swelling of right middle toe

EXAM:
RIGHT FOOT COMPLETE - 3+ VIEW

[foot ap]
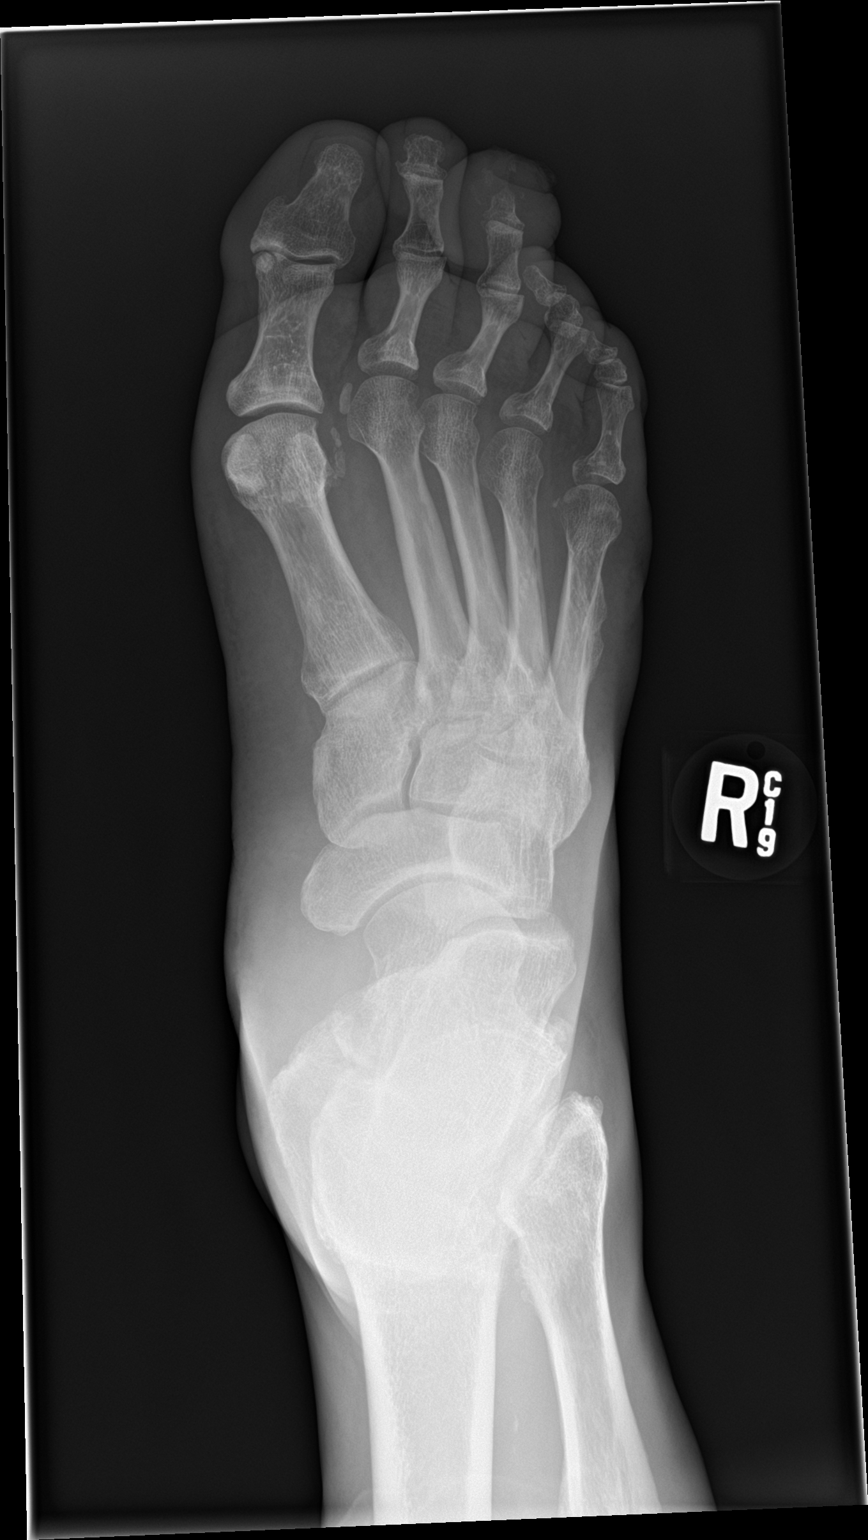

[foot obl]
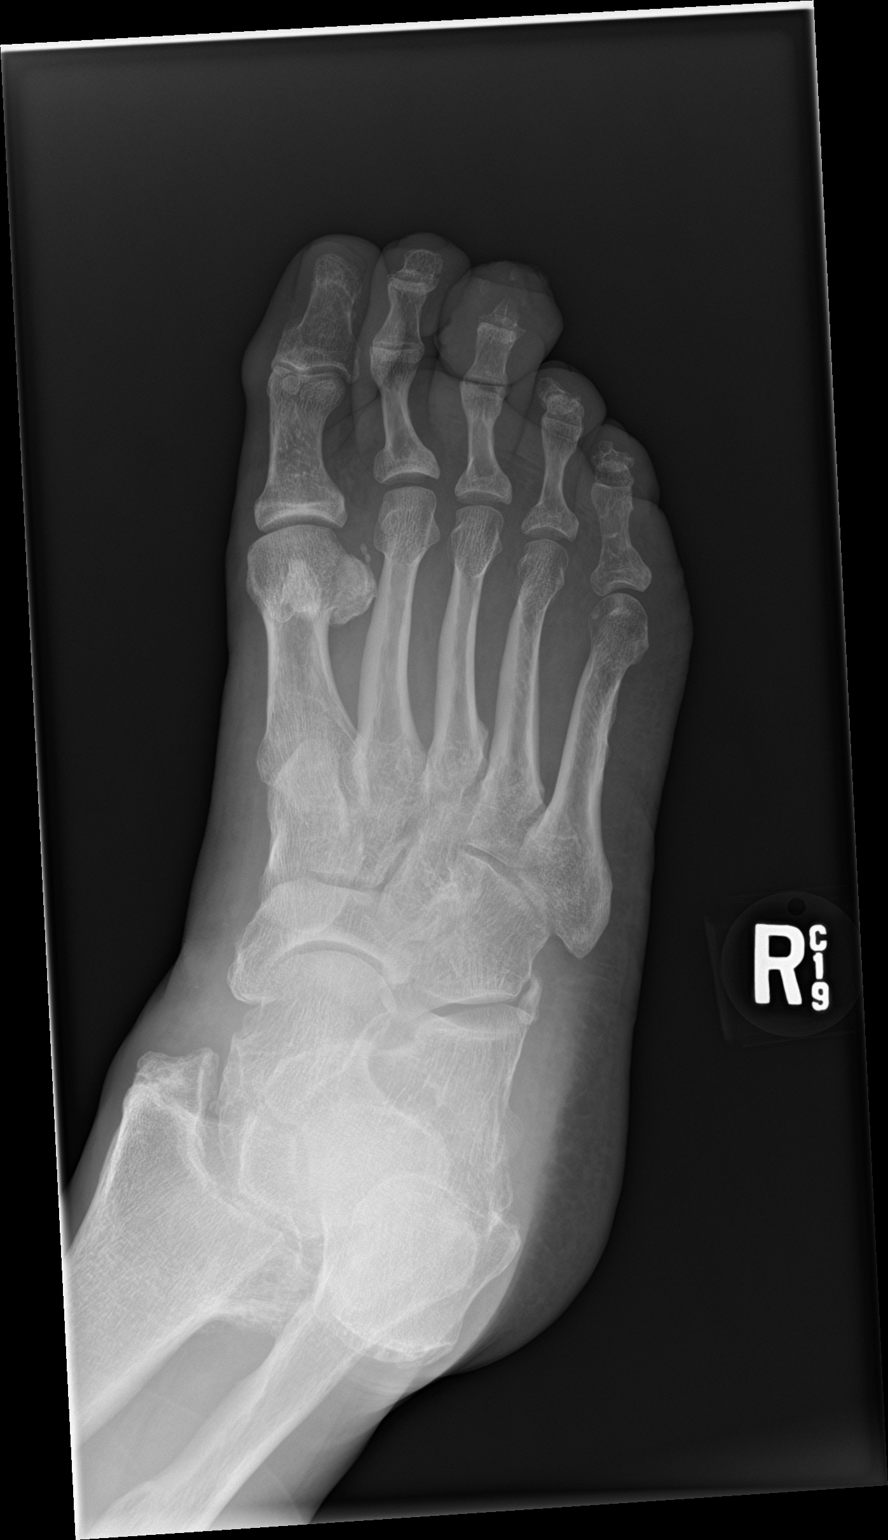

[foot lat]
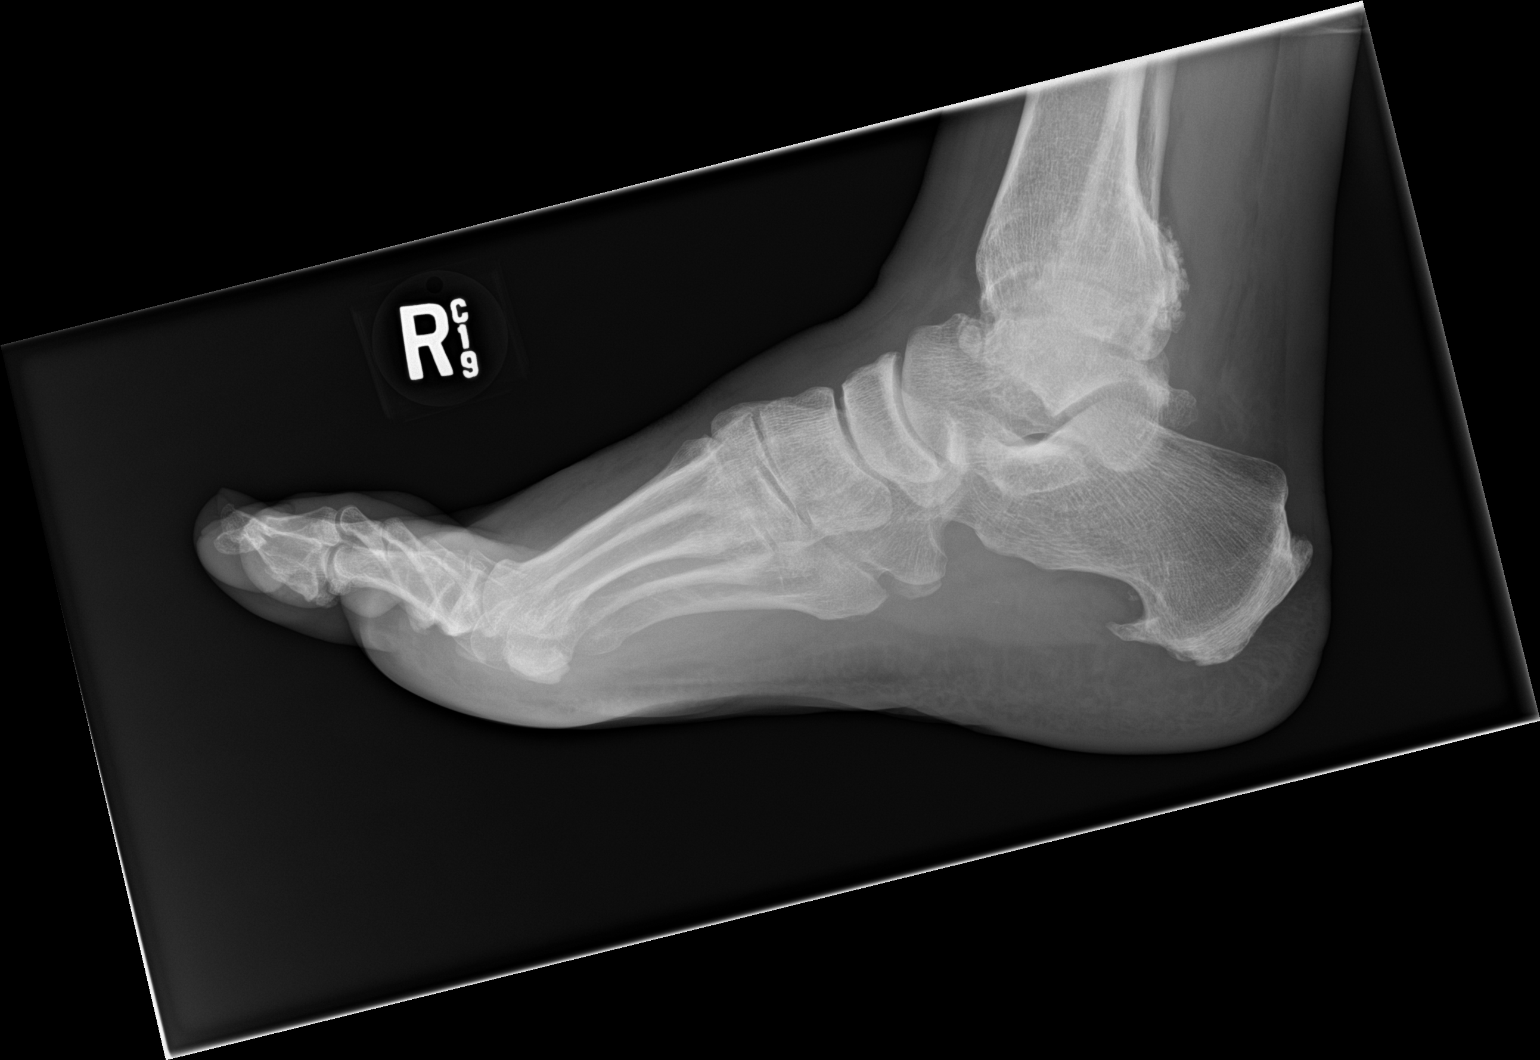

[3 of 3 positions shown; findings below may reference images not displayed]

FINDINGS: There is swelling of the right third digit. The third distal phalanx
is eroded. The DIP joint appears preserved. No additional
significant osseous abnormality.
IMPRESSION: Right third digit soft tissue swelling with erosion of the distal
phalanx suspicious for osteomyelitis.

## 2021-12-15 ENCOUNTER — Other Ambulatory Visit (HOSPITAL_COMMUNITY): Payer: Self-pay | Admitting: *Deleted

## 2021-12-15 MED ORDER — CARVEDILOL 12.5 MG PO TABS: 12.5000 mg | ORAL_TABLET | Freq: Two times a day (BID) | ORAL | 3 refills | Status: DC

## 2022-03-07 ENCOUNTER — Ambulatory Visit (HOSPITAL_BASED_OUTPATIENT_CLINIC_OR_DEPARTMENT_OTHER)
Admission: RE | Admit: 2022-03-07 | Discharge: 2022-03-07 | Disposition: A | Discharge status: Home / Self Care | Payer: BC Managed Care – PPO | Source: Ambulatory Visit | Attending: Internal Medicine | Admitting: Internal Medicine

## 2022-03-07 ENCOUNTER — Other Ambulatory Visit: Payer: Self-pay

## 2022-03-07 ENCOUNTER — Encounter (HOSPITAL_COMMUNITY): Payer: Self-pay | Admitting: Internal Medicine

## 2022-03-07 ENCOUNTER — Ambulatory Visit (HOSPITAL_COMMUNITY)
Admission: RE | Admit: 2022-03-07 | Discharge: 2022-03-07 | Disposition: A | Discharge status: Home / Self Care | Payer: BC Managed Care – PPO | Source: Ambulatory Visit | Attending: Family Medicine | Admitting: Family Medicine

## 2022-03-07 VITALS — BP 94/60 | HR 67 | Wt 217.6 lb

## 2022-03-07 DIAGNOSIS — Z6832 Body mass index (BMI) 32.0-32.9, adult: Secondary | ICD-10-CM | POA: Insufficient documentation

## 2022-03-07 DIAGNOSIS — I2510 Atherosclerotic heart disease of native coronary artery without angina pectoris: Secondary | ICD-10-CM | POA: Diagnosis not present

## 2022-03-07 DIAGNOSIS — E669 Obesity, unspecified: Secondary | ICD-10-CM | POA: Insufficient documentation

## 2022-03-07 DIAGNOSIS — Z7985 Long-term (current) use of injectable non-insulin antidiabetic drugs: Secondary | ICD-10-CM | POA: Diagnosis not present

## 2022-03-07 DIAGNOSIS — I5022 Chronic systolic (congestive) heart failure: Secondary | ICD-10-CM | POA: Diagnosis not present

## 2022-03-07 DIAGNOSIS — E875 Hyperkalemia: Secondary | ICD-10-CM | POA: Diagnosis not present

## 2022-03-07 DIAGNOSIS — I110 Hypertensive heart disease with heart failure: Secondary | ICD-10-CM | POA: Insufficient documentation

## 2022-03-07 DIAGNOSIS — Z79899 Other long term (current) drug therapy: Secondary | ICD-10-CM | POA: Diagnosis not present

## 2022-03-07 DIAGNOSIS — I428 Other cardiomyopathies: Secondary | ICD-10-CM | POA: Diagnosis not present

## 2022-03-07 DIAGNOSIS — Z87891 Personal history of nicotine dependence: Secondary | ICD-10-CM | POA: Insufficient documentation

## 2022-03-07 DIAGNOSIS — R9431 Abnormal electrocardiogram [ECG] [EKG]: Secondary | ICD-10-CM | POA: Insufficient documentation

## 2022-03-07 DIAGNOSIS — Z7984 Long term (current) use of oral hypoglycemic drugs: Secondary | ICD-10-CM | POA: Insufficient documentation

## 2022-03-07 DIAGNOSIS — Z7982 Long term (current) use of aspirin: Secondary | ICD-10-CM | POA: Diagnosis not present

## 2022-03-07 DIAGNOSIS — E11621 Type 2 diabetes mellitus with foot ulcer: Secondary | ICD-10-CM | POA: Diagnosis not present

## 2022-03-07 LAB — LIPID PANEL
Cholesterol: 185 mg/dL (ref 0–200)
HDL: 36 mg/dL — ABNORMAL LOW (ref >40)
LDL Cholesterol: 125 mg/dL — ABNORMAL HIGH (ref 0–99)
Total CHOL/HDL Ratio: 5.1 RATIO
Triglycerides: 122 mg/dL (ref <150)
VLDL: 24 mg/dL (ref 0–40)

## 2022-03-07 LAB — COMPREHENSIVE METABOLIC PANEL
ALT: 24 U/L (ref 0–44)
AST: 22 U/L (ref 15–41)
Albumin: 4 g/dL (ref 3.5–5.0)
Alkaline Phosphatase: 49 U/L (ref 38–126)
Anion gap: 8 (ref 5–15)
BUN: 23 mg/dL (ref 8–23)
CO2: 25 mmol/L (ref 22–32)
Calcium: 9.3 mg/dL (ref 8.9–10.3)
Chloride: 102 mmol/L (ref 98–111)
Creatinine, Ser: 1.14 mg/dL (ref 0.61–1.24)
GFR, Estimated: >60 mL/min (ref >60)
Glucose, Bld: 94 mg/dL (ref 70–99)
Potassium: 4.5 mmol/L (ref 3.5–5.1)
Sodium: 135 mmol/L (ref 135–145)
Total Bilirubin: 0.9 mg/dL (ref 0.3–1.2)
Total Protein: 7 g/dL (ref 6.5–8.1)

## 2022-03-07 LAB — ECHOCARDIOGRAM COMPLETE
Area-P 1/2: 2.6 cm2
Calc EF: 48.5 %
MV M vel: 2.62 m/s
MV Peak grad: 27.5 mmHg
S' Lateral: 3.5 cm
Single Plane A2C EF: 44.3 %
Single Plane A4C EF: 52.9 %

## 2022-03-07 MED ORDER — ROSUVASTATIN CALCIUM 10 MG PO TABS: 10.0000 mg | ORAL_TABLET | Freq: Every day | ORAL | 3 refills | Status: DC

## 2022-03-07 NOTE — Patient Instructions (Signed)
START Crestor 10 mg daily.  Labs done today, your results will be available in MyChart, we will contact you for abnormal readings.  Your physician has requested that you have an echocardiogram. Echocardiography is a painless test that uses sound waves to create images of your heart. It provides your doctor with information about the size and shape of your heart and how well your heart's chambers and valves are working. This procedure takes approximately one hour. There are no restrictions for this procedure. Please do NOT wear cologne, perfume, aftershave, or lotions (deodorant is allowed). Please arrive 15 minutes prior to your appointment time.  Your physician recommends that you schedule a follow-up appointment in: 1 year with an echocardiogram ( February 2025) ** please call the office in December to arrange your follow up appointment. **  If you have any questions or concerns before your next appointment please send [place] a message through [place] or call our office at [phone].    TO LEAVE A MESSAGE FOR THE NURSE SELECT OPTION 2, PLEASE LEAVE A MESSAGE INCLUDING: YOUR NAME DATE OF BIRTH CALL BACK NUMBER REASON FOR CALL**this is important as we prioritize the call backs  YOU WILL RECEIVE A CALL BACK THE SAME DAY AS LONG AS YOU CALL BEFORE 4:00 PM  At the [facility], you and your health needs are our priority. As part of our continuing mission to provide you with exceptional heart care, we have created designated Provider Care Teams. These Care Teams include your primary Cardiologist (physician) and Advanced Practice Providers (APPs- Physician Assistants and Nurse Practitioners) who all work together to provide you with the care you need, when you need it.   You may see any of the following providers on your designated Care Team at your next follow up: Dr Glori Bickers Dr Loralie Champagne Dr. Roxana Hires, NP Lyda Jester, [place] [facility] [place], [place] Forestine Na, NP Audry Riles, PharmD   Please be sure to bring in all your medications bottles to every appointment.    Thank you for choosing [facility]

## 2022-03-07 NOTE — Progress Notes (Signed)
ADVANCED HF CLINIC NOTE PCP: Celene Squibb, MD HF Cardiologist: Dr Haroldine Laws   HPI: Fernando Williams is a 62 y.o.. male with hx of tobacco abuse, SOB, obesity and systolic HF    In [id number] admitted with acute systolic heart failure. Diuresed > 20 pounds and underwent cath that showed NICM, min obstructive cad, and mild-mod elevated filling pressures.cMRI completed  LVEF 17% , RV EF 28% and LGE in basal septum with possible viral myocarditis. HF medications initiated.   We have not seen him since 4/21. In 1/22 underwent right 21m toe amputation for nonhealing diabetic ulcer and osteomyelitis  Follow up 2/23, stable NYHA I. EF 40-45%, suspect some scarring from myocarditis.  Today he returns for HF follow up. Overall feeling fine. He does not have SOB with activity. Works full time working on [employer]. Denies palpitations, abnormal bleeding, CP, dizziness, edema, or PND/Orthopnea. Appetite ok. No fever or chills. Weight at home 207 pounds. Taking all medications. Stopped smoking cigars since 2020, no ETOH or drugs.  Echo today 03/07/22, EF 50-55%  Cardiac Studies - Echo (8/22): EF 40-45% with inferior HK   - Echo (4/21): EF 40-45% RV normal   - Echo (12/20): EF 25-30%  - Echo (7/20): EF 15-20%  - CMRI (07/23/2018)  1.  Severely dilated LV with EF 17%, diffuse hypokinesis.  2. Mildly dilated RV with EF 28%, moderate to severe systolic dysfunction.  3. Primarily mid-wall LGE in the basal septum, but also rim of subendocardial LGE in the basal inferolateral wall. This is probably a non-coronary pattern, possible prior viral myocarditis.  ROS: All systems negative except as listed in HPI, PMH and Problem List.  SH:  Social History   Socioeconomic History   Marital status: Single    Spouse name: Not on file   Number of children: Not on file   Years of education: Not on file   Highest education level: Not on file  Occupational History   Not on file  Tobacco Use   Smoking status:  Former    Types: Cigars    Quit date: 04/07/2018    Years since quitting: 3.9   Smokeless tobacco: Never  Vaping Use   Vaping Use: Never used  Substance and Sexual Activity   Alcohol use: No   Drug use: No   Sexual activity: Yes    Birth control/protection: None  Other Topics Concern   Not on file  Social History Narrative   Not on file   Social Determinants of Health   Financial Resource Strain: Not on file  Food Insecurity: Not on file  Transportation Needs: Not on file  Physical Activity: Not on file  Stress: Not on file  Social Connections: Not on file  Intimate Partner Violence: Not on file   FH:  Family History  Problem Relation Age of Onset   Diabetes Mother    Cancer Father    Past Medical History:  Diagnosis Date   CHF (congestive heart failure) ([place]    Diabetes mellitus without complication (HCC)    Pneumonia    Current Outpatient Medications  Medication Sig Dispense Refill   aspirin EC 81 MG EC tablet Take 1 tablet (81 mg total) by mouth daily. 30 tablet 6   carvedilol (COREG) 12.5 MG tablet Take 1 tablet (12.5 mg total) by mouth 2 (two) times daily with a meal. 180 tablet 3   ENTRESTO 97-103 MG TAKE 1 TABLET BY MOUTH TWICE DAILY 180 tablet 3   JARDIANCE  25 MG TABS tablet Take 25 mg by mouth daily.     ONETOUCH VERIO test strip USE TO CHECK BLOOD SUGAR TWICE DAILY     Semaglutide (RYBELSUS) 14 MG TABS Take 14 mg by mouth daily in the afternoon.     No current facility-administered medications for this encounter.   BP 94/60   Pulse 67   Wt 98.7 kg (217 lb 9.6 oz)   SpO2 98%   BMI 32.13 kg/m   Wt Readings from Last 3 Encounters:  03/07/22 98.7 kg (217 lb 9.6 oz)  03/09/21 98.3 kg (216 lb 12.8 oz)  09/06/20 105.8 kg (233 lb 3.2 oz)   PHYSICAL EXAM: General:  NAD. No resp difficulty, walked into clinic HEENT: Normal Neck: Supple. No JVD, thick neck. Carotids 2+ bilat; no bruits. No lymphadenopathy or thryomegaly appreciated. Cor: PMI  nondisplaced. Regular rate & rhythm. No rubs, gallops or murmurs. Lungs: Clear Abdomen: Soft, nontender, nondistended. No hepatosplenomegaly. No bruits or masses. Good bowel sounds. Extremities: No cyanosis, clubbing, rash, edema Neuro: Alert & oriented x 3, cranial nerves grossly intact. Moves all 4 extremities w/o difficulty. Affect pleasant.  ECG (personally reviewed): NSR 67 bpm  ASSESSMENT & PLAN:  1. Chronic Systolic HF with biventricular dysfunction - Echo 07/16/18 EF 15-20%  Restricitve filling pattern   RVEF mild to moderately depressed  There was moderate pericardial effusion   - RHC/LHC (7/20): mild non obs CAD, severe NICM, elevated filling pressures.  - CMRI (7/20): LVEF 17% , RV EF 28% and LGE in basal septum with possible viral myocarditis.  - Echo (12/20): EF 30-35% - Echo (4/21): EF 40-45% - Echo (8/22): EF 40-45% with inferior HK, EF seems to be stuck at 40-45%. Suspect he has some scarring from myocarditis. - Echo today 03/07/22, EF 50-55% - Symptomatically stable NYHA I, volume looks good. - off spiro with hyperK. - Continue Coreg 12.5 mg bid.  No BP room to increase.  - Continue Entresto 97/103 mg bid - Continue Jardiance 25 mg daily. - Labs today.  2. DM II - On Jardiance, no GU symptoms. - Followed by PCP  3. CAD - Nonobstructive CAD . Mid LAD 40% stenoses, Prox RCA, Mid RCA 20%, Prox CX-Min CX 30 %.  - No s/s ischemia. - Continue SGLT2i and aspirin. - LDL 122 (2/23)-->  Start Crestor 10  - Goal LDL < 70 - Check lipids today.  4. Hyperkalemia - off spiro. - Labs today  Follow up in 1 years with Dr. Haroldine Laws + echo.  Maricela Bo Milford FNP-BC 12:21 PM  Patient seen and examined with the above-signed Advanced Practice Provider and/or Housestaff. I personally reviewed laboratory data, imaging studies and relevant notes. I independently examined the patient and formulated the important aspects of the plan. I have edited the note to reflect any of my  changes or salient points. I have personally discussed the plan with the patient and/or family.  Doing very well. NYHA I. Echo today reviewed personally EF 50-55%. Tolerating GDMT well.   General:  Well appearing. No resp difficulty HEENT: normal Neck: supple. no JVD. Carotids 2+ bilat; no bruits. No lymphadenopathy or thryomegaly appreciated. Cor: PMI nondisplaced. Regular rate & rhythm. No rubs, gallops or murmurs. Lungs: clear Abdomen: soft, nontender, nondistended. No hepatosplenomegaly. No bruits or masses. Good bowel sounds. Extremities: no cyanosis, clubbing, rash, edema Neuro: alert & orientedx3, cranial nerves grossly intact. moves all 4 extremities w/o difficulty. Affect pleasant  Doing well. Ef improved. On excellent GDMT. Labs today.  Glori Bickers, MD  4:13 PM

## 2022-04-02 ENCOUNTER — Other Ambulatory Visit (HOSPITAL_COMMUNITY): Payer: Self-pay | Admitting: Cardiology

## 2022-04-02 MED ORDER — ENTRESTO 97-103 MG PO TABS: 1.0000 | ORAL_TABLET | Freq: Two times a day (BID) | ORAL | 11 refills | Status: DC

## 2022-04-09 ENCOUNTER — Other Ambulatory Visit (HOSPITAL_COMMUNITY): Payer: Self-pay

## 2022-04-09 ENCOUNTER — Other Ambulatory Visit: Payer: Self-pay | Admitting: *Deleted

## 2022-04-09 ENCOUNTER — Telehealth (HOSPITAL_COMMUNITY): Payer: Self-pay

## 2022-04-09 MED ORDER — ENTRESTO 97-103 MG PO TABS: 1.0000 | ORAL_TABLET | Freq: Two times a day (BID) | ORAL | 11 refills | Status: DC

## 2022-04-09 NOTE — Telephone Encounter (Signed)
Patient Advocate Encounter  Prior authorization for [facility] submitted and APPROVED on 04/09/22. Test billing returns $45 copay for 30 day supply.  Key [id number] Effective: 04/09/22 - 04/07/25  Clista Bernhardt, CPhT Rx Patient Advocate Phone: [phone]

## 2022-04-30 DIAGNOSIS — E1165 Type 2 diabetes mellitus with hyperglycemia: Secondary | ICD-10-CM | POA: Diagnosis not present

## 2022-05-04 DIAGNOSIS — E782 Mixed hyperlipidemia: Secondary | ICD-10-CM | POA: Diagnosis not present

## 2022-05-04 DIAGNOSIS — E875 Hyperkalemia: Secondary | ICD-10-CM | POA: Diagnosis not present

## 2022-05-04 DIAGNOSIS — E1165 Type 2 diabetes mellitus with hyperglycemia: Secondary | ICD-10-CM | POA: Diagnosis not present

## 2022-05-04 DIAGNOSIS — I5022 Chronic systolic (congestive) heart failure: Secondary | ICD-10-CM | POA: Diagnosis not present

## 2022-05-04 DIAGNOSIS — T466X5A Adverse effect of antihyperlipidemic and antiarteriosclerotic drugs, initial encounter: Secondary | ICD-10-CM | POA: Diagnosis not present

## 2022-08-08 DIAGNOSIS — E1165 Type 2 diabetes mellitus with hyperglycemia: Secondary | ICD-10-CM | POA: Diagnosis not present

## 2022-08-14 DIAGNOSIS — E782 Mixed hyperlipidemia: Secondary | ICD-10-CM | POA: Diagnosis not present

## 2022-08-14 DIAGNOSIS — I5022 Chronic systolic (congestive) heart failure: Secondary | ICD-10-CM | POA: Diagnosis not present

## 2022-08-14 DIAGNOSIS — E875 Hyperkalemia: Secondary | ICD-10-CM | POA: Diagnosis not present

## 2022-08-14 DIAGNOSIS — T466X5A Adverse effect of antihyperlipidemic and antiarteriosclerotic drugs, initial encounter: Secondary | ICD-10-CM | POA: Diagnosis not present

## 2022-08-14 DIAGNOSIS — E1165 Type 2 diabetes mellitus with hyperglycemia: Secondary | ICD-10-CM | POA: Diagnosis not present

## 2022-09-07 ENCOUNTER — Other Ambulatory Visit (HOSPITAL_COMMUNITY): Payer: Self-pay | Admitting: Internal Medicine

## 2022-11-27 ENCOUNTER — Other Ambulatory Visit (HOSPITAL_COMMUNITY): Payer: Self-pay | Admitting: Internal Medicine

## 2022-12-11 DIAGNOSIS — Z125 Encounter for screening for malignant neoplasm of prostate: Secondary | ICD-10-CM | POA: Diagnosis not present

## 2022-12-11 DIAGNOSIS — E1165 Type 2 diabetes mellitus with hyperglycemia: Secondary | ICD-10-CM | POA: Diagnosis not present

## 2022-12-18 DIAGNOSIS — E875 Hyperkalemia: Secondary | ICD-10-CM | POA: Diagnosis not present

## 2022-12-18 DIAGNOSIS — E782 Mixed hyperlipidemia: Secondary | ICD-10-CM | POA: Diagnosis not present

## 2022-12-18 DIAGNOSIS — E1165 Type 2 diabetes mellitus with hyperglycemia: Secondary | ICD-10-CM | POA: Diagnosis not present

## 2022-12-18 DIAGNOSIS — T466X5A Adverse effect of antihyperlipidemic and antiarteriosclerotic drugs, initial encounter: Secondary | ICD-10-CM | POA: Diagnosis not present

## 2023-01-15 DIAGNOSIS — Z23 Encounter for immunization: Secondary | ICD-10-CM | POA: Diagnosis not present

## 2023-03-25 ENCOUNTER — Other Ambulatory Visit (HOSPITAL_COMMUNITY): Payer: Self-pay | Admitting: Cardiology

## 2023-03-25 MED ORDER — CARVEDILOL 12.5 MG PO TABS: 12.5000 mg | ORAL_TABLET | Freq: Two times a day (BID) | ORAL | 1 refills | Status: DC

## 2023-03-25 NOTE — Telephone Encounter (Signed)
 Patient called to request updated RX per pharmacy app notification. -RX sent  Pt also aware to schedule follow Will call back in two weeks once schedule open

## 2023-04-04 ENCOUNTER — Other Ambulatory Visit (HOSPITAL_COMMUNITY): Payer: Self-pay | Admitting: Cardiology

## 2023-04-04 MED ORDER — ENTRESTO 97-103 MG PO TABS: 1.0000 | ORAL_TABLET | Freq: Two times a day (BID) | ORAL | 11 refills | Status: DC

## 2023-04-12 DIAGNOSIS — Z125 Encounter for screening for malignant neoplasm of prostate: Secondary | ICD-10-CM | POA: Diagnosis not present

## 2023-04-12 DIAGNOSIS — E1165 Type 2 diabetes mellitus with hyperglycemia: Secondary | ICD-10-CM | POA: Diagnosis not present

## 2023-04-18 DIAGNOSIS — E875 Hyperkalemia: Secondary | ICD-10-CM | POA: Diagnosis not present

## 2023-04-18 DIAGNOSIS — E669 Obesity, unspecified: Secondary | ICD-10-CM | POA: Diagnosis not present

## 2023-04-18 DIAGNOSIS — Z0001 Encounter for general adult medical examination with abnormal findings: Secondary | ICD-10-CM | POA: Diagnosis not present

## 2023-04-18 DIAGNOSIS — T466X5A Adverse effect of antihyperlipidemic and antiarteriosclerotic drugs, initial encounter: Secondary | ICD-10-CM | POA: Diagnosis not present

## 2023-04-18 DIAGNOSIS — E1165 Type 2 diabetes mellitus with hyperglycemia: Secondary | ICD-10-CM | POA: Diagnosis not present

## 2023-04-18 DIAGNOSIS — E782 Mixed hyperlipidemia: Secondary | ICD-10-CM | POA: Diagnosis not present

## 2023-08-23 NOTE — Progress Notes (Signed)
 [facility] NOTE PCP: Shona Norleen PEDLAR, MD HF Cardiologist: Dr Cherrie   Reason for Visit: F/u for HF (HFrEF>>HFimEF)  HPI: Fernando Williams is a 63 y.o.. male with hx of tobacco abuse, SOB, obesity and systolic HF    In 7/20 admitted with acute systolic heart failure. Diuresed > 20 pounds and underwent cath that showed NICM, min obstructive cad, and mild-mod elevated filling pressures. cMRI completed  LVEF 17%, RV EF 28% and LGE in basal septum with possible viral myocarditis. HF medications initiated.   In 1/22 underwent right 2nd toe amputation for nonhealing diabetic ulcer and osteomyelitis.  Follow up 2/23, stable NYHA I. EF 40-45%, suspect some scarring from myocarditis.  Echo 2/24 EF improved, 50-55%, RV normal.   He presents today for his annal f/u. Doing well. No complaints. Denies exertional CP or dyspnea w/ physical activity. NYHA I. No LEE, orthopnea or PND. Reports full med compliance. Tolerating well. BP controlled, 102/62. No orthostatic symptoms.   No longer smoking cigarettes.     Cardiac Studies - Echo (8/22): EF 40-45% with inferior HK   - Echo (4/21): EF 40-45% RV normal   - Echo (12/20): EF 25-30%  - Echo (7/20): EF 15-20%  - CMRI (07/23/2018)  1.  Severely dilated LV with EF 17%, diffuse hypokinesis.  2. Mildly dilated RV with EF 28%, moderate to severe systolic dysfunction.  3. Primarily mid-wall LGE in the basal septum, but also rim of subendocardial LGE in the basal inferolateral wall. This is probably a non-coronary pattern, possible prior viral myocarditis.  ROS: All systems negative except as listed in HPI, PMH and Problem List.  SH:  Social History   Socioeconomic History   Marital status: Single    Spouse name: Not on file   Number of children: Not on file   Years of education: Not on file   Highest education level: Not on file  Occupational History   Not on file  Tobacco Use   Smoking status: Former    Types: Cigars    Quit  date: 04/07/2018    Years since quitting: 5.3   Smokeless tobacco: Never  Vaping Use   Vaping status: Never Used  Substance and Sexual Activity   Alcohol use: No   Drug use: No   Sexual activity: Yes    Birth control/protection: None  Other Topics Concern   Not on file  Social History Narrative   Not on file   Social Drivers of Health   Financial Resource Strain: Not on file  Food Insecurity: Not on file  Transportation Needs: Not on file  Physical Activity: Not on file  Stress: Not on file  Social Connections: Not on file  Intimate Partner Violence: Not on file   FH:  Family History  Problem Relation Age of Onset   Diabetes Mother    Cancer Father    Past Medical History:  Diagnosis Date   CHF (congestive heart failure) (HCC)    Diabetes mellitus without complication (HCC)    Pneumonia    Current Outpatient Medications  Medication Sig Dispense Refill   aspirin  EC 81 MG EC tablet Take 1 tablet (81 mg total) by mouth daily. 30 tablet 6   carvedilol  (COREG ) 12.5 MG tablet Take 1 tablet (12.5 mg total) by mouth 2 (two) times daily with a meal. 180 tablet 1   JARDIANCE  25 MG TABS tablet Take 25 mg by mouth daily.     ONETOUCH VERIO test strip USE TO  CHECK BLOOD SUGAR TWICE DAILY     rosuvastatin  (CRESTOR ) 10 MG tablet TAKE 1 TABLET(10 MG) BY MOUTH DAILY 90 tablet 3   sacubitril -valsartan  (ENTRESTO ) 97-103 MG Take 1 tablet by mouth 2 (two) times daily. 60 tablet 11   Semaglutide (RYBELSUS) 14 MG TABS Take 14 mg by mouth daily in the afternoon.     No current facility-administered medications for this encounter.   BP 102/62   Pulse 72   Ht 5' 9 (1.753 m)   Wt 95.9 kg (211 lb 6.4 oz)   SpO2 95%   BMI 31.22 kg/m   Wt Readings from Last 3 Encounters:  08/27/23 95.9 kg (211 lb 6.4 oz)  03/07/22 98.7 kg (217 lb 9.6 oz)  03/09/21 98.3 kg (216 lb 12.8 oz)   Vitals:   08/27/23 0859  BP: 102/62  Pulse: 72  SpO2: 95%   PHYSICAL EXAM  GENERAL: NAD Lungs-  clear CARDIAC:  JVP: not elevated         Normal rate with regular rhythm. No murmurs.  No edema.  ABDOMEN: Soft, non-tender, non-distended.  EXTREMITIES: Warm and well perfused.  NEUROLOGIC: No obvious FND   ECG (personally reviewed): not performed    ASSESSMENT & PLAN:  1. Chronic Systolic HF with biventricular dysfunction>>HFimEF  - Echo 07/16/18 EF 15-20%  Restricitve filling pattern   RVEF mild to moderately depressed  There was moderate pericardial effusion   - RHC/LHC (7/20): mild non obs CAD, severe NICM, elevated filling pressures.  - CMRI (7/20): LVEF 17% , RV EF 28% and LGE in basal septum with possible viral myocarditis  - Echo (12/20): EF 30-35% - Echo (4/21): EF 40-45% - Echo (8/22): EF 40-45% with inferior HK, EF seems to be stuck at 40-45%. Suspect he has some scarring from myocarditis - Echo (2/24): EF 50-55%, RV nl  - NYHA I. Volume good, euvolemic   - Continue Entresto  97-103 mg bid  - Continue Jardiance  10 mg daily  - Not on MRA given h/o hyperkalemia - Continue Coreg  12.5 mg bid  - Check BMP today  - Repeat echo in 1 yr   2. DM II - followed by PCP - on SGLT2i, Jardiance   - on Semaglutide (Rebelsus)   3. CAD - Nonobstructive CAD . Mid LAD 40% stenoses, Prox RCA and Mid RCA w/ 20% stenosis, Prox CX-Mid CX 30 %.  - stable w/o CP   - Continue ASA + statin + ? blocker - Goal LDL < 70 - Check LP and HFTs   F/u in 1 year w/ echo w/ Dr. Cherrie Caffie Shed PA-C  9:04 AM

## 2023-08-26 ENCOUNTER — Telehealth (HOSPITAL_COMMUNITY): Payer: Self-pay

## 2023-08-26 NOTE — Telephone Encounter (Signed)
 Called to confirm/remind patient of their appointment at the [facility] on 08/27/23 9:00.   Appointment:   [x] Confirmed  [] Left mess   [] No answer/No voice mail  [] VM Full/unable to leave message  [] Phone not in service  Patient reminded to bring all medications and/or complete list.  Confirmed patient has transportation. Gave directions, instructed to utilize valet parking.

## 2023-08-27 ENCOUNTER — Encounter (HOSPITAL_COMMUNITY): Payer: Self-pay

## 2023-08-27 ENCOUNTER — Ambulatory Visit (HOSPITAL_COMMUNITY)
Admission: RE | Admit: 2023-08-27 | Discharge: 2023-08-27 | Disposition: A | Discharge status: Home / Self Care | Source: Ambulatory Visit | Attending: Cardiology | Admitting: Cardiology

## 2023-08-27 ENCOUNTER — Ambulatory Visit (HOSPITAL_COMMUNITY): Payer: Self-pay | Admitting: Cardiology

## 2023-08-27 VITALS — BP 102/62 | HR 72 | Ht 69.0 in | Wt 211.4 lb

## 2023-08-27 DIAGNOSIS — I3139 Other pericardial effusion (noninflammatory): Secondary | ICD-10-CM | POA: Insufficient documentation

## 2023-08-27 DIAGNOSIS — I428 Other cardiomyopathies: Secondary | ICD-10-CM | POA: Diagnosis not present

## 2023-08-27 DIAGNOSIS — E669 Obesity, unspecified: Secondary | ICD-10-CM | POA: Insufficient documentation

## 2023-08-27 DIAGNOSIS — I5022 Chronic systolic (congestive) heart failure: Secondary | ICD-10-CM | POA: Diagnosis not present

## 2023-08-27 DIAGNOSIS — I2510 Atherosclerotic heart disease of native coronary artery without angina pectoris: Secondary | ICD-10-CM | POA: Insufficient documentation

## 2023-08-27 DIAGNOSIS — Z79899 Other long term (current) drug therapy: Secondary | ICD-10-CM | POA: Diagnosis not present

## 2023-08-27 DIAGNOSIS — Z87891 Personal history of nicotine dependence: Secondary | ICD-10-CM | POA: Insufficient documentation

## 2023-08-27 DIAGNOSIS — Z7982 Long term (current) use of aspirin: Secondary | ICD-10-CM | POA: Insufficient documentation

## 2023-08-27 DIAGNOSIS — Z6831 Body mass index (BMI) 31.0-31.9, adult: Secondary | ICD-10-CM | POA: Diagnosis not present

## 2023-08-27 DIAGNOSIS — Z7984 Long term (current) use of oral hypoglycemic drugs: Secondary | ICD-10-CM | POA: Insufficient documentation

## 2023-08-27 DIAGNOSIS — E1169 Type 2 diabetes mellitus with other specified complication: Secondary | ICD-10-CM | POA: Diagnosis not present

## 2023-08-27 DIAGNOSIS — E785 Hyperlipidemia, unspecified: Secondary | ICD-10-CM | POA: Insufficient documentation

## 2023-08-27 LAB — COMPREHENSIVE METABOLIC PANEL WITH GFR
ALT: 27 U/L (ref 0–44)
AST: 23 U/L (ref 15–41)
Albumin: 3.8 g/dL (ref 3.5–5.0)
Alkaline Phosphatase: 50 U/L (ref 38–126)
Anion gap: 7 (ref 5–15)
BUN: 19 mg/dL (ref 8–23)
CO2: 27 mmol/L (ref 22–32)
Calcium: 9.2 mg/dL (ref 8.9–10.3)
Chloride: 101 mmol/L (ref 98–111)
Creatinine, Ser: 1.19 mg/dL (ref 0.61–1.24)
GFR, Estimated: >60 mL/min (ref >60)
Glucose, Bld: 111 mg/dL — ABNORMAL HIGH (ref 70–99)
Potassium: 5.2 mmol/L — ABNORMAL HIGH (ref 3.5–5.1)
Sodium: 135 mmol/L (ref 135–145)
Total Bilirubin: 1 mg/dL (ref 0.0–1.2)
Total Protein: 7 g/dL (ref 6.5–8.1)

## 2023-08-27 LAB — LIPID PANEL
Cholesterol: 103 mg/dL (ref 0–200)
HDL: 38 mg/dL — ABNORMAL LOW (ref >40)
LDL Cholesterol: 54 mg/dL (ref 0–99)
Total CHOL/HDL Ratio: 2.7 ratio
Triglycerides: 55 mg/dL (ref <150)
VLDL: 11 mg/dL (ref 0–40)

## 2023-08-27 MED ORDER — SACUBITRIL-VALSARTAN 97-103 MG PO TABS: 1.0000 | ORAL_TABLET | Freq: Two times a day (BID) | ORAL | 11 refills | Status: AC

## 2023-08-27 MED ORDER — ROSUVASTATIN CALCIUM 10 MG PO TABS: 10.0000 mg | ORAL_TABLET | Freq: Every day | ORAL | 3 refills | Status: AC

## 2023-08-27 MED ORDER — CARVEDILOL 12.5 MG PO TABS: 12.5000 mg | ORAL_TABLET | Freq: Two times a day (BID) | ORAL | 1 refills | Status: AC

## 2023-08-27 NOTE — Patient Instructions (Signed)
 Medication Changes:  MEDICATIONS REFILLED   Lab Work:  Labs done today, your results will be available in MyChart, we will contact you for abnormal readings.  Follow-Up in: 1 YEAR WITH AN ECHO WITH DR. CHERRIE PLEASE CALL OUR OFFICE AROUND JULY OF 2026 TO GET SCHEDULED FOR YOUR APPOINTMENT. PHONE NUMBER IS [phone] OPTION 2   At the [facility], you and your health needs are our priority. We have a designated team specialized in the treatment of Heart Failure. This Care Team includes your primary Heart Failure Specialized Cardiologist (physician), Advanced Practice Providers (APPs- Physician Assistants and Nurse Practitioners), and Pharmacist who all work together to provide you with the care you need, when you need it.   You may see any of the following providers on your designated Care Team at your next follow up:  Dr. Toribio CHERRIE Dr. Ezra Shuck Dr. Ria Commander Dr. Odis Brownie Greig Mosses, NP Caffie Shed, [place] [facility], [place] Beckey Coe, NP [place] Lee, NP Tinnie Redman, PharmD   Please be sure to bring in all your medications bottles to every appointment.   Need to Contact Us :  If you have any questions or concerns before your next appointment please send us  a message through [place] or call our office at [phone].    TO LEAVE A MESSAGE FOR THE NURSE SELECT OPTION 2, PLEASE LEAVE A MESSAGE INCLUDING: YOUR NAME DATE OF BIRTH CALL BACK NUMBER REASON FOR CALL**this is important as we prioritize the call backs  YOU WILL RECEIVE A CALL BACK THE SAME DAY AS LONG AS YOU CALL BEFORE 4:00 PM

## 2023-10-10 DIAGNOSIS — Z125 Encounter for screening for malignant neoplasm of prostate: Secondary | ICD-10-CM | POA: Diagnosis not present

## 2023-10-17 DIAGNOSIS — E782 Mixed hyperlipidemia: Secondary | ICD-10-CM | POA: Diagnosis not present

## 2023-10-17 DIAGNOSIS — T466X5A Adverse effect of antihyperlipidemic and antiarteriosclerotic drugs, initial encounter: Secondary | ICD-10-CM | POA: Diagnosis not present

## 2023-10-17 DIAGNOSIS — I5022 Chronic systolic (congestive) heart failure: Secondary | ICD-10-CM | POA: Diagnosis not present

## 2023-10-17 DIAGNOSIS — E875 Hyperkalemia: Secondary | ICD-10-CM | POA: Diagnosis not present

## 2023-10-17 DIAGNOSIS — E1165 Type 2 diabetes mellitus with hyperglycemia: Secondary | ICD-10-CM | POA: Diagnosis not present

## 2024-02-07 ENCOUNTER — Other Ambulatory Visit (HOSPITAL_COMMUNITY): Payer: Self-pay

## 2024-02-07 ENCOUNTER — Telehealth (HOSPITAL_COMMUNITY): Payer: Self-pay

## 2024-02-07 NOTE — Telephone Encounter (Signed)
 Spoke to patient by phone, updated insurance information and confirmed this plan prefers generic. Test billing for sacubitril -valsartan  returns $15 copay for 30 days, $45 copay for 90 day supply. Patient expressed understanding.

## 2024-02-07 NOTE — Telephone Encounter (Signed)
 [facility] Patient Advocate Encounter  Patient contacted office with concerns regarding Enresto. Patient received a notification that the medication would not be covered going forward. Likely this plan now prefers generic to be filled.  I am unable to find any active coverage for this pt at this time in order to verify copays or perform test billing.  Left voicemail for pt to reach out with updated insurance information so that we can check pricing.  Rachel DEL, CPhT Rx Patient Advocate Phone: [phone]
